# Patient Record
Sex: Male | Born: 1985 | Race: White | Hispanic: No | Marital: Married | Smoking: Never smoker
Health system: Southern US, Community
[De-identification: ages and names within clinical notes are randomized; demographics above are authoritative.]

## PROBLEM LIST (undated history)

## (undated) DIAGNOSIS — J45909 Unspecified asthma, uncomplicated: Secondary | ICD-10-CM

## (undated) DIAGNOSIS — I8390 Asymptomatic varicose veins of unspecified lower extremity: Secondary | ICD-10-CM

## (undated) DIAGNOSIS — M5030 Other cervical disc degeneration, unspecified cervical region: Secondary | ICD-10-CM

## (undated) DIAGNOSIS — F419 Anxiety disorder, unspecified: Secondary | ICD-10-CM

## (undated) DIAGNOSIS — M1990 Unspecified osteoarthritis, unspecified site: Secondary | ICD-10-CM

## (undated) DIAGNOSIS — K625 Hemorrhage of anus and rectum: Secondary | ICD-10-CM

## (undated) DIAGNOSIS — M549 Dorsalgia, unspecified: Secondary | ICD-10-CM

## (undated) DIAGNOSIS — F909 Attention-deficit hyperactivity disorder, unspecified type: Secondary | ICD-10-CM

## (undated) HISTORY — PX: LEG SURGERY: SHX1003

## (undated) HISTORY — PX: OTHER SURGICAL HISTORY: SHX169

## (undated) HISTORY — DX: Unspecified asthma, uncomplicated: J45.909

## (undated) HISTORY — DX: Attention-deficit hyperactivity disorder, unspecified type: F90.9

## (undated) HISTORY — PX: TONSILECTOMY, ADENOIDECTOMY, BILATERAL MYRINGOTOMY AND TUBES: SHX2538

## (undated) HISTORY — DX: Hemorrhage of anus and rectum: K62.5

## (undated) HISTORY — DX: Anxiety disorder, unspecified: F41.9

## (undated) HISTORY — DX: Dorsalgia, unspecified: M54.9

## (undated) HISTORY — DX: Asymptomatic varicose veins of unspecified lower extremity: I83.90

## (undated) HISTORY — DX: Other cervical disc degeneration, unspecified cervical region: M50.30

## (undated) HISTORY — PX: SHOULDER SURGERY: SHX246

## (undated) HISTORY — DX: Unspecified osteoarthritis, unspecified site: M19.90

---

## 2008-08-08 DIAGNOSIS — E669 Obesity, unspecified: Secondary | ICD-10-CM | POA: Insufficient documentation

## 2009-03-28 ENCOUNTER — Ambulatory Visit: Payer: Self-pay | Admitting: Family Medicine

## 2009-05-10 DIAGNOSIS — G43009 Migraine without aura, not intractable, without status migrainosus: Secondary | ICD-10-CM | POA: Insufficient documentation

## 2009-05-10 DIAGNOSIS — H3400 Transient retinal artery occlusion, unspecified eye: Secondary | ICD-10-CM | POA: Insufficient documentation

## 2015-04-03 ENCOUNTER — Telehealth: Payer: Self-pay | Admitting: Family Medicine

## 2015-04-03 NOTE — Telephone Encounter (Signed)
He should have one rx left to be filled on June 24th, 2016

## 2015-04-04 ENCOUNTER — Other Ambulatory Visit: Payer: Self-pay | Admitting: Family Medicine

## 2015-04-04 MED ORDER — AMPHETAMINE-DEXTROAMPHETAMINE 10 MG PO TABS: 10.0000 mg | ORAL_TABLET | Freq: Every day | ORAL | Status: DC

## 2015-04-04 NOTE — Telephone Encounter (Signed)
Done, at your desk. Please notify patient, thank you

## 2015-04-04 NOTE — Telephone Encounter (Signed)
Patient states he just needs a refill of the 10 mg of Adderall that he takes on his long days of work. He informed me work still has him doing 10-12 shifts right now and ran out of his prescription from February 21, 2015.

## 2015-05-17 ENCOUNTER — Ambulatory Visit: Payer: Self-pay | Admitting: Family Medicine

## 2015-05-19 ENCOUNTER — Telehealth: Payer: Self-pay | Admitting: Family Medicine

## 2015-05-19 NOTE — Telephone Encounter (Signed)
Requesting refill on Adderall 10mg . Patient is completely out

## 2015-05-22 ENCOUNTER — Other Ambulatory Visit: Payer: Self-pay

## 2015-05-24 NOTE — Telephone Encounter (Signed)
Left vm, pt is to schedule an appt and be seen before Dr. Ancil Boozer will refill

## 2015-05-30 ENCOUNTER — Encounter: Payer: Self-pay | Admitting: Family Medicine

## 2015-05-30 DIAGNOSIS — G8929 Other chronic pain: Secondary | ICD-10-CM | POA: Insufficient documentation

## 2015-05-30 DIAGNOSIS — M549 Dorsalgia, unspecified: Secondary | ICD-10-CM

## 2015-05-30 DIAGNOSIS — I8390 Asymptomatic varicose veins of unspecified lower extremity: Secondary | ICD-10-CM | POA: Insufficient documentation

## 2015-05-30 DIAGNOSIS — I872 Venous insufficiency (chronic) (peripheral): Secondary | ICD-10-CM | POA: Insufficient documentation

## 2015-05-30 DIAGNOSIS — R760 Raised antibody titer: Secondary | ICD-10-CM | POA: Insufficient documentation

## 2015-05-30 DIAGNOSIS — F988 Other specified behavioral and emotional disorders with onset usually occurring in childhood and adolescence: Secondary | ICD-10-CM | POA: Insufficient documentation

## 2015-05-30 DIAGNOSIS — I83892 Varicose veins of left lower extremities with other complications: Secondary | ICD-10-CM | POA: Insufficient documentation

## 2015-05-30 HISTORY — DX: Asymptomatic varicose veins of unspecified lower extremity: I83.90

## 2015-06-01 ENCOUNTER — Ambulatory Visit: Payer: Self-pay | Admitting: Family Medicine

## 2015-06-02 ENCOUNTER — Ambulatory Visit (INDEPENDENT_AMBULATORY_CARE_PROVIDER_SITE_OTHER): Payer: BLUE CROSS/BLUE SHIELD | Admitting: Family Medicine

## 2015-06-02 ENCOUNTER — Encounter: Payer: Self-pay | Admitting: Family Medicine

## 2015-06-02 VITALS — BP 124/84 | HR 82 | Temp 98.5°F | Resp 16 | Ht 74.0 in | Wt 265.5 lb

## 2015-06-02 DIAGNOSIS — G8929 Other chronic pain: Secondary | ICD-10-CM | POA: Diagnosis not present

## 2015-06-02 DIAGNOSIS — F410 Panic disorder [episodic paroxysmal anxiety] without agoraphobia: Secondary | ICD-10-CM | POA: Diagnosis not present

## 2015-06-02 DIAGNOSIS — F988 Other specified behavioral and emotional disorders with onset usually occurring in childhood and adolescence: Secondary | ICD-10-CM

## 2015-06-02 DIAGNOSIS — M549 Dorsalgia, unspecified: Secondary | ICD-10-CM | POA: Diagnosis not present

## 2015-06-02 DIAGNOSIS — F909 Attention-deficit hyperactivity disorder, unspecified type: Secondary | ICD-10-CM

## 2015-06-02 DIAGNOSIS — F430 Acute stress reaction: Secondary | ICD-10-CM

## 2015-06-02 MED ORDER — AMPHETAMINE-DEXTROAMPHET ER 25 MG PO CP24: 25.0000 mg | ORAL_CAPSULE | ORAL | Status: DC

## 2015-06-02 MED ORDER — CYCLOBENZAPRINE HCL 10 MG PO TABS: 10.0000 mg | ORAL_TABLET | ORAL | Status: DC | PRN

## 2015-06-02 MED ORDER — ALPRAZOLAM 0.5 MG PO TABS: 0.5000 mg | ORAL_TABLET | Freq: Every evening | ORAL | Status: DC | PRN

## 2015-06-02 MED ORDER — AMPHETAMINE-DEXTROAMPHETAMINE 10 MG PO TABS: 10.0000 mg | ORAL_TABLET | Freq: Every day | ORAL | Status: DC

## 2015-06-02 NOTE — Progress Notes (Signed)
Name: Christian Hamilton   MRN: [id number]    DOB: 1986/10/09   Date:06/02/2015       Progress Note  Subjective  Chief Complaint  Chief Complaint  Patient presents with  . ADHD  . Anxiety  . Medication Refill    HPI    Back pain: doing better, he states he got orthotic shoes and also lifting better at work, taking Flexeril prn only, occasionally takes NSAID's, denies radiculitis.   ADD: he is taking Adderall XR in am's and Adderall 10 mg at lunch, doing well with focus at work and at home, denies side effects, no tics, no significant weight loss.  Panic attacks: seldom now, taking alprazolam about three times weekly for anxiety , denies side effects  Patient Active Problem List   Diagnosis Date Noted  . Panic attack as reaction to stress 06/02/2015  . ADD (attention deficit disorder) 05/30/2015  . Back pain, chronic 05/30/2015  . Chronic venous insufficiency 05/30/2015  . Abnormal antinuclear antibody titer 05/30/2015  . Leg varices 05/30/2015  . Migraine without aura and responsive to treatment 05/10/2009  . 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 Obesity (BMI 30-39.9) 08/08/2008    Past Surgical History  Procedure Laterality Date  . Shoulder surgery    . Leg surgery    . Tonsilectomy, adenoidectomy, bilateral myringotomy and tubes    . Tubes in ears      Family History  Problem Relation Age of Onset  . Kidney disease Father   . ADD / ADHD Brother     History   Social History  . Marital Status: Married    Spouse Name: N/A  . Number of Children: N/A  . Years of Education: N/A   Occupational History  . Not on file.   Social History Main Topics  . Smoking status: Never Smoker   . Smokeless tobacco: Never Used  . Alcohol Use: 0.0 oz/week    0 Standard drinks or equivalent per week  . Drug Use: No  . Sexual Activity: Yes   Other Topics Concern  . Not on file   Social History Narrative  . No narrative on file     Current outpatient  prescriptions:  .  cyclobenzaprine (FLEXERIL) 10 MG tablet, Take 1 tablet (10 mg total) by mouth as needed., Disp: 30 tablet, Rfl: 2 .  ALPRAZolam (XANAX) 0.5 MG tablet, , Disp: , Rfl: 0 .  amphetamine-dextroamphetamine (ADDERALL XR) 25 MG 24 hr capsule, Take 1 capsule by mouth every morning., Disp: 30 capsule, Rfl: 0 .  amphetamine-dextroamphetamine (ADDERALL XR) 25 MG 24 hr capsule, Take 1 capsule by mouth every morning., Disp: 30 capsule, Rfl: 0 .  amphetamine-dextroamphetamine (ADDERALL XR) 25 MG 24 hr capsule, Take 1 capsule by mouth every morning., Disp: 30 capsule, Rfl: 0 .  amphetamine-dextroamphetamine (ADDERALL) 10 MG tablet, Take 1 tablet (10 mg total) by mouth daily with lunch., Disp: 30 tablet, Rfl: 0 .  amphetamine-dextroamphetamine (ADDERALL) 10 MG tablet, Take 1 tablet (10 mg total) by mouth daily with lunch., Disp: 30 tablet, Rfl: 0 .  amphetamine-dextroamphetamine (ADDERALL) 10 MG tablet, Take 1 tablet (10 mg total) by mouth daily with lunch., Disp: 30 tablet, Rfl: 0  No Known Allergies   ROS  Constitutional: Negative for fever or significant weight change.  Respiratory: Negative for cough and shortness of breath.   Cardiovascular: Negative for chest pain or palpitations.  Gastrointestinal: Negative for abdominal pain, no bowel changes.  Musculoskeletal: Negative for gait problem or  joint swelling.  Skin: Negative for rash.  Neurological: Negative for dizziness or headache.  No other specific complaints in a complete review of systems (except as listed in HPI above).  Objective  Filed Vitals:   06/02/15 0826  BP: 124/84  Pulse: 82  Temp: 98.5 F (36.9 C)  TempSrc: Oral  Resp: 16  Height: 6\' 2"  (1.88 m)  Weight: 265 lb 8 oz (120.43 kg)  SpO2: 97%    Body mass index is 34.07 kg/(m^2).  Physical Exam Constitutional: Patient appears well-developed and well-nourished. Obese No distress.  Eyes:  No scleral icterus. PERL Neck: Normal range of motion. Neck  supple. Cardiovascular: Normal rate, regular rhythm and normal heart sounds.  No murmur heard. No BLE edema. Varicose veins on left leg, mild on the right side Pulmonary/Chest: Effort normal and breath sounds normal. No respiratory distress. Abdominal: Soft.  There is no tenderness. Psychiatric: Patient has a normal mood and affect. behavior is normal. Judgment and thought content normal. Muscular Skeletal: normal ROM and no tenderness during palpation of back today   PHQ2/9: Depression screen PHQ 2/9 06/02/2015  Decreased Interest 0  Down, Depressed, Hopeless 0  PHQ - 2 Score 0    Fall Risk: Fall Risk  06/02/2015  Falls in the past year? No    Assessment & Plan  1. ADD (attention deficit disorder) Doing well  - amphetamine-dextroamphetamine (ADDERALL) 10 MG tablet; Take 1 tablet (10 mg total) by mouth daily with lunch.  Dispense: 30 tablet; Refill: 0 - amphetamine-dextroamphetamine (ADDERALL) 10 MG tablet; Take 1 tablet (10 mg total) by mouth daily with lunch.  Dispense: 30 tablet; Refill: 0 - amphetamine-dextroamphetamine (ADDERALL) 10 MG tablet; Take 1 tablet (10 mg total) by mouth daily with lunch.  Dispense: 30 tablet; Refill: 0 - amphetamine-dextroamphetamine (ADDERALL XR) 25 MG 24 hr capsule; Take 1 capsule by mouth every morning.  Dispense: 30 capsule; Refill: 0 - amphetamine-dextroamphetamine (ADDERALL XR) 25 MG 24 hr capsule; Take 1 capsule by mouth every morning.  Dispense: 30 capsule; Refill: 0 - amphetamine-dextroamphetamine (ADDERALL XR) 25 MG 24 hr capsule; Take 1 capsule by mouth every morning.  Dispense: 30 capsule; Refill: 0  2. Back pain, chronic  - cyclobenzaprine (FLEXERIL) 10 MG tablet; Take 1 tablet (10 mg total) by mouth as needed.  Dispense: 30 tablet; Refill: 2  3. Panic attack as reaction to stress Continue prn medication

## 2015-07-04 ENCOUNTER — Telehealth: Payer: Self-pay | Admitting: Family Medicine

## 2015-07-04 NOTE — Telephone Encounter (Signed)
Patient states that Adderrall is needing authorization

## 2015-07-06 NOTE — Telephone Encounter (Signed)
PA is being processed by insurance, once we receive a determination, I will notify the patient. It its pending at this time.

## 2015-07-16 ENCOUNTER — Other Ambulatory Visit: Payer: Self-pay | Admitting: Family Medicine

## 2015-07-26 ENCOUNTER — Ambulatory Visit (INDEPENDENT_AMBULATORY_CARE_PROVIDER_SITE_OTHER): Payer: BLUE CROSS/BLUE SHIELD | Admitting: Family Medicine

## 2015-07-26 ENCOUNTER — Encounter: Payer: Self-pay | Admitting: Family Medicine

## 2015-07-26 VITALS — BP 132/86 | HR 96 | Temp 99.0°F | Resp 14 | Ht 74.0 in | Wt 266.1 lb

## 2015-07-26 DIAGNOSIS — Z0000 Encounter for general adult medical examination without abnormal findings: Secondary | ICD-10-CM

## 2015-07-26 DIAGNOSIS — E669 Obesity, unspecified: Secondary | ICD-10-CM

## 2015-07-26 DIAGNOSIS — Z23 Encounter for immunization: Secondary | ICD-10-CM

## 2015-07-26 DIAGNOSIS — Z79899 Other long term (current) drug therapy: Secondary | ICD-10-CM | POA: Diagnosis not present

## 2015-07-26 DIAGNOSIS — Z13220 Encounter for screening for lipoid disorders: Secondary | ICD-10-CM

## 2015-07-26 NOTE — Progress Notes (Signed)
Name: Christian Hamilton   MRN: [id number]    DOB: [birth year]   Date:07/26/2015       Progress Note  Subjective  Chief Complaint  Chief Complaint  Patient presents with  . Annual Exam    HPI  Annual Exam: feeling well, no chest pain, no SOB, no ED, no urinary frequency.   Patient Active Problem List   Diagnosis Date Noted  . Panic attack as reaction to stress 06/02/2015  . ADD (attention deficit disorder) 05/30/2015  . Back pain, chronic 05/30/2015  . Chronic venous insufficiency 05/30/2015  . Abnormal antinuclear antibody titer 05/30/2015  . Leg varices 05/30/2015  . Migraine without aura and responsive to treatment 05/10/2009  . 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 Obesity (BMI 30-39.9) 08/08/2008    Past Surgical History  Procedure Laterality Date  . Shoulder surgery    . Leg surgery    . Tonsilectomy, adenoidectomy, bilateral myringotomy and tubes    . Tubes in ears      Family History  Problem Relation Age of Onset  . Kidney disease Father   . ADD / ADHD Brother     Social History   Social History  . Marital Status: Married    Spouse Name: N/A  . Number of Children: N/A  . Years of Education: N/A   Occupational History  . Not on file.   Social History Main Topics  . Smoking status: Never Smoker   . Smokeless tobacco: Never Used  . Alcohol Use: 10.8 oz/week    0 Standard drinks or equivalent, 18 Cans of beer per week  . Drug Use: No  . Sexual Activity: Yes   Other Topics Concern  . Not on file   Social History Narrative     Current outpatient prescriptions:  .  ALPRAZolam (XANAX) 0.5 MG tablet, TAKE 1 TABLET BY MOUTH AT BEDTIME AS NEEDED FOR ANXIETY, Disp: 30 tablet, Rfl: 0 .  amphetamine-dextroamphetamine (ADDERALL XR) 25 MG 24 hr capsule, Take 1 capsule by mouth every morning., Disp: 30 capsule, Rfl: 0 .  amphetamine-dextroamphetamine (ADDERALL XR) 25 MG 24 hr capsule, Take 1 capsule by mouth every morning., Disp: 30 capsule, Rfl:  0 .  amphetamine-dextroamphetamine (ADDERALL XR) 25 MG 24 hr capsule, Take 1 capsule by mouth every morning., Disp: 30 capsule, Rfl: 0 .  amphetamine-dextroamphetamine (ADDERALL) 10 MG tablet, Take 1 tablet (10 mg total) by mouth daily with lunch., Disp: 30 tablet, Rfl: 0 .  amphetamine-dextroamphetamine (ADDERALL) 10 MG tablet, Take 1 tablet (10 mg total) by mouth daily with lunch., Disp: 30 tablet, Rfl: 0 .  amphetamine-dextroamphetamine (ADDERALL) 10 MG tablet, Take 1 tablet (10 mg total) by mouth daily with lunch., Disp: 30 tablet, Rfl: 0 .  cyclobenzaprine (FLEXERIL) 10 MG tablet, Take 1 tablet (10 mg total) by mouth as needed., Disp: 30 tablet, Rfl: 2  No Known Allergies   ROS  Constitutional: Negative for fever or weight change.  Respiratory: Negative for cough and shortness of breath.   Cardiovascular: Negative for chest pain or palpitations.  Gastrointestinal: Negative for abdominal pain, no bowel changes.  Musculoskeletal: Negative for gait problem or joint swelling.  Skin: Negative for rash.  Neurological: Negative for dizziness or headache.  No other specific complaints in a complete review of systems (except as listed in HPI above).  Objective  Filed Vitals:   07/26/15 1154 07/26/15 1226  BP: 132/86   Pulse: 127 96  Temp: 99 F (37.2 C)   TempSrc: Oral  Resp: 14   Height: 6\' 2"  (1.88 m)   Weight: 266 lb 1.6 oz (120.702 kg)   SpO2: 97%     Body mass index is 34.15 kg/(m^2).  Physical Exam  Constitutional: Patient appears well-developed and obese No distress.  HENT: Head: Normocephalic and atraumatic. Ears: B TMs ok, no erythema or effusion; Nose: Nose normal. Mouth/Throat: Oropharynx is clear and moist. No oropharyngeal exudate.  Eyes: Conjunctivae and EOM are normal. Pupils are equal, round, and reactive to light. No scleral icterus.  Neck: Normal range of motion. Neck supple. No JVD present. No thyromegaly present.  Cardiovascular: Normal rate, regular  rhythm and normal heart sounds.  No murmur heard. No BLE edema. Pulmonary/Chest: Effort normal and breath sounds normal. No respiratory distress. Abdominal: Soft. Bowel sounds are normal, no distension. There is no tenderness. no masses MALE GENITALIA: Normal descended testes bilaterally, no masses palpated, no hernias, no lesions, no discharge RECTAL: not done Musculoskeletal: Normal range of motion, no joint effusions. No gross deformities Neurological: he is alert and oriented to person, place, and time. No cranial nerve deficit. Coordination, balance, strength, speech and gait are normal.  Skin: Skin is warm and dry. No rash noted. 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 behavior is normal. Judgment and thought content normal.   PHQ2/9: Depression screen PHQ 2/9 06/02/2015  Decreased Interest 0  Down, Depressed, Hopeless 0  PHQ - 2 Score 0     Fall Risk: Fall Risk  06/02/2015  Falls in the past year? No     Assessment & Plan  1. Encounter for routine history and physical exam for male Discussed importance of 150 minutes of physical activity weekly, eat two servings of fish weekly, eat one serving of tree nuts ( cashews, pistachios, pecans, almonds.Marland Kitchen) every other day, eat 6 servings of fruit/vegetables daily and drink plenty of water and avoid sweet beverages.   2. Needs flu shot  - Flu Vaccine QUAD 36+ mos PF IM (Fluarix & Fluzone Quad PF) - he wants to come back for it  3. Obesity (BMI 30-39.9) Discussed with the patient the risk posed by an increased BMI. Discussed importance of portion control, calorie counting and at least 150 minutes of physical activity weekly. Avoid sweet beverages and drink more water. Eat at least 6 servings of fruit and vegetables daily

## 2015-08-29 ENCOUNTER — Ambulatory Visit: Payer: BLUE CROSS/BLUE SHIELD | Admitting: Family Medicine

## 2015-10-12 ENCOUNTER — Encounter: Payer: Self-pay | Admitting: Family Medicine

## 2015-10-12 ENCOUNTER — Ambulatory Visit (INDEPENDENT_AMBULATORY_CARE_PROVIDER_SITE_OTHER): Payer: BLUE CROSS/BLUE SHIELD | Admitting: Family Medicine

## 2015-10-12 VITALS — BP 130/86 | HR 99 | Temp 98.4°F | Resp 18 | Ht 74.0 in | Wt 278.1 lb

## 2015-10-12 DIAGNOSIS — M542 Cervicalgia: Secondary | ICD-10-CM | POA: Insufficient documentation

## 2015-10-12 DIAGNOSIS — F430 Acute stress reaction: Secondary | ICD-10-CM

## 2015-10-12 DIAGNOSIS — G8929 Other chronic pain: Secondary | ICD-10-CM | POA: Diagnosis not present

## 2015-10-12 DIAGNOSIS — F909 Attention-deficit hyperactivity disorder, unspecified type: Secondary | ICD-10-CM | POA: Diagnosis not present

## 2015-10-12 DIAGNOSIS — E669 Obesity, unspecified: Secondary | ICD-10-CM

## 2015-10-12 DIAGNOSIS — M549 Dorsalgia, unspecified: Secondary | ICD-10-CM

## 2015-10-12 DIAGNOSIS — F410 Panic disorder [episodic paroxysmal anxiety] without agoraphobia: Secondary | ICD-10-CM | POA: Diagnosis not present

## 2015-10-12 DIAGNOSIS — F988 Other specified behavioral and emotional disorders with onset usually occurring in childhood and adolescence: Secondary | ICD-10-CM

## 2015-10-12 MED ORDER — CYCLOBENZAPRINE HCL 10 MG PO TABS: 10.0000 mg | ORAL_TABLET | ORAL | Status: DC | PRN

## 2015-10-12 MED ORDER — LISDEXAMFETAMINE DIMESYLATE 40 MG PO CAPS: 40.0000 mg | ORAL_CAPSULE | ORAL | Status: DC

## 2015-10-12 MED ORDER — ALPRAZOLAM 0.5 MG PO TABS: 0.5000 mg | ORAL_TABLET | Freq: Two times a day (BID) | ORAL | Status: DC | PRN

## 2015-10-12 NOTE — Progress Notes (Signed)
Name: Christian Hamilton   MRN: [id number]    DOB: 12-08-1985   Date:10/12/2015       Progress Note  Subjective  Chief Complaint  Chief Complaint  Patient presents with  . Medication Refill    follow-up  . ADHD  . Anxiety    HPI  Back pain: doing better, he states he got orthotic shoes and also lifting better at work, taking Flexeril prn only, occasionally takes NSAID's, denies radiculitis at this time.    Neck pain : he has noticed a sharp or dull aching sensation on C7 area, no tingling or numbness or numbness, denies weakness on his arms. He takes Flexeril and ibuprofen  prn and seems to improve symptoms. He is thinking about chiropractor manipulation.   ADD: he is taking Adderall XR in am's and Adderall 10 mg at lunch, doing well with focus at work and at home, however the generic form causes headache, that lasted for hours. He noticed that once he skipped the medication the headache was not present. He would like to try a different medication at this time.   Panic attacks: seldom now, taking alprazolam about three times weekly for anxiety , denies side effects. He moved to a less stressful position, working only 40 hours weekly.   Obesity: he has gained some weight since last visit. He has not been compliant with his diet lately because of the holidays, he has also been more sedentary with time off work and working less hours.    Patient Active Problem List   Diagnosis Date Noted  . Neck pain 10/12/2015  . Panic attack as reaction to stress 06/02/2015  . ADD (attention deficit disorder) 05/30/2015  . Back pain, chronic 05/30/2015  . Chronic venous insufficiency 05/30/2015  . Abnormal antinuclear antibody titer 05/30/2015  . Leg varices 05/30/2015  . Migraine without aura and responsive to treatment 05/10/2009  . 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 Obesity (BMI 30-39.9) 08/08/2008    Past Surgical History  Procedure Laterality Date  . Shoulder surgery    .  Leg surgery    . Tonsilectomy, adenoidectomy, bilateral myringotomy and tubes    . Tubes in ears      Family History  Problem Relation Age of Onset  . Kidney disease Father   . ADD / ADHD Brother     Social History   Social History  . Marital Status: Married    Spouse Name: N/A  . Number of Children: N/A  . Years of Education: N/A   Occupational History  . Not on file.   Social History Main Topics  . Smoking status: Never Smoker   . Smokeless tobacco: Never Used  . Alcohol Use: 10.8 oz/week    0 Standard drinks or equivalent, 18 Cans of beer per week  . Drug Use: No  . Sexual Activity: Yes   Other Topics Concern  . Not on file   Social History Narrative     Current outpatient prescriptions:  .  ALPRAZolam (XANAX) 0.5 MG tablet, Take 1 tablet (0.5 mg total) by mouth 2 (two) times daily as needed for anxiety., Disp: 30 tablet, Rfl: 0 .  cyclobenzaprine (FLEXERIL) 10 MG tablet, Take 1 tablet (10 mg total) by mouth as needed., Disp: 30 tablet, Rfl: 2 .  lisdexamfetamine (VYVANSE) 40 MG capsule, Take 1 capsule (40 mg total) by mouth every morning., Disp: 30 capsule, Rfl: 0  No Known Allergies   ROS  Constitutional: Negative for fever ,  positive for weight change.  Respiratory: Negative for cough and shortness of breath.   Cardiovascular: Negative for chest pain or palpitations.  Gastrointestinal: Negative for abdominal pain, no bowel changes.  Musculoskeletal: Negative for gait problem or joint swelling.  Skin: Negative for rash.  Neurological: Negative for dizziness, intermittent headache.  No other specific complaints in a complete review of systems (except as listed in HPI above).  Objective  Filed Vitals:   10/12/15 1558  BP: 130/86  Pulse: 99  Temp: 98.4 F (36.9 C)  TempSrc: Oral  Resp: 18  Height: 6\' 2"  (1.88 m)  Weight: 278 lb 1.6 oz (126.145 kg)  SpO2: 95%    Body mass index is 35.69 kg/(m^2).  Physical Exam  Constitutional: Patient  appears well-developed and well-nourished. Obese No distress.  HEENT: head atraumatic, normocephalic, pupils equal and reactive to light, neck supple, throat within normal limits Cardiovascular: Normal rate, regular rhythm and normal heart sounds.  No murmur heard. No BLE edema. Pulmonary/Chest: Effort normal and breath sounds normal. No respiratory distress. Abdominal: Soft.  There is no tenderness. Psychiatric: Patient has a normal mood and affect. behavior is normal. Judgment and thought content normal. Muscular Skeletal: normal low back pain, pain during palpation of cervical spine, but normal rom.   PHQ2/9: Depression screen [facility] 2/9 10/12/2015 06/02/2015  Decreased Interest 0 0  Down, Depressed, Hopeless 0 0  PHQ - 2 Score 0 0    Fall Risk: Fall Risk  10/12/2015 06/02/2015  Falls in the past year? No No     Functional Status Survey: Is the patient deaf or have difficulty hearing?: No Does the patient have difficulty seeing, even when wearing glasses/contacts?: No Does the patient have difficulty concentrating, remembering, or making decisions?: No Does the patient have difficulty walking or climbing stairs?: No Does the patient have difficulty dressing or bathing?: No Does the patient have difficulty doing errands alone such as visiting a doctor's office or shopping?: No    Assessment & Plan  1. ADD (attention deficit disorder)  We will try vyvanse - lisdexamfetamine (VYVANSE) 40 MG capsule; Take 1 capsule (40 mg total) by mouth every morning.  Dispense: 30 capsule; Refill: 0  2. Back pain, chronic  Continue prn medication, doing well at this time - cyclobenzaprine (FLEXERIL) 10 MG tablet; Take 1 tablet (10 mg total) by mouth as needed.  Dispense: 30 tablet; Refill: 2  3. Obesity (BMI 30-39.9)  Discussed importance of 150 minutes of physical activity weekly, eat two servings of fish weekly, eat one serving of tree nuts ( cashews, pistachios, pecans, almonds.Marland Kitchen) every  other day, eat 6 servings of fruit/vegetables daily and drink plenty of water and avoid sweet beverages.   4. Panic attack as reaction to stress  - ALPRAZolam (XANAX) 0.5 MG tablet; Take 1 tablet (0.5 mg total) by mouth 2 (two) times daily as needed for anxiety.  Dispense: 30 tablet; Refill: 0  5. Neck pain  Continue prn medication

## 2015-10-25 ENCOUNTER — Telehealth: Payer: Self-pay

## 2015-10-25 NOTE — Telephone Encounter (Signed)
Patient was taking the extended release with the instant release Adderall and due to Insurance they move the brand name up in tier and the generic would give him headaches. So Dr. Ancil Boozer wanted patient to try Vyvanse to see if that would help and patient states he can't tell a difference taking it, also is headache with it.  Patient is requesting to see if you could switch him back to the name brand Adderall since it worked the best for him.

## 2015-10-25 NOTE — Telephone Encounter (Signed)
Yes. Please verify the dose and strength of the brand Adderal before I send refil

## 2015-10-26 MED ORDER — AMPHETAMINE-DEXTROAMPHETAMINE 10 MG PO TABS: 10.0000 mg | ORAL_TABLET | Freq: Two times a day (BID) | ORAL | Status: DC

## 2015-10-26 MED ORDER — AMPHETAMINE-DEXTROAMPHET ER 25 MG PO CP24: 25.0000 mg | ORAL_CAPSULE | ORAL | Status: DC

## 2015-10-26 NOTE — Telephone Encounter (Signed)
Patient states he was on Brand Adderall XR 25 mg 1 tablet and Adderall 10 mg instant release 1 tablet daily.

## 2015-10-26 NOTE — Telephone Encounter (Signed)
Done He needs to drop off the Vyvanse pills he has at home before he can get new prescriptions. Thank you

## 2015-10-26 NOTE — Telephone Encounter (Signed)
Patient notified

## 2015-10-26 NOTE — Addendum Note (Signed)
Addended by: Steele Sizer F on: 10/26/2015 01:29 PM   Modules accepted: Orders, Medications

## 2016-01-30 ENCOUNTER — Encounter: Payer: Self-pay | Admitting: Family Medicine

## 2016-01-30 ENCOUNTER — Ambulatory Visit (INDEPENDENT_AMBULATORY_CARE_PROVIDER_SITE_OTHER): Payer: BLUE CROSS/BLUE SHIELD | Admitting: Family Medicine

## 2016-01-30 VITALS — BP 136/72 | HR 102 | Temp 98.2°F | Resp 16 | Ht 74.0 in | Wt 288.9 lb

## 2016-01-30 DIAGNOSIS — F909 Attention-deficit hyperactivity disorder, unspecified type: Secondary | ICD-10-CM | POA: Diagnosis not present

## 2016-01-30 DIAGNOSIS — F988 Other specified behavioral and emotional disorders with onset usually occurring in childhood and adolescence: Secondary | ICD-10-CM

## 2016-01-30 DIAGNOSIS — L989 Disorder of the skin and subcutaneous tissue, unspecified: Secondary | ICD-10-CM

## 2016-01-30 MED ORDER — AMPHETAMINE-DEXTROAMPHETAMINE 10 MG PO TABS: 10.0000 mg | ORAL_TABLET | Freq: Two times a day (BID) | ORAL | Status: DC

## 2016-01-30 MED ORDER — AMPHETAMINE-DEXTROAMPHET ER 25 MG PO CP24: 25.0000 mg | ORAL_CAPSULE | ORAL | Status: DC

## 2016-01-30 MED ORDER — AMPHETAMINE-DEXTROAMPHETAMINE 10 MG PO TABS
10.0000 mg | ORAL_TABLET | Freq: Two times a day (BID) | ORAL | Status: DC
Start: 2016-01-30 — End: 2016-07-25

## 2016-01-30 NOTE — Progress Notes (Signed)
Name: Christian Hamilton   MRN: [id number]    DOB: [birth year]   Date:01/30/2016       Progress Note  Subjective  Chief Complaint  Chief Complaint  Patient presents with  . Hand Pain    right index finger has a place on it to look at  . Medication Refill    HPI  Neck pain : he has noticed a sharp or dull aching sensation on C7 area, no tingling or numbness or numbness, denies weakness on his arms. He takes Flexeril and ibuprofen prn and seems to improve symptoms. He stopped looking down on cell phone and symptoms improved  ADD: he is taking Adderall XR in am's and Adderall 10 mg at lunch, doing well with focus at work and at home. He states the headaches stopped  Panic attacks: seldom now, taking alprazolam about three times weekly for anxiety , denies side effects. He states that he is working less hours, but more stressful because he is working more in the [employer] and is gaining weight because he is job has been more sedentary.   Lesion right index finger: he states he is not sure what happened, but about one month ago he noticed a growth on the left index finger. It is very friable and bleeds when he bumps into it. Very seldom has pain. He used wart removal at home and it went down but has grown in size again.   Patient Active Problem List   Diagnosis Date Noted  . Neck pain 10/12/2015  . Panic attack as reaction to stress 06/02/2015  . ADD (attention deficit disorder) 05/30/2015  . Back pain, chronic 05/30/2015  . Chronic venous insufficiency 05/30/2015  . Abnormal antinuclear antibody titer 05/30/2015  . Leg varices 05/30/2015  . Migraine without aura and responsive to treatment 05/10/2009  . 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 Obesity (BMI 30-39.9) 08/08/2008    Past Surgical History  Procedure Laterality Date  . Shoulder surgery    . Leg surgery    . Tonsilectomy, adenoidectomy, bilateral myringotomy and tubes    . Tubes in ears       Family History  Problem Relation Age of Onset  . Kidney disease Father   . ADD / ADHD Brother     Social History   Social History  . Marital Status: Married    Spouse Name: N/A  . Number of Children: N/A  . Years of Education: N/A   Occupational History  . Not on file.   Social History Main Topics  . Smoking status: Never Smoker   . Smokeless tobacco: Never Used  . Alcohol Use: 10.8 oz/week    0 Standard drinks or equivalent, 18 Cans of beer per week  . Drug Use: No  . Sexual Activity: Yes   Other Topics Concern  . Not on file   Social History Narrative     Current outpatient prescriptions:  .  ALPRAZolam (XANAX) 0.5 MG tablet, Take 1 tablet (0.5 mg total) by mouth 2 (two) times daily as needed for anxiety., Disp: 30 tablet, Rfl: 0 .  amphetamine-dextroamphetamine (ADDERALL XR) 25 MG 24 hr capsule, Take 1 capsule by mouth every morning., Disp: 30 capsule, Rfl: 0 .  amphetamine-dextroamphetamine (ADDERALL XR) 25 MG 24 hr capsule, Take 1 capsule by mouth every morning. Fill January 27th, 2017, Disp: 30 capsule, Rfl: 0 .  amphetamine-dextroamphetamine (ADDERALL XR) 25 MG 24 hr capsule, Take 1 capsule by mouth every morning., Disp:  30 capsule, Rfl: 0 .  amphetamine-dextroamphetamine (ADDERALL) 10 MG tablet, Take 1 tablet (10 mg total) by mouth 2 (two) times daily., Disp: 60 tablet, Rfl: 0 .  amphetamine-dextroamphetamine (ADDERALL) 10 MG tablet, Take 1 tablet (10 mg total) by mouth 2 (two) times daily., Disp: 60 tablet, Rfl: 0 .  amphetamine-dextroamphetamine (ADDERALL) 10 MG tablet, Take 1 tablet (10 mg total) by mouth 2 (two) times daily. Fill March 26 th, 2017, Disp: 60 tablet, Rfl: 0 .  cyclobenzaprine (FLEXERIL) 10 MG tablet, Take 1 tablet (10 mg total) by mouth as needed., Disp: 30 tablet, Rfl: 2  No Known Allergies   ROS  Ten systems reviewed and is negative except as mentioned in HPI   Objective  Filed Vitals:   01/30/16 1437  BP: 136/72  Pulse: 102   Temp: 98.2 F (36.8 C)  TempSrc: Oral  Resp: 16  Height: 6\' 2"  (1.88 m)  Weight: 288 lb 14.4 oz (131.044 kg)  SpO2: 97%    Body mass index is 37.08 kg/(m^2).  Physical Exam  Constitutional: Patient appears well-developed and well-nourished. Obese  No distress.  HEENT: head atraumatic, normocephalic, pupils equal and reactive to light,  neck supple, throat within normal limits Cardiovascular: Normal rate, regular rhythm and normal heart sounds.  No murmur heard. No BLE edema. Pulmonary/Chest: Effort normal and breath sounds normal. No respiratory distress. Abdominal: Soft.  There is no tenderness. Psychiatric: Patient has a normal mood and affect. behavior is normal. Judgment and thought content normal. Skin: erythematous a friable growth on the right ventral aspect of index finger.   PHQ2/9: Depression screen [facility] 2/9 01/30/2016 10/12/2015 06/02/2015  Decreased Interest 0 0 0  Down, Depressed, Hopeless 0 0 0  PHQ - 2 Score 0 0 0     Fall Risk: Fall Risk  01/30/2016 10/12/2015 06/02/2015  Falls in the past year? No No No      Functional Status Survey: Is the patient deaf or have difficulty hearing?: No Does the patient have difficulty seeing, even when wearing glasses/contacts?: No Does the patient have difficulty concentrating, remembering, or making decisions?: No Does the patient have difficulty walking or climbing stairs?: No Does the patient have difficulty dressing or bathing?: No Does the patient have difficulty doing errands alone such as visiting a doctor's office or shopping?: No    Assessment & Plan  1. Finger lesion  Referral surgeon -[facility] - appointment scheduled for next week and patient was notified  2. ADD (attention deficit disorder)  - amphetamine-dextroamphetamine (ADDERALL) 10 MG tablet; Take 1 tablet (10 mg total) by mouth 2 (two) times daily. Fill  May 24th,  2017  Dispense: 60 tablet; Refill: 0 - amphetamine-dextroamphetamine (ADDERALL XR)  25 MG 24 hr capsule; Take 1 capsule by mouth every morning.  Dispense: 30 capsule; Refill: 0 - amphetamine-dextroamphetamine (ADDERALL XR) 25 MG 24 hr capsule; Take 1 capsule by mouth every morning. Fill January 27th, 2017  Dispense: 30 capsule; Refill: 0 - amphetamine-dextroamphetamine (ADDERALL XR) 25 MG 24 hr capsule; Take 1 capsule by mouth every morning.  Dispense: 30 capsule; Refill: 0 - amphetamine-dextroamphetamine (ADDERALL) 10 MG tablet; Take 1 tablet (10 mg total) by mouth 2 (two) times daily.  Dispense: 60 tablet; Refill: 0 - amphetamine-dextroamphetamine (ADDERALL) 10 MG tablet; Take 1 tablet (10 mg total) by mouth 2 (two) times daily.  Dispense: 60 tablet; Refill: 0

## 2016-07-25 ENCOUNTER — Ambulatory Visit (INDEPENDENT_AMBULATORY_CARE_PROVIDER_SITE_OTHER): Payer: BLUE CROSS/BLUE SHIELD | Admitting: Family Medicine

## 2016-07-25 ENCOUNTER — Encounter: Payer: Self-pay | Admitting: Family Medicine

## 2016-07-25 VITALS — BP 122/78 | HR 108 | Temp 98.6°F | Resp 16 | Ht 74.0 in | Wt 287.0 lb

## 2016-07-25 DIAGNOSIS — I861 Scrotal varices: Secondary | ICD-10-CM | POA: Insufficient documentation

## 2016-07-25 DIAGNOSIS — Z0000 Encounter for general adult medical examination without abnormal findings: Secondary | ICD-10-CM

## 2016-07-25 DIAGNOSIS — Z13220 Encounter for screening for lipoid disorders: Secondary | ICD-10-CM

## 2016-07-25 DIAGNOSIS — F410 Panic disorder [episodic paroxysmal anxiety] without agoraphobia: Secondary | ICD-10-CM | POA: Diagnosis not present

## 2016-07-25 DIAGNOSIS — Z131 Encounter for screening for diabetes mellitus: Secondary | ICD-10-CM

## 2016-07-25 DIAGNOSIS — Z79899 Other long term (current) drug therapy: Secondary | ICD-10-CM

## 2016-07-25 DIAGNOSIS — F430 Acute stress reaction: Secondary | ICD-10-CM

## 2016-07-25 DIAGNOSIS — F909 Attention-deficit hyperactivity disorder, unspecified type: Secondary | ICD-10-CM

## 2016-07-25 DIAGNOSIS — F988 Other specified behavioral and emotional disorders with onset usually occurring in childhood and adolescence: Secondary | ICD-10-CM

## 2016-07-25 LAB — COMPLETE METABOLIC PANEL WITH GFR
ALBUMIN: 4.4 g/dL (ref 3.6–5.1)
ALK PHOS: 47 U/L (ref 40–115)
ALT: 26 U/L (ref 9–46)
AST: 24 U/L (ref 10–40)
BUN: 10 mg/dL (ref 7–25)
CALCIUM: 9.3 mg/dL (ref 8.6–10.3)
CHLORIDE: 103 mmol/L (ref 98–110)
CO2: 27 mmol/L (ref 20–31)
CREATININE: 0.81 mg/dL (ref 0.60–1.35)
GFR, Est Non African American: >89 mL/min (ref >=60)
Glucose, Bld: 91 mg/dL (ref 65–99)
POTASSIUM: 4.1 mmol/L (ref 3.5–5.3)
Sodium: 140 mmol/L (ref 135–146)
Total Bilirubin: 0.8 mg/dL (ref 0.2–1.2)
Total Protein: 6.5 g/dL (ref 6.1–8.1)

## 2016-07-25 LAB — LIPID PANEL
CHOLESTEROL: 166 mg/dL (ref 125–200)
HDL: 53 mg/dL (ref >=40)
LDL CALC: 103 mg/dL (ref <130)
TRIGLYCERIDES: 52 mg/dL (ref <150)
Total CHOL/HDL Ratio: 3.1 Ratio (ref <=5.0)
VLDL: 10 mg/dL (ref <30)

## 2016-07-25 MED ORDER — AMPHETAMINE-DEXTROAMPHET ER 25 MG PO CP24: 25.0000 mg | ORAL_CAPSULE | ORAL | 0 refills | Status: DC

## 2016-07-25 MED ORDER — AMPHETAMINE-DEXTROAMPHETAMINE 10 MG PO TABS: 10.0000 mg | ORAL_TABLET | Freq: Two times a day (BID) | ORAL | 0 refills | Status: DC

## 2016-07-25 MED ORDER — ALPRAZOLAM 0.5 MG PO TABS
0.5000 mg | ORAL_TABLET | Freq: Every day | ORAL | 0 refills | Status: DC | PRN
Start: 2016-07-25 — End: 2016-11-01

## 2016-07-25 MED ORDER — AMPHETAMINE-DEXTROAMPHETAMINE 10 MG PO TABS: 10.0000 mg | ORAL_TABLET | Freq: Every day | ORAL | 0 refills | Status: DC

## 2016-07-25 NOTE — Progress Notes (Signed)
Name: Christian Hamilton   MRN: [id number]    DOB: 06-11-1986   Date:07/25/2016       Progress Note  Subjective  Chief Complaint  Chief Complaint  Patient presents with  . Annual Exam  . Medication Refill    HPI  Male exam: he is feeling well, weight has been stable, stress is under control. Denies urinary symptoms, no ED.   ADD: he is taking Adderall XR in am's and Adderall 10 mg at lunch, doing well with focus at work and at home. Medication does not affect his appetite or ability to sleep at night. No tics  Panic attacks: seldom now, taking alprazolam a couple times a week, denies side effects. Work has been more stable, currently with the same company but relocated to [facility].    Patient Active Problem List   Diagnosis Date Noted  . Neck pain 10/12/2015  . Panic attack as reaction to stress 06/02/2015  . ADD (attention deficit disorder) 05/30/2015  . Back pain, chronic 05/30/2015  . Chronic venous insufficiency 05/30/2015  . Abnormal antinuclear antibody titer 05/30/2015  . Leg varices 05/30/2015  . Migraine without aura and responsive to treatment 05/10/2009  . 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 Obesity (BMI 30-39.9) 08/08/2008    Past Surgical History:  Procedure Laterality Date  . LEG SURGERY    . SHOULDER SURGERY    . TONSILECTOMY, ADENOIDECTOMY, BILATERAL MYRINGOTOMY AND TUBES    . tubes in ears      Family History  Problem Relation Age of Onset  . Kidney disease Father   . ADD / ADHD Brother     Social History   Social History  . Marital status: Married    Spouse name: N/A  . Number of children: N/A  . Years of education: N/A   Occupational History  . Not on file.   Social History Main Topics  . Smoking status: Never Smoker  . Smokeless tobacco: Never Used  . Alcohol use 10.8 oz/week    18 Cans of beer per week  . Drug use: No  . Sexual activity: Yes   Other Topics Concern  . Not on file   Social History Narrative  . No  narrative on file     Current Outpatient Prescriptions:  .  ALPRAZolam (XANAX) 0.5 MG tablet, Take 1 tablet (0.5 mg total) by mouth daily as needed for anxiety., Disp: 30 tablet, Rfl: 0 .  amphetamine-dextroamphetamine (ADDERALL XR) 25 MG 24 hr capsule, Take 1 capsule by mouth every morning., Disp: 30 capsule, Rfl: 0 .  amphetamine-dextroamphetamine (ADDERALL XR) 25 MG 24 hr capsule, Take 1 capsule by mouth every morning. Fill January 27th, 2017, Disp: 30 capsule, Rfl: 0 .  amphetamine-dextroamphetamine (ADDERALL XR) 25 MG 24 hr capsule, Take 1 capsule by mouth every morning., Disp: 30 capsule, Rfl: 0 .  amphetamine-dextroamphetamine (ADDERALL) 10 MG tablet, Take 1 tablet (10 mg total) by mouth daily with lunch. Fill  08/23/2016, Disp: 30 tablet, Rfl: 0 .  amphetamine-dextroamphetamine (ADDERALL) 10 MG tablet, Take 1 tablet (10 mg total) by mouth daily with lunch., Disp: 30 tablet, Rfl: 0 .  amphetamine-dextroamphetamine (ADDERALL) 10 MG tablet, Take 1 tablet (10 mg total) by mouth 2 (two) times daily., Disp: 30 tablet, Rfl: 0 .  cyclobenzaprine (FLEXERIL) 10 MG tablet, Take 1 tablet (10 mg total) by mouth as needed., Disp: 30 tablet, Rfl: 2  No Known Allergies   ROS  Constitutional: Negative for fever or weight change.  Respiratory: Negative for cough and shortness of breath.   Cardiovascular: Negative for chest pain or palpitations.  Gastrointestinal: Negative for abdominal pain, no bowel changes.  Musculoskeletal: Negative for gait problem or joint swelling.  Skin: Negative for rash.  Neurological: Negative for dizziness or headache.  No other specific complaints in a complete review of systems (except as listed in HPI above).  Objective  Vitals:   07/25/16 1114  BP: 122/78  Pulse: (!) 108  Resp: 16  Temp: 98.6 F (37 C)  SpO2: 95%  Weight: 287 lb (130.2 kg)  Height: 6\' 2"  (1.88 m)    Body mass index is 36.85 kg/m.  Physical Exam   Constitutional: Patient appears  well-developed and well-nourished. No distress.  HENT: Head: Normocephalic and atraumatic. Ears: B TMs ok, no erythema or effusion; Nose: Nose normal. Mouth/Throat: Oropharynx is clear and moist. No oropharyngeal exudate.  Eyes: Conjunctivae and EOM are normal. Pupils are equal, round, and reactive to light. No scleral icterus.  Neck: Normal range of motion. Neck supple. No JVD present. No thyromegaly present.  Cardiovascular: Normal rate, regular rhythm and normal heart sounds.  No murmur heard. No BLE edema. Varicose vein left lower leg Pulmonary/Chest: Effort normal and breath sounds normal. No respiratory distress. Abdominal: Soft. Bowel sounds are normal, no distension. There is no tenderness. no masses MALE GENITALIA: Normal descended testes bilaterally,varcicocele bilaterally,  no hernias, no lesions, no discharge RECTAL: not done Musculoskeletal: Normal range of motion, no joint effusions. No gross deformities Neurological: he is alert and oriented to person, place, and time. No cranial nerve deficit. Coordination, balance, strength, speech and gait are normal.  Skin: Skin is warm and dry. No rash noted. No erythema.  Psychiatric: Patient has a normal mood and affect. behavior is normal. Judgment and thought content normal.  PHQ2/9: Depression screen [facility] 2/9 07/25/2016 01/30/2016 10/12/2015 06/02/2015  Decreased Interest 0 0 0 0  Down, Depressed, Hopeless 0 0 0 0  PHQ - 2 Score 0 0 0 0     Fall Risk: Fall Risk  07/25/2016 01/30/2016 10/12/2015 06/02/2015  Falls in the past year? No No No No      Functional Status Survey: Is the patient deaf or have difficulty hearing?: No Does the patient have difficulty seeing, even when wearing glasses/contacts?: No Does the patient have difficulty concentrating, remembering, or making decisions?: No Does the patient have difficulty walking or climbing stairs?: No Does the patient have difficulty dressing or bathing?: No Does the patient have  difficulty doing errands alone such as visiting a doctor's office or shopping?: No    Assessment & Plan  1. Encounter for routine history and physical exam for male  Discussed importance of 150 minutes of physical activity weekly, eat two servings of fish weekly, eat one serving of tree nuts ( cashews, pistachios, pecans, almonds.Marland Kitchen) every other day, eat 6 servings of fruit/vegetables daily and drink plenty of water and avoid sweet beverages.   2. ADD (attention deficit disorder)  - amphetamine-dextroamphetamine (ADDERALL XR) 25 MG 24 hr capsule; Take 1 capsule by mouth every morning.  Dispense: 30 capsule; Refill: 0 - amphetamine-dextroamphetamine (ADDERALL XR) 25 MG 24 hr capsule; Take 1 capsule by mouth every morning. Fill January 27th, 2017  Dispense: 30 capsule; Refill: 0 - amphetamine-dextroamphetamine (ADDERALL XR) 25 MG 24 hr capsule; Take 1 capsule by mouth every morning.  Dispense: 30 capsule; Refill: 0 - amphetamine-dextroamphetamine (ADDERALL) 10 MG tablet; Take 1 tablet (10 mg total) by mouth daily with lunch.  Fill  08/23/2016  Dispense: 30 tablet; Refill: 0 - amphetamine-dextroamphetamine (ADDERALL) 10 MG tablet; Take 1 tablet (10 mg total) by mouth daily with lunch.  Dispense: 30 tablet; Refill: 0 - amphetamine-dextroamphetamine (ADDERALL) 10 MG tablet; Take 1 tablet (10 mg total) by mouth 2 (two) times daily.  Dispense: 30 tablet; Refill: 0  3. Panic attack as reaction to stress  - ALPRAZolam (XANAX) 0.5 MG tablet; Take 1 tablet (0.5 mg total) by mouth daily as needed for anxiety.  Dispense: 30 tablet; Refill: 0

## 2016-07-26 LAB — HEMOGLOBIN A1C
HEMOGLOBIN A1C: 5.3 % (ref <5.7)
MEAN PLASMA GLUCOSE: 105 mg/dL

## 2016-11-01 ENCOUNTER — Ambulatory Visit (INDEPENDENT_AMBULATORY_CARE_PROVIDER_SITE_OTHER): Payer: BLUE CROSS/BLUE SHIELD | Admitting: Family Medicine

## 2016-11-01 ENCOUNTER — Encounter: Payer: Self-pay | Admitting: Family Medicine

## 2016-11-01 VITALS — BP 134/86 | HR 94 | Temp 98.0°F | Resp 16 | Ht 74.0 in | Wt 283.5 lb

## 2016-11-01 DIAGNOSIS — M545 Low back pain: Secondary | ICD-10-CM | POA: Diagnosis not present

## 2016-11-01 DIAGNOSIS — F900 Attention-deficit hyperactivity disorder, predominantly inattentive type: Secondary | ICD-10-CM | POA: Diagnosis not present

## 2016-11-01 DIAGNOSIS — I8390 Asymptomatic varicose veins of unspecified lower extremity: Secondary | ICD-10-CM | POA: Diagnosis not present

## 2016-11-01 DIAGNOSIS — F410 Panic disorder [episodic paroxysmal anxiety] without agoraphobia: Secondary | ICD-10-CM | POA: Diagnosis not present

## 2016-11-01 DIAGNOSIS — F430 Acute stress reaction: Secondary | ICD-10-CM | POA: Diagnosis not present

## 2016-11-01 DIAGNOSIS — G8929 Other chronic pain: Secondary | ICD-10-CM | POA: Diagnosis not present

## 2016-11-01 MED ORDER — AMPHETAMINE-DEXTROAMPHETAMINE 10 MG PO TABS: 10.0000 mg | ORAL_TABLET | Freq: Every day | ORAL | 0 refills | Status: DC

## 2016-11-01 MED ORDER — CYCLOBENZAPRINE HCL 10 MG PO TABS: 10.0000 mg | ORAL_TABLET | ORAL | 2 refills | Status: DC | PRN

## 2016-11-01 MED ORDER — AMPHETAMINE-DEXTROAMPHET ER 30 MG PO CP24: 30.0000 mg | ORAL_CAPSULE | ORAL | 0 refills | Status: DC

## 2016-11-01 MED ORDER — ALPRAZOLAM 0.5 MG PO TABS: 0.5000 mg | ORAL_TABLET | Freq: Every day | ORAL | 0 refills | Status: DC | PRN

## 2016-11-01 MED ORDER — AMPHETAMINE-DEXTROAMPHETAMINE 10 MG PO TABS: 10.0000 mg | ORAL_TABLET | Freq: Two times a day (BID) | ORAL | 0 refills | Status: DC

## 2016-11-01 NOTE — Progress Notes (Signed)
Name: Christian Hamilton   MRN: [id number]    DOB: Mar 19, 1986   Date:11/01/2016       Progress Note  Subjective  Chief Complaint  Chief Complaint  Patient presents with  . ADD    3 month follow up  . Medication Refill    HPI  ADD: he is taking Adderall XR in am's and Adderall 10 mg at lunch, no longer able to focus well by 11 am, and feeling more tired, taking caffeine to stay awake. He has not been sleeping more than 6 hours per night, and advised to go to bed earlier.  . No tics  Panic attacks: seldom now, taking alprazolam a couple times a week, denies side effects. He is due for refills of medication. Symptoms described as feeling anxious, chest tight and mild SOB, resolves with medication  Varicose veins: no recent problems, no pain, occasionally swelling of left leg when he stands for over 12 hours  Chronic intermittent back pain: usually dull ache in the lower middle back, no radiculitis, usually after lifting heavy objects at work, taking flexeril prn with good results.    Patient Active Problem List   Diagnosis Date Noted  . Varicocele 07/25/2016  . Neck pain 10/12/2015  . Panic attack as reaction to stress 06/02/2015  . ADD (attention deficit disorder) 05/30/2015  . Back pain, chronic 05/30/2015  . Chronic venous insufficiency 05/30/2015  . Abnormal antinuclear antibody titer 05/30/2015  . Leg varices 05/30/2015  . Migraine without aura and responsive to treatment 05/10/2009  . 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 Obesity (BMI 30-39.9) 08/08/2008    Past Surgical History:  Procedure Laterality Date  . LEG SURGERY    . SHOULDER SURGERY    . TONSILECTOMY, ADENOIDECTOMY, BILATERAL MYRINGOTOMY AND TUBES    . tubes in ears      Family History  Problem Relation Age of Onset  . Kidney disease Father   . ADD / ADHD Brother     Social History   Social History  . Marital status: Married    Spouse name: N/A  . Number of children: N/A  . Years of  education: N/A   Occupational History  . Not on file.   Social History Main Topics  . Smoking status: Never Smoker  . Smokeless tobacco: Never Used  . Alcohol use 3.6 oz/week    6 Cans of beer per week  . Drug use: No  . Sexual activity: Yes   Other Topics Concern  . Not on file   Social History Narrative  . No narrative on file     Current Outpatient Prescriptions:  .  ALPRAZolam (XANAX) 0.5 MG tablet, Take 1 tablet (0.5 mg total) by mouth daily as needed for anxiety., Disp: 30 tablet, Rfl: 0 .  amphetamine-dextroamphetamine (ADDERALL XR) 30 MG 24 hr capsule, Take 1 capsule (30 mg total) by mouth every morning., Disp: 30 capsule, Rfl: 0 .  amphetamine-dextroamphetamine (ADDERALL XR) 30 MG 24 hr capsule, Take 1 capsule (30 mg total) by mouth every morning. Fill January 27th, 2017, Disp: 30 capsule, Rfl: 0 .  amphetamine-dextroamphetamine (ADDERALL XR) 30 MG 24 hr capsule, Take 1 capsule (30 mg total) by mouth every morning., Disp: 30 capsule, Rfl: 0 .  amphetamine-dextroamphetamine (ADDERALL) 10 MG tablet, Take 1 tablet (10 mg total) by mouth daily with lunch., Disp: 30 tablet, Rfl: 0 .  amphetamine-dextroamphetamine (ADDERALL) 10 MG tablet, Take 1 tablet (10 mg total) by mouth daily with lunch., Disp:  30 tablet, Rfl: 0 .  amphetamine-dextroamphetamine (ADDERALL) 10 MG tablet, Take 1 tablet (10 mg total) by mouth 2 (two) times daily., Disp: 30 tablet, Rfl: 0 .  cyclobenzaprine (FLEXERIL) 10 MG tablet, Take 1 tablet (10 mg total) by mouth as needed., Disp: 30 tablet, Rfl: 2  No Known Allergies   ROS  Constitutional: Negative for fever or weight change.  Respiratory: Negative for cough and shortness of breath.   Cardiovascular: Negative for chest pain or palpitations.  Gastrointestinal: Negative for abdominal pain, no bowel changes.  Musculoskeletal: Negative for gait problem or joint swelling.  Skin: Negative for rash.  Neurological: Negative for dizziness or headache.  No  other specific complaints in a complete review of systems (except as listed in HPI above).  Objective  Vitals:   11/01/16 0931  BP: 134/86  Pulse: 94  Resp: 16  Temp: 98 F (36.7 C)  SpO2: 98%  Weight: 283 lb 8 oz (128.6 kg)  Height: 6\' 2"  (1.88 m)    Body mass index is 36.4 kg/m.  Physical Exam  Constitutional: Patient appears well-developed and well-nourished. Obese  No distress.  HEENT: head atraumatic, normocephalic, pupils equal and reactive to light,  neck supple, throat within normal limits Cardiovascular: Normal rate, regular rhythm and normal heart sounds.  No murmur heard. No BLE edema. Varicose veins on left leg Pulmonary/Chest: Effort normal and breath sounds normal. No respiratory distress. Abdominal: Soft.  There is no tenderness. Psychiatric: Patient has a normal mood and affect. behavior is normal. Judgment and thought content normal.  PHQ2/9: Depression screen [facility] 2/9 11/01/2016 07/25/2016 01/30/2016 10/12/2015 06/02/2015  Decreased Interest 0 0 0 0 0  Down, Depressed, Hopeless 0 0 0 0 0  PHQ - 2 Score 0 0 0 0 0     Fall Risk: Fall Risk  11/01/2016 07/25/2016 01/30/2016 10/12/2015 06/02/2015  Falls in the past year? No No No No No     Functional Status Survey: Is the patient deaf or have difficulty hearing?: No Does the patient have difficulty seeing, even when wearing glasses/contacts?: No Does the patient have difficulty concentrating, remembering, or making decisions?: No Does the patient have difficulty walking or climbing stairs?: No Does the patient have difficulty dressing or bathing?: No Does the patient have difficulty doing errands alone such as visiting a doctor's office or shopping?: No    Assessment & Plan  1. Attention deficit hyperactivity disorder (ADHD), predominantly inattentive type  He feels like medication only lasts until 11 am and even with second dose does not feel like is keeping him as focused so he has been drinking more coffee  -  amphetamine-dextroamphetamine (ADDERALL XR) 30 MG 24 hr capsule; Take 1 capsule (30 mg total) by mouth every morning.  Dispense: 30 capsule; Refill: 0 - amphetamine-dextroamphetamine (ADDERALL XR) 30 MG 24 hr capsule; Take 1 capsule (30 mg total) by mouth every morning. Fill January 27th, 2017  Dispense: 30 capsule; Refill: 0 - amphetamine-dextroamphetamine (ADDERALL XR) 30 MG 24 hr capsule; Take 1 capsule (30 mg total) by mouth every morning.  Dispense: 30 capsule; Refill: 0 - amphetamine-dextroamphetamine (ADDERALL) 10 MG tablet; Take 1 tablet (10 mg total) by mouth daily with lunch.  Dispense: 30 tablet; Refill: 0 - amphetamine-dextroamphetamine (ADDERALL) 10 MG tablet; Take 1 tablet (10 mg total) by mouth daily with lunch.  Dispense: 30 tablet; Refill: 0 - amphetamine-dextroamphetamine (ADDERALL) 10 MG tablet; Take 1 tablet (10 mg total) by mouth 2 (two) times daily.  Dispense: 30 tablet; Refill:  0  2. Chronic bilateral low back pain without sciatica  Intermittent  - cyclobenzaprine (FLEXERIL) 10 MG tablet; Take 1 tablet (10 mg total) by mouth as needed.  Dispense: 30 tablet; Refill: 2  3. Leg varices  Stable, no pain at this time  4. Panic attack as reaction to stress  - ALPRAZolam (XANAX) 0.5 MG tablet; Take 1 tablet (0.5 mg total) by mouth daily as needed for anxiety.  Dispense: 30 tablet; Refill: 0

## 2016-11-01 NOTE — Addendum Note (Signed)
Addended by: Lolita Rieger D on: 11/01/2016 10:17 AM   Modules accepted: Orders

## 2017-01-23 ENCOUNTER — Encounter: Payer: Self-pay | Admitting: Family Medicine

## 2017-01-23 ENCOUNTER — Ambulatory Visit (INDEPENDENT_AMBULATORY_CARE_PROVIDER_SITE_OTHER): Payer: BLUE CROSS/BLUE SHIELD | Admitting: Family Medicine

## 2017-01-23 VITALS — BP 128/86 | HR 83 | Temp 97.7°F | Resp 16 | Ht 74.0 in | Wt 292.7 lb

## 2017-01-23 DIAGNOSIS — I872 Venous insufficiency (chronic) (peripheral): Secondary | ICD-10-CM

## 2017-01-23 DIAGNOSIS — G8929 Other chronic pain: Secondary | ICD-10-CM

## 2017-01-23 DIAGNOSIS — M545 Low back pain: Secondary | ICD-10-CM

## 2017-01-23 DIAGNOSIS — F900 Attention-deficit hyperactivity disorder, predominantly inattentive type: Secondary | ICD-10-CM | POA: Diagnosis not present

## 2017-01-23 DIAGNOSIS — E669 Obesity, unspecified: Secondary | ICD-10-CM

## 2017-01-23 MED ORDER — AMPHETAMINE-DEXTROAMPHET ER 30 MG PO CP24: 30.0000 mg | ORAL_CAPSULE | ORAL | 0 refills | Status: DC

## 2017-01-23 MED ORDER — AMPHETAMINE-DEXTROAMPHETAMINE 10 MG PO TABS: 10.0000 mg | ORAL_TABLET | Freq: Two times a day (BID) | ORAL | 0 refills | Status: DC

## 2017-01-23 MED ORDER — AMPHETAMINE-DEXTROAMPHETAMINE 10 MG PO TABS: 10.0000 mg | ORAL_TABLET | Freq: Every day | ORAL | 0 refills | Status: DC

## 2017-01-23 NOTE — Progress Notes (Signed)
Name: Christian Hamilton   MRN: [id number]    DOB: [birth year]   Date:01/23/2017       Progress Note  Subjective  Chief Complaint  Chief Complaint  Patient presents with  . Medication Refill    3 month F/U  . ADHD    Medication works well, no problems  . Spasms    Takes medication as needed and helps symptoms    HPI  ADD: he is taking Adderall XR in am's and Adderall 10 mg at lunch, he states that medication is lasting until noon to one pm with recurrent dose. No tics, bp is at goal. Appetite is not suppressed.  Panic attacks: seldom now, taking alprazolam a couple times a week, denies side effects. He is due for refills of medication. Symptoms described as feeling anxious, chest tight and mild SOB, resolves with medication. He has noticed some flushing sensation on his face, intermittently, not taking any supplementation at this time. Not associated with nausea or chest pain.   Varicose veins: no recent problems, no pain, occasionally swelling of left leg when he stands for over 12 hours  Chronic intermittent back pain: usually dull ache in the lower middle back, no radiculitis, usually after lifting heavy objects at work, taking flexeril prn with good results. Not currently having pain  Obesity: he states he is trying to lose again, he states he was drinking 4 beers every night since last visit, but stopped completely one week ago. He has been sleeping better at night and thinks weight gain was secondary to added calories.    Patient Active Problem List   Diagnosis Date Noted  . Varicocele 07/25/2016  . Neck pain 10/12/2015  . Panic attack as reaction to stress 06/02/2015  . ADD (attention deficit disorder) 05/30/2015  . Back pain, chronic 05/30/2015  . Chronic venous insufficiency 05/30/2015  . Abnormal antinuclear antibody titer 05/30/2015  . Leg varices 05/30/2015  . Migraine without aura and responsive to treatment 05/10/2009  . Transient arterial occlusion of retina  05/10/2009  . Obesity (BMI 30-39.9) 08/08/2008    Past Surgical History:  Procedure Laterality Date  . LEG SURGERY    . SHOULDER SURGERY    . TONSILECTOMY, ADENOIDECTOMY, BILATERAL MYRINGOTOMY AND TUBES    . tubes in ears      Family History  Problem Relation Age of Onset  . Kidney disease Father   . ADD / ADHD Brother     Social History   Social History  . Marital status: Married    Spouse name: [name]  . Number of children: 3  . Years of education: N/A   Occupational History  . Manager     [employer]   Social History Main Topics  . Smoking status: Never Smoker  . Smokeless tobacco: Never Used  . Alcohol use 3.6 oz/week    6 Cans of beer per week  . Drug use: No  . Sexual activity: Yes   Other Topics Concern  . Not on file   Social History Narrative   He is married and has 3 children     Current Outpatient Prescriptions:  .  ALPRAZolam (XANAX) 0.5 MG tablet, Take 1 tablet (0.5 mg total) by mouth daily as needed for anxiety., Disp: 30 tablet, Rfl: 0 .  amphetamine-dextroamphetamine (ADDERALL XR) 30 MG 24 hr capsule, Take 1 capsule (30 mg total) by mouth every morning., Disp: 30 capsule, Rfl: 0 .  amphetamine-dextroamphetamine (ADDERALL XR) 30 MG 24 hr capsule, Take  1 capsule (30 mg total) by mouth every morning. Fill January 27th, 2017, Disp: 30 capsule, Rfl: 0 .  amphetamine-dextroamphetamine (ADDERALL XR) 30 MG 24 hr capsule, Take 1 capsule (30 mg total) by mouth every morning., Disp: 30 capsule, Rfl: 0 .  amphetamine-dextroamphetamine (ADDERALL) 10 MG tablet, Take 1 tablet (10 mg total) by mouth daily with lunch. Fill April 1st, 2018, Disp: 30 tablet, Rfl: 0 .  amphetamine-dextroamphetamine (ADDERALL) 10 MG tablet, Take 1 tablet (10 mg total) by mouth daily with lunch., Disp: 30 tablet, Rfl: 0 .  amphetamine-dextroamphetamine (ADDERALL) 10 MG tablet, Take 1 tablet (10 mg total) by mouth 2 (two) times daily., Disp: 30 tablet, Rfl: 0 .  cyclobenzaprine  (FLEXERIL) 10 MG tablet, Take 1 tablet (10 mg total) by mouth as needed., Disp: 30 tablet, Rfl: 2  No Known Allergies   ROS  Constitutional: Negative for fever, positive for  weight change.  Respiratory: Negative for cough and shortness of breath.   Cardiovascular: Negative for chest pain or palpitations.  Gastrointestinal: Negative for abdominal pain, no bowel changes.  Musculoskeletal: Negative for gait problem or joint swelling.  Skin: Negative for rash.  Neurological: Negative for dizziness or headache.  No other specific complaints in a complete review of systems (except as listed in HPI above).  Objective  Vitals:   01/23/17 0949  BP: 128/86  Pulse: 83  Resp: 16  Temp: 97.7 F (36.5 C)  TempSrc: Oral  SpO2: 96%  Weight: 292 lb 11.2 oz (132.8 kg)  Height: 6\' 2"  (1.88 m)    Body mass index is 37.58 kg/m.  Physical Exam  Constitutional: Patient appears well-developed and well-nourished. Obese  No distress.  HEENT: head atraumatic, normocephalic, pupils equal and reactive to light,  neck supple, throat within normal limits Cardiovascular: Normal rate, regular rhythm and normal heart sounds.  No murmur heard. No BLE edema. Pulmonary/Chest: Effort normal and breath sounds normal. No respiratory distress. Abdominal: Soft.  There is no tenderness. Psychiatric: Patient has a normal mood and affect. behavior is normal. Judgment and thought content normal.  PHQ2/9: Depression screen [name] 2/9 01/23/2017 11/01/2016 07/25/2016 01/30/2016 10/12/2015  Decreased Interest 0 0 0 0 0  Down, Depressed, Hopeless 0 0 0 0 0  PHQ - 2 Score 0 0 0 0 0     Fall Risk: Fall Risk  01/23/2017 11/01/2016 07/25/2016 01/30/2016 10/12/2015  Falls in the past year? No No No No No     Functional Status Survey: Is the patient deaf or have difficulty hearing?: No Does the patient have difficulty seeing, even when wearing glasses/contacts?: No Does the patient have difficulty concentrating, remembering,  or making decisions?: No Does the patient have difficulty walking or climbing stairs?: No Does the patient have difficulty dressing or bathing?: No Does the patient have difficulty doing errands alone such as visiting a doctor's office or shopping?: No   Assessment & Plan  1. Attention deficit hyperactivity disorder (ADHD), predominantly inattentive type  - amphetamine-dextroamphetamine (ADDERALL XR) 30 MG 24 hr capsule; Take 1 capsule (30 mg total) by mouth every morning.  Dispense: 30 capsule; Refill: 0 - amphetamine-dextroamphetamine (ADDERALL XR) 30 MG 24 hr capsule; Take 1 capsule (30 mg total) by mouth every morning. Fill January 27th, 2017  Dispense: 30 capsule; Refill: 0 - amphetamine-dextroamphetamine (ADDERALL XR) 30 MG 24 hr capsule; Take 1 capsule (30 mg total) by mouth every morning.  Dispense: 30 capsule; Refill: 0 - amphetamine-dextroamphetamine (ADDERALL) 10 MG tablet; Take 1 tablet (10 mg total)  by mouth daily with lunch. Fill April 1st, 2018  Dispense: 30 tablet; Refill: 0 - amphetamine-dextroamphetamine (ADDERALL) 10 MG tablet; Take 1 tablet (10 mg total) by mouth daily with lunch.  Dispense: 30 tablet; Refill: 0 - amphetamine-dextroamphetamine (ADDERALL) 10 MG tablet; Take 1 tablet (10 mg total) by mouth 2 (two) times daily.  Dispense: 30 tablet; Refill: 0  2. Chronic bilateral low back pain without sciatica  Intermittent symptoms   3. Obesity (BMI 30-39.9)  Discussed with the patient the risk posed by an increased BMI. Discussed importance of portion control, calorie counting and at least 150 minutes of physical activity weekly. Avoid sweet beverages and drink more water. Eat at least 6 servings of fruit and vegetables daily   4. Chronic venous insufficiency  Stable, seen by Vascular surgeon

## 2017-03-20 ENCOUNTER — Encounter: Payer: Self-pay | Admitting: Family Medicine

## 2017-03-20 ENCOUNTER — Emergency Department: Payer: BLUE CROSS/BLUE SHIELD

## 2017-03-20 ENCOUNTER — Encounter: Payer: Self-pay | Admitting: Emergency Medicine

## 2017-03-20 ENCOUNTER — Ambulatory Visit (INDEPENDENT_AMBULATORY_CARE_PROVIDER_SITE_OTHER): Payer: BLUE CROSS/BLUE SHIELD | Admitting: Family Medicine

## 2017-03-20 ENCOUNTER — Emergency Department
Admission: EM | Admit: 2017-03-20 | Discharge: 2017-03-20 | Disposition: A | Discharge status: Home / Self Care | Payer: BLUE CROSS/BLUE SHIELD | Attending: Emergency Medicine | Admitting: Emergency Medicine

## 2017-03-20 VITALS — BP 130/86 | HR 98 | Temp 98.1°F | Resp 18 | Ht 74.0 in | Wt 278.0 lb

## 2017-03-20 DIAGNOSIS — R0602 Shortness of breath: Secondary | ICD-10-CM | POA: Diagnosis not present

## 2017-03-20 DIAGNOSIS — F430 Acute stress reaction: Secondary | ICD-10-CM

## 2017-03-20 DIAGNOSIS — R079 Chest pain, unspecified: Secondary | ICD-10-CM | POA: Diagnosis not present

## 2017-03-20 DIAGNOSIS — F410 Panic disorder [episodic paroxysmal anxiety] without agoraphobia: Secondary | ICD-10-CM | POA: Diagnosis not present

## 2017-03-20 DIAGNOSIS — F909 Attention-deficit hyperactivity disorder, unspecified type: Secondary | ICD-10-CM | POA: Diagnosis not present

## 2017-03-20 DIAGNOSIS — I8390 Asymptomatic varicose veins of unspecified lower extremity: Secondary | ICD-10-CM

## 2017-03-20 DIAGNOSIS — J45909 Unspecified asthma, uncomplicated: Secondary | ICD-10-CM | POA: Insufficient documentation

## 2017-03-20 DIAGNOSIS — M79662 Pain in left lower leg: Secondary | ICD-10-CM

## 2017-03-20 DIAGNOSIS — I872 Venous insufficiency (chronic) (peripheral): Secondary | ICD-10-CM | POA: Diagnosis not present

## 2017-03-20 DIAGNOSIS — M79605 Pain in left leg: Secondary | ICD-10-CM | POA: Insufficient documentation

## 2017-03-20 DIAGNOSIS — R252 Cramp and spasm: Secondary | ICD-10-CM

## 2017-03-20 DIAGNOSIS — M7989 Other specified soft tissue disorders: Secondary | ICD-10-CM | POA: Diagnosis not present

## 2017-03-20 DIAGNOSIS — Z79899 Other long term (current) drug therapy: Secondary | ICD-10-CM | POA: Insufficient documentation

## 2017-03-20 LAB — CBC
HCT: 43.7 % (ref 40.0–52.0)
Hemoglobin: 15.1 g/dL (ref 13.0–18.0)
MCH: 32.2 pg (ref 26.0–34.0)
MCHC: 34.5 g/dL (ref 32.0–36.0)
MCV: 93.1 fL (ref 80.0–100.0)
Platelets: 167 10*3/uL (ref 150–440)
RBC: 4.69 MIL/uL (ref 4.40–5.90)
RDW: 12.7 % (ref 11.5–14.5)
WBC: 10 10*3/uL (ref 3.8–10.6)

## 2017-03-20 LAB — BASIC METABOLIC PANEL
Anion gap: 7 (ref 5–15)
BUN: 11 mg/dL (ref 6–20)
CHLORIDE: 101 mmol/L (ref 101–111)
CO2: 29 mmol/L (ref 22–32)
Calcium: 9.4 mg/dL (ref 8.9–10.3)
Creatinine, Ser: 0.58 mg/dL — ABNORMAL LOW (ref 0.61–1.24)
GFR calc Af Amer: >60 mL/min (ref >60)
GFR calc non Af Amer: >60 mL/min (ref >60)
GLUCOSE: 99 mg/dL (ref 65–99)
Potassium: 3.9 mmol/L (ref 3.5–5.1)
Sodium: 137 mmol/L (ref 135–145)

## 2017-03-20 LAB — TROPONIN I: Troponin I: <0.03 ng/mL (ref <0.03)

## 2017-03-20 LAB — FIBRIN DERIVATIVES D-DIMER (ARMC ONLY): Fibrin derivatives D-dimer (ARMC): 212.83 (ref 0.00–499.00)

## 2017-03-20 MED ORDER — ALPRAZOLAM 0.5 MG PO TABS: 0.5000 mg | ORAL_TABLET | Freq: Every day | ORAL | 0 refills | Status: DC | PRN

## 2017-03-20 NOTE — ED Triage Notes (Signed)
First nurse note-pt sent from PCP.  Provider called. Sending pt for r/o DVT/PE.  Pt has had left calf pain per her report. Had some CP/SHOB yesterday but denies any today.  Ambulatory to check in desk without difficulty. respirations unlabored.

## 2017-03-20 NOTE — ED Provider Notes (Signed)
[facility] Emergency Department Provider Note   ____________________________________________   I have reviewed the triage vital signs and the nursing notes.   HISTORY  Chief Complaint Other (calf pain)   History limited by: Not Limited   HPI Christian Hamilton is a 31 y.o. male who presents to the emergency department today from primary care doctor's office because of concerns for potential left lower leg DVT and PE. The patient states that he went to his doctor because of some left leg cramping and chest pain. Left leg cramping is been going on for 3 days. It is been somewhat intermittent. It was most severe yesterday although has gotten better today. His located around his left calf. It does not recall any particular trauma or injury to his leg. In addition patient did have some epigastric and lower chest pain yesterday. Describes it as sharp. It was accompanied by some cough and shortness of breath. This has resolved.   Past Medical History:  Diagnosis Date  . ADHD (attention deficit hyperactivity disorder)   . Anxiety   . Asthma   . Back pain     Patient Active Problem List   Diagnosis Date Noted  . Varicocele 07/25/2016  . Neck pain 10/12/2015  . Panic attack as reaction to stress 06/02/2015  . ADD (attention deficit disorder) 05/30/2015  . Back pain, chronic 05/30/2015  . Chronic venous insufficiency 05/30/2015  . Abnormal antinuclear antibody titer 05/30/2015  . Leg varices 05/30/2015  . Migraine without aura and responsive to treatment 05/10/2009  . 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 Obesity (BMI 30-39.9) 08/08/2008    Past Surgical History:  Procedure Laterality Date  . LEG SURGERY    . SHOULDER SURGERY    . TONSILECTOMY, ADENOIDECTOMY, BILATERAL MYRINGOTOMY AND TUBES    . tubes in ears      Prior to Admission medications   Medication Sig Start Date End Date Taking? Authorizing Provider  ALPRAZolam Duanne Moron) 0.5 MG  tablet Take 1 tablet (0.5 mg total) by mouth daily as needed for anxiety. 03/20/17   Hubbard Hartshorn, FNP  amphetamine-dextroamphetamine (ADDERALL XR) 30 MG 24 hr capsule Take 1 capsule (30 mg total) by mouth every morning. 01/23/17   Steele Sizer, MD  amphetamine-dextroamphetamine (ADDERALL XR) 30 MG 24 hr capsule Take 1 capsule (30 mg total) by mouth every morning. Fill January 27th, 2017 01/23/17   Steele Sizer, MD  amphetamine-dextroamphetamine (ADDERALL XR) 30 MG 24 hr capsule Take 1 capsule (30 mg total) by mouth every morning. 01/23/17   Steele Sizer, MD  amphetamine-dextroamphetamine (ADDERALL) 10 MG tablet Take 1 tablet (10 mg total) by mouth daily with lunch. Fill April 1st, 2018 01/23/17   Steele Sizer, MD  amphetamine-dextroamphetamine (ADDERALL) 10 MG tablet Take 1 tablet (10 mg total) by mouth daily with lunch. 01/23/17   Steele Sizer, MD  amphetamine-dextroamphetamine (ADDERALL) 10 MG tablet Take 1 tablet (10 mg total) by mouth 2 (two) times daily. 01/23/17   Steele Sizer, MD  cyclobenzaprine (FLEXERIL) 10 MG tablet Take 1 tablet (10 mg total) by mouth as needed. 11/01/16   Steele Sizer, MD    Allergies Patient has no known allergies.  Family History  Problem Relation Age of Onset  . Kidney disease Father   . ADD / ADHD Brother     Social History Social History  Substance Use Topics  . Smoking status: Never Smoker  . Smokeless tobacco: Never Used  . Alcohol use 3.6 oz/week  6 Cans of beer per week    Review of Systems Constitutional: No fever/chills Eyes: No visual changes. ENT: No sore throat. Cardiovascular: Positive chest pain. Respiratory: Positive shortness of breath. Gastrointestinal: No abdominal pain.  No nausea, no vomiting.  No diarrhea.   Genitourinary: Negative for dysuria. Musculoskeletal: Positive for left calf pain. Skin: Positive for some bruising over left calf Neurological: Negative for headaches, focal weakness or  numbness.  ____________________________________________   PHYSICAL EXAM:  VITAL SIGNS: ED Triage Vitals  Enc Vitals Group     BP 03/20/17 1515 136/89     Pulse Rate 03/20/17 1515 80     Resp 03/20/17 1515 18     Temp 03/20/17 1515 98.5 F (36.9 C)     Temp Source 03/20/17 1515 Oral     SpO2 03/20/17 1515 98 %     Weight 03/20/17 1515 278 lb (126.1 kg)     Height 03/20/17 1515 6\' 2"  (1.88 m)     Head Circumference --      Peak Flow --      Pain Score 03/20/17 1520 0    Constitutional: Alert and oriented. Well appearing and in no distress. Eyes: Conjunctivae are normal.  ENT   Head: Normocephalic and atraumatic.   Nose: No congestion/rhinnorhea.   Mouth/Throat: Mucous membranes are moist.   Neck: No stridor. Hematological/Lymphatic/Immunilogical: No cervical lymphadenopathy. Cardiovascular: Normal rate, regular rhythm.  No murmurs, rubs, or gallops. * Respiratory: Normal respiratory effort without tachypnea nor retractions. Breath sounds are clear and equal bilaterally. No wheezes/rales/rhonchi. Gastrointestinal: Soft and non tender. No rebound. No guarding.  Genitourinary: Deferred Musculoskeletal: Normal range of motion in all extremities. No lower extremity edema. Neurologic:  Normal speech and language. No gross focal neurologic deficits are appreciated.  Skin:  Two small areas of ecchymosis over the left calf Psychiatric: Mood and affect are normal. Speech and behavior are normal. Patient exhibits appropriate insight and judgment.  ____________________________________________    LABS (pertinent positives/negatives)  Labs Reviewed  BASIC METABOLIC PANEL - Abnormal; Notable for the following:       Result Value   Creatinine, Ser 0.58 (*)    All other components within normal limits  CBC  TROPONIN I  FIBRIN DERIVATIVES D-DIMER ([facility] ONLY)     ____________________________________________   EKG  I, Nance Pear, attending physician,  personally viewed and interpreted this EKG  EKG Time: 1526 Rate: 81 Rhythm: normal sinus rhythm Axis: normal Intervals: qtc 436 QRS: narrow ST changes: no st elevation Impression: normal ekg   ____________________________________________    RADIOLOGY  [place] left lower ext IMPRESSION: No evidence of DVT within the LEFT lower extremity.  Venous varicosities identified at LEFT calf.  CXR IMPRESSION: No active cardiopulmonary disease.  ____________________________________________   PROCEDURES  Procedures  ____________________________________________   INITIAL IMPRESSION / ASSESSMENT AND PLAN / ED COURSE  Pertinent labs & imaging results that were available during my care of the patient were reviewed by me and considered in my medical decision making (see chart for details).  Patient sent from primary care doctor's office because of concerns for blood clots. D-dimer is negative. Left lower extremity ultrasound negative. At this point given the patient's prescription and the settings with the chest pain and wondered if it was more gastritis or acid reflux. EKG without any concerning findings. The patient will be discharged follow-up with primary care.  ____________________________________________   FINAL CLINICAL IMPRESSION(S) / ED DIAGNOSES  Final diagnoses:  Leg cramp  Nonspecific chest pain  Note: This dictation was prepared with Dragon dictation. Any transcriptional errors that result from this process are unintentional     Nance Pear, MD 03/20/17 1650

## 2017-03-20 NOTE — Discharge Instructions (Signed)
Please seek medical attention for any high fevers, chest pain, shortness of breath, change in behavior, persistent vomiting, bloody stool or any other new or concerning symptoms.  

## 2017-03-20 NOTE — Patient Instructions (Signed)
Please go directly to [facility] ER for care of your chest pain, shortness of breath, and left calf pain. Schedule a follow-up in accordance with ED discharge instructions.

## 2017-03-20 NOTE — ED Notes (Signed)
Pt states he had a bad cramp in left leg and became short of breath yesterday and his PCP sent him to the er for eval of DVT or PE today - pt states he feels better today

## 2017-03-20 NOTE — Progress Notes (Addendum)
Name: Christian Hamilton   MRN: [id number]    DOB: April 04, 1986   Date:03/20/2017       Progress Note  Subjective  Chief Complaint  Chief Complaint  Patient presents with  . Follow-up    left leg pain, cramping    HPI  Pt presents with 3 day history of left calf pain that is intermittent and feels like cramping.  Has history of varicose vein removal in the Left calf and has ongoing swelling intermittently. He does not that he noticed some heat to the left posterior calf intermittent x1 day.  Has not traveled or been sitting for a long period of time recently, no ASA therapy, no anticoagulation therapy.  He also notes shortness of breath that lasted 12 hours yesterday. It did eventually go away and he has felt okay today. Also had one episode of sharp substernal chest pain that lasted 2 hours yesterday and then went away.  He states the shortness of breath and chest pain felt very different from panic attacks and anxiety in which he has a history of.   He also notes that his varicose veins had been slowly worsening in the left calf, he requests referral back to vein and vascular for re-evaluation.   Anxiety and Panic Attacks: Takes Xanax .5mg  0-3 times a week depending on stress levels. He requests a refill today as he has been out for about 2 months and has been doing okay, but would like to have it on hand when he does need it. He is agreeable to make 30 pills last 3-5 months, and will schedule follow up with PCP accordingly. [facility] reporting system checked and is consistent with patient's chart and report.  Patient Active Problem List   Diagnosis Date Noted  . Varicocele 07/25/2016  . Neck pain 10/12/2015  . Panic attack as reaction to stress 06/02/2015  . ADD (attention deficit disorder) 05/30/2015  . Back pain, chronic 05/30/2015  . Chronic venous insufficiency 05/30/2015  . Abnormal antinuclear antibody titer 05/30/2015  . Leg varices 05/30/2015  . Migraine without aura and responsive to  treatment 05/10/2009  . 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 Obesity (BMI 30-39.9) 08/08/2008    Social History  Substance Use Topics  . Smoking status: Never Smoker  . Smokeless tobacco: Never Used  . Alcohol use 3.6 oz/week    6 Cans of beer per week    Current Outpatient Prescriptions:  .  amphetamine-dextroamphetamine (ADDERALL XR) 30 MG 24 hr capsule, Take 1 capsule (30 mg total) by mouth every morning., Disp: 30 capsule, Rfl: 0 .  amphetamine-dextroamphetamine (ADDERALL XR) 30 MG 24 hr capsule, Take 1 capsule (30 mg total) by mouth every morning. Fill January 27th, 2017, Disp: 30 capsule, Rfl: 0 .  amphetamine-dextroamphetamine (ADDERALL XR) 30 MG 24 hr capsule, Take 1 capsule (30 mg total) by mouth every morning., Disp: 30 capsule, Rfl: 0 .  amphetamine-dextroamphetamine (ADDERALL) 10 MG tablet, Take 1 tablet (10 mg total) by mouth daily with lunch. Fill April 1st, 2018, Disp: 30 tablet, Rfl: 0 .  amphetamine-dextroamphetamine (ADDERALL) 10 MG tablet, Take 1 tablet (10 mg total) by mouth daily with lunch., Disp: 30 tablet, Rfl: 0 .  amphetamine-dextroamphetamine (ADDERALL) 10 MG tablet, Take 1 tablet (10 mg total) by mouth 2 (two) times daily., Disp: 30 tablet, Rfl: 0 .  cyclobenzaprine (FLEXERIL) 10 MG tablet, Take 1 tablet (10 mg total) by mouth as needed., Disp: 30 tablet, Rfl: 2 .  ALPRAZolam (XANAX) 0.5 MG  tablet, Take 1 tablet (0.5 mg total) by mouth daily as needed for anxiety. (Patient not taking: Reported on 03/20/2017), Disp: 30 tablet, Rfl: 0  No Known Allergies  ROS  Constitutional: Negative for fever or weight change.  Respiratory: Negative for cough and positive for shortness of breath.   Cardiovascular: Positive for chest pain and negative palpitations.  Gastrointestinal: Negative for abdominal pain, no bowel changes.  Musculoskeletal: Negative for gait problem or joint swelling.  Skin: Negative for rash.  Neurological: Negative for dizziness or  headache.  No other specific complaints in a complete review of systems (except as listed in HPI above).  Objective  Vitals:   03/20/17 1405  BP: 130/86  Pulse: 98  Resp: 18  Temp: 98.1 F (36.7 C)  TempSrc: Oral  SpO2: 99%  Weight: 278 lb (126.1 kg)  Height: 6\' 2"  (1.88 m)    Body mass index is 35.69 kg/m.  Nursing Note and Vital Signs reviewed.  Physical Exam  Constitutional: Patient appears well-developed and well-nourished. Obese No distress.  HEENT: head atraumatic, normocephalic Cardiovascular: Normal rate, regular rhythm, S1/S2 present.  No murmur or rub heard. No BLE edema. Pulmonary/Chest: Effort normal and breath sounds clear. No respiratory distress or retractions. Abdominal: Soft and non-tender, bowel sounds present x4 quadrants. MSK: Tenderness of left calf on palpation, varicose veins present. Bilateral calves measure 48cm in circumference. Skin: No erythema or rash. Psychiatric: Patient has a normal mood and affect. behavior is normal. Judgment and thought content normal.  No results found for this or any previous visit (from the past 2160 hour(s)).   Assessment & Plan  1. Pain of left calf --Explained unable to rule out PE or DVT in office today.  Advised that due to shortness of breath combined with calf pain, patient should go to the ER for further evaluation. He is agreeable and declines EMS transport. Vitals stable. Report called to ER Nurse [place] [facility].  2. Leg varices - Ambulatory referral to Vascular Surgery  3. Chronic venous insufficiency  - Ambulatory referral to Vascular Surgery  4. Shortness of breath -Advised that due to shortness of breath combined with calf pain, patient should go to the ER for further evaluation. He is agreeable and declines EMS transport. Vitals stable. Report called to ER Nurse [place] [facility].  5. Panic attack as reaction to stress  - ALPRAZolam (XANAX) 0.5 MG tablet; Take 1 tablet (0.5 mg total) by mouth daily  as needed for anxiety.  Dispense: 30 tablet; Refill: 0  -Red flags and when to present for emergency care or RTC including fever >101.84F, chest pain, shortness of breath, new/worsening/un-resolving symptoms, reviewed with patient at time of visit. Follow up and care instructions discussed and provided in AVS.  I have reviewed this encounter including the documentation in this note and/or discussed this patient with the Johney Maine, FNP, NP-C. I am certifying that I agree with the content of this note as supervising physician.  Steele Sizer, MD [facility] 04/06/2017, 8:25 PM

## 2017-03-20 NOTE — ED Triage Notes (Signed)
Pt sent from PCP to r/o DVT/PE.  Pt c/o left calf pain. Had swelling yesterday, none today.  Did c/o SHOB/CP for half day yesterday but denies any today.  NAD. VSS. Respirations unlabored. ambulatory to triage.

## 2017-03-25 ENCOUNTER — Telehealth: Payer: Self-pay | Admitting: Family Medicine

## 2017-03-25 NOTE — Telephone Encounter (Signed)
Could you please give the patient a call to see how he is doing? I sent him to the ER last week for calf pain and shortness of breath/chest pain, and his work-up was negative.  I had also referred him to vein and vascular - please make sure this appointment is set up.  If he needs to see [place] before that appointment please schedule him. Thank you!

## 2017-03-26 NOTE — Telephone Encounter (Signed)
Call left message for patient to call

## 2017-04-25 ENCOUNTER — Ambulatory Visit: Payer: BLUE CROSS/BLUE SHIELD | Admitting: Family Medicine

## 2017-05-09 ENCOUNTER — Ambulatory Visit (INDEPENDENT_AMBULATORY_CARE_PROVIDER_SITE_OTHER): Payer: BLUE CROSS/BLUE SHIELD | Admitting: Family Medicine

## 2017-05-09 ENCOUNTER — Encounter: Payer: Self-pay | Admitting: Family Medicine

## 2017-05-09 VITALS — BP 132/84 | HR 93 | Temp 97.9°F | Resp 18 | Ht 74.0 in | Wt 284.4 lb

## 2017-05-09 DIAGNOSIS — M25562 Pain in left knee: Secondary | ICD-10-CM | POA: Diagnosis not present

## 2017-05-09 MED ORDER — DICLOFENAC SODIUM 75 MG PO TBEC: 75.0000 mg | DELAYED_RELEASE_TABLET | Freq: Two times a day (BID) | ORAL | 0 refills | Status: DC

## 2017-05-09 NOTE — Progress Notes (Signed)
Name: Christian Hamilton   MRN: [id number]    DOB: Jan 07, 1986   Date:05/09/2017       Progress Note  Subjective  Chief Complaint  Chief Complaint  Patient presents with  . Knee Pain    injury 2 days ago and has gotten worse some swelling    HPI  Left acute knee pain: he was at the beach and thinks he injured his left knee while playing in the water 2 days ago, he got out of the water and left knee felt unstable and painful, went to bed and the following day he had difficulty bearing weight on left leg. He has noticed that left left pops and gives out and also has an effusion, no redness or increase in warmth. He is taking otc NSAID's. He went to work today, but could not stay secondary to pain and difficulty bearing weight, feeling worse. No previous injury or surgery on the left knee  Patient Active Problem List   Diagnosis Date Noted  . Varicocele 07/25/2016  . Neck pain 10/12/2015  . Panic attack as reaction to stress 06/02/2015  . ADD (attention deficit disorder) 05/30/2015  . Back pain, chronic 05/30/2015  . Chronic venous insufficiency 05/30/2015  . Abnormal antinuclear antibody titer 05/30/2015  . Leg varices 05/30/2015  . Migraine without aura and responsive to treatment 05/10/2009  . 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 Obesity (BMI 30-39.9) 08/08/2008    Past Surgical History:  Procedure Laterality Date  . LEG SURGERY    . SHOULDER SURGERY    . TONSILECTOMY, ADENOIDECTOMY, BILATERAL MYRINGOTOMY AND TUBES    . tubes in ears      Family History  Problem Relation Age of Onset  . Kidney disease Father   . ADD / ADHD Brother     Social History   Social History  . Marital status: Married    Spouse name: [name]  . Number of children: 3  . Years of education: N/A   Occupational History  . Manager     [employer]   Social History Main Topics  . Smoking status: Never Smoker  . Smokeless tobacco: Never Used  . Alcohol use 3.6 oz/week    6  Cans of beer per week  . Drug use: No  . Sexual activity: Yes   Other Topics Concern  . Not on file   Social History Narrative   He is married and has 3 children     Current Outpatient Prescriptions:  .  ALPRAZolam (XANAX) 0.5 MG tablet, Take 1 tablet (0.5 mg total) by mouth daily as needed for anxiety., Disp: 30 tablet, Rfl: 0 .  amphetamine-dextroamphetamine (ADDERALL XR) 30 MG 24 hr capsule, Take 1 capsule (30 mg total) by mouth every morning., Disp: 30 capsule, Rfl: 0 .  amphetamine-dextroamphetamine (ADDERALL XR) 30 MG 24 hr capsule, Take 1 capsule (30 mg total) by mouth every morning. Fill January 27th, 2017, Disp: 30 capsule, Rfl: 0 .  amphetamine-dextroamphetamine (ADDERALL XR) 30 MG 24 hr capsule, Take 1 capsule (30 mg total) by mouth every morning., Disp: 30 capsule, Rfl: 0 .  amphetamine-dextroamphetamine (ADDERALL) 10 MG tablet, Take 1 tablet (10 mg total) by mouth daily with lunch. Fill April 1st, 2018, Disp: 30 tablet, Rfl: 0 .  amphetamine-dextroamphetamine (ADDERALL) 10 MG tablet, Take 1 tablet (10 mg total) by mouth daily with lunch., Disp: 30 tablet, Rfl: 0 .  amphetamine-dextroamphetamine (ADDERALL) 10 MG tablet, Take 1 tablet (10 mg total) by mouth  2 (two) times daily., Disp: 30 tablet, Rfl: 0 .  cyclobenzaprine (FLEXERIL) 10 MG tablet, Take 1 tablet (10 mg total) by mouth as needed., Disp: 30 tablet, Rfl: 2 .  diclofenac (VOLTAREN) 75 MG EC tablet, Take 1 tablet (75 mg total) by mouth 2 (two) times daily., Disp: 30 tablet, Rfl: 0  No Known Allergies   ROS  Ten systems reviewed and is negative except as mentioned in HPI   Objective  Vitals:   05/09/17 1149  BP: 132/84  Pulse: 93  Resp: 18  Temp: 97.9 F (36.6 C)  SpO2: 98%  Weight: 284 lb 6 oz (129 kg)  Height: _0  (1.88 m)    Body mass index is 36.51 kg/m.  Physical Exam  Constitutional: Patient appears well-developed and well-nourished. Obese  No distress.  HEENT: head atraumatic,  normocephalic, pupils equal and reactive to light, neck supple, throat within normal limits Cardiovascular: Normal rate, regular rhythm and normal heart sounds.  No murmur heard. No BLE edema. Pulmonary/Chest: Effort normal and breath sounds normal. No respiratory distress. Abdominal: Soft.  There is no tenderness. Psychiatric: Patient has a normal mood and affect. behavior is normal. Judgment and thought content normal. Muscular Skeletal: left knee effusion, no redness or increase in warmth, decrease in rom/causes pain, [place] seems negative  Recent Results (from the past 2160 hour(s))  Basic metabolic panel     Status: Abnormal   Collection Time: 03/20/17  3:24 PM  Result Value Ref Range   Sodium 137 135 - 145 mmol/L   Potassium 3.9 3.5 - 5.1 mmol/L   Chloride 101 101 - 111 mmol/L   CO2 29 22 - 32 mmol/L   Glucose, Bld 99 65 - 99 mg/dL   BUN 11 6 - 20 mg/dL   Creatinine, Ser 0.58 (L) 0.61 - 1.24 mg/dL   Calcium 9.4 8.9 - 10.3 mg/dL   GFR calc non Af Amer >60 >60 mL/min   GFR calc Af Amer >60 >60 mL/min    Comment: (NOTE) The eGFR has been calculated using the CKD EPI equation. This calculation has not been validated in all clinical situations. eGFR's persistently <60 mL/min signify possible Chronic Kidney Disease.    Anion gap 7 5 - 15  CBC     Status: None   Collection Time: 03/20/17  3:24 PM  Result Value Ref Range   WBC 10.0 3.8 - 10.6 K/uL   RBC 4.69 4.40 - 5.90 MIL/uL   Hemoglobin 15.1 13.0 - 18.0 g/dL   HCT 43.7 40.0 - 52.0 %   MCV 93.1 80.0 - 100.0 fL   MCH 32.2 26.0 - 34.0 pg   MCHC 34.5 32.0 - 36.0 g/dL   RDW 12.7 11.5 - 14.5 %   Platelets 167 150 - 440 K/uL  Troponin I     Status: None   Collection Time: 03/20/17  3:24 PM  Result Value Ref Range   Troponin I <0.03 <0.03 ng/mL  Fibrin derivatives D-Dimer ([facility] only)     Status: None   Collection Time: 03/20/17  3:24 PM  Result Value Ref Range   Fibrin derivatives D-dimer ([facility]) 212.83 0.00 - 499.00     Comment: (NOTE) <> Exclusion of Venous Thromboembolism (VTE) - OUTPATIENT ONLY   (Emergency Department or [place])   0-499 ng/ml (FEU): With a low to intermediate pretest probability                      for VTE this test result  excludes the diagnosis                      of VTE.   >499 ng/ml (FEU) : VTE not excluded; additional work up for VTE is                      required. <> Testing on Inpatients and Evaluation of Disseminated Intravascular   Coagulation (DIC) Reference Range:   0-499 ng/ml (FEU)       PHQ2/9: Depression screen [facility] 2/9 01/23/2017 11/01/2016 07/25/2016 01/30/2016 10/12/2015  Decreased Interest 0 0 0 0 0  Down, Depressed, Hopeless 0 0 0 0 0  PHQ - 2 Score 0 0 0 0 0    Fall Risk: Fall Risk  01/23/2017 11/01/2016 07/25/2016 01/30/2016 10/12/2015  Falls in the past year? _0      Assessment & Plan  1. Acute pain of left knee  May need MRI - diclofenac (VOLTAREN) 75 MG EC tablet; Take 1 tablet (75 mg total) by mouth 2 (two) times daily.  Dispense: 30 tablet; Refill: 0 - Ambulatory referral to Orthopedic Surgery

## 2017-05-12 DIAGNOSIS — S86912A Strain of unspecified muscle(s) and tendon(s) at lower leg level, left leg, initial encounter: Secondary | ICD-10-CM | POA: Diagnosis not present

## 2017-05-20 DIAGNOSIS — S86912S Strain of unspecified muscle(s) and tendon(s) at lower leg level, left leg, sequela: Secondary | ICD-10-CM | POA: Diagnosis not present

## 2017-05-20 DIAGNOSIS — M25562 Pain in left knee: Secondary | ICD-10-CM | POA: Diagnosis not present

## 2017-05-23 DIAGNOSIS — S86912S Strain of unspecified muscle(s) and tendon(s) at lower leg level, left leg, sequela: Secondary | ICD-10-CM | POA: Diagnosis not present

## 2017-05-23 DIAGNOSIS — M25562 Pain in left knee: Secondary | ICD-10-CM | POA: Diagnosis not present

## 2017-05-27 DIAGNOSIS — M25562 Pain in left knee: Secondary | ICD-10-CM | POA: Diagnosis not present

## 2017-05-27 DIAGNOSIS — S86912S Strain of unspecified muscle(s) and tendon(s) at lower leg level, left leg, sequela: Secondary | ICD-10-CM | POA: Diagnosis not present

## 2017-05-29 DIAGNOSIS — S86912S Strain of unspecified muscle(s) and tendon(s) at lower leg level, left leg, sequela: Secondary | ICD-10-CM | POA: Diagnosis not present

## 2017-05-29 DIAGNOSIS — M25562 Pain in left knee: Secondary | ICD-10-CM | POA: Diagnosis not present

## 2017-06-02 DIAGNOSIS — S86912S Strain of unspecified muscle(s) and tendon(s) at lower leg level, left leg, sequela: Secondary | ICD-10-CM | POA: Diagnosis not present

## 2017-06-02 DIAGNOSIS — M25562 Pain in left knee: Secondary | ICD-10-CM | POA: Diagnosis not present

## 2017-06-03 ENCOUNTER — Ambulatory Visit: Payer: BLUE CROSS/BLUE SHIELD | Admitting: Family Medicine

## 2017-06-05 ENCOUNTER — Encounter: Payer: Self-pay | Admitting: Family Medicine

## 2017-06-05 ENCOUNTER — Ambulatory Visit (INDEPENDENT_AMBULATORY_CARE_PROVIDER_SITE_OTHER): Payer: BLUE CROSS/BLUE SHIELD | Admitting: Family Medicine

## 2017-06-05 VITALS — BP 132/86 | HR 101 | Temp 98.3°F | Resp 16 | Ht 74.0 in | Wt 282.0 lb

## 2017-06-05 DIAGNOSIS — E669 Obesity, unspecified: Secondary | ICD-10-CM | POA: Diagnosis not present

## 2017-06-05 DIAGNOSIS — F900 Attention-deficit hyperactivity disorder, predominantly inattentive type: Secondary | ICD-10-CM | POA: Diagnosis not present

## 2017-06-05 DIAGNOSIS — M25562 Pain in left knee: Secondary | ICD-10-CM | POA: Diagnosis not present

## 2017-06-05 DIAGNOSIS — G4726 Circadian rhythm sleep disorder, shift work type: Secondary | ICD-10-CM

## 2017-06-05 MED ORDER — ARMODAFINIL 150 MG PO TABS: 150.0000 mg | ORAL_TABLET | Freq: Every day | ORAL | 2 refills | Status: DC

## 2017-06-05 NOTE — Progress Notes (Addendum)
Name: Christian Hamilton   MRN: [id number]    DOB: 1986-08-12   Date:06/05/2017       Progress Note  Subjective  Chief Complaint  Chief Complaint  Patient presents with  . Medication Refill  . ADHD    Medication has been making him super aggregated with medication and wanted to discuss other options. Has been trouble focusing and remembering.  . Back Pain    Stable takes Flexeril as needed   . Panic Attacks    HPI  ADD: he was taking Adderall XR in am's and Adderall 10 mg at lunch, however noticed increase in irritability and bp elevation. He stopped medication about one month ago and states has been using a lot of caffeine to be able to focus and stay awake, but less irritable. He works weird shift, at times from 4am to [employer] and states one of his friends did well on [place] and he would like to try it.  Panic attacks: seldom now, taking alprazolam a couple times a week, denies side effects. He is due for refills of medication. Symptoms described as feeling anxious, chest tight and mild SOB, resolves with medication. He has noticed some flushing sensation on his face, intermittently, not taking any supplementation at this time. Not associated with nausea or chest pain. He still has medication at home, last dose a couple of weeks ago   Varicose veins: no recent problems, no pain, occasionally swelling of left leg when he stands for over 12 hours -  He usually works long shifts  Chronic intermittent back pain: usually dull ache in the lower middle back, no radiculitis, usually after lifting heavy objects at work, taking flexeril prn with good results. Not currently having pain  Obesity: his weight is stable, he is cutting down on drinking beer to once or twice a week and at most 2-3 beers. He has not been as active since knee injury about one month ago. He is skipping meals and eating a lot at night.   Acute left knee injury: while at the beach, seen at [facility] and is having PT, swelling is  down, and pain has also improved, taking diclofenac only prn now. Pain is worse at the end of work day, 5/10  Patient Active Problem List   Diagnosis Date Noted  . Varicocele 07/25/2016  . Neck pain 10/12/2015  . Panic attack as reaction to stress 06/02/2015  . ADD (attention deficit disorder) 05/30/2015  . Back pain, chronic 05/30/2015  . Chronic venous insufficiency 05/30/2015  . Abnormal antinuclear antibody titer 05/30/2015  . Leg varices 05/30/2015  . Migraine without aura and responsive to treatment 05/10/2009  . 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 Obesity (BMI 30-39.9) 08/08/2008    Past Surgical History:  Procedure Laterality Date  . LEG SURGERY    . SHOULDER SURGERY    . TONSILECTOMY, ADENOIDECTOMY, BILATERAL MYRINGOTOMY AND TUBES    . tubes in ears      Family History  Problem Relation Age of Onset  . Kidney disease Father   . ADD / ADHD Brother     Social History   Social History  . Marital status: Married    Spouse name: [name]  . Number of children: 3  . Years of education: N/A   Occupational History  . Manager     [employer]   Social History Main Topics  . Smoking status: Never Smoker  . Smokeless tobacco: Never Used  . Alcohol use  3.6 oz/week    6 Cans of beer per week  . Drug use: No  . Sexual activity: Yes   Other Topics Concern  . Not on file   Social History Narrative   He is married and has 3 children     Current Outpatient Prescriptions:  .  ALPRAZolam (XANAX) 0.5 MG tablet, Take 1 tablet (0.5 mg total) by mouth daily as needed for anxiety., Disp: 30 tablet, Rfl: 0 .  cyclobenzaprine (FLEXERIL) 10 MG tablet, Take 1 tablet (10 mg total) by mouth as needed., Disp: 30 tablet, Rfl: 2 .  diclofenac (VOLTAREN) 75 MG EC tablet, Take 1 tablet (75 mg total) by mouth 2 (two) times daily., Disp: 30 tablet, Rfl: 0 .  Armodafinil 150 MG tablet, Take 1 tablet (150 mg total) by mouth daily., Disp: 30 tablet, Rfl: 2  No  Known Allergies   ROS  Constitutional: Negative for fever or weight change.  Respiratory: Negative for cough and shortness of breath.   Cardiovascular: Negative for chest pain or palpitations.  Gastrointestinal: Negative for abdominal pain, no bowel changes.  Musculoskeletal: Negative for gait problem or joint swelling.  Skin: Negative for rash.  Neurological: Negative for dizziness or headache.  No other specific complaints in a complete review of systems (except as listed in HPI above).  Objective  Vitals:   06/05/17 1029  BP: 132/86  Pulse: (!) 101  Resp: 16  Temp: 98.3 F (36.8 C)  TempSrc: Oral  SpO2: 96%  Weight: 282 lb (127.9 kg)  Height: '6\' 2"'$  (1.88 m)    Body mass index is 36.21 kg/m.  Physical Exam  Constitutional: Patient appears well-developed and well-nourished. Obese  No distress.  HEENT: head atraumatic, normocephalic, pupils equal and reactive to light,  neck supple, throat within normal limits Cardiovascular: Normal rate, regular rhythm and normal heart sounds.  No murmur heard. No BLE edema. Pulmonary/Chest: Effort normal and breath sounds normal. No respiratory distress. Abdominal: Soft.  There is no tenderness. Psychiatric: Patient has a normal mood and affect. behavior is normal. Judgment and thought content normal. Muscular skeletal: no crepitus or effusion of left knee    Recent Results (from the past 2160 hour(s))  Basic metabolic panel     Status: Abnormal   Collection Time: 03/20/17  3:24 PM  Result Value Ref Range   Sodium 137 135 - 145 mmol/L   Potassium 3.9 3.5 - 5.1 mmol/L   Chloride 101 101 - 111 mmol/L   CO2 29 22 - 32 mmol/L   Glucose, Bld 99 65 - 99 mg/dL   BUN 11 6 - 20 mg/dL   Creatinine, Ser 0.58 (L) 0.61 - 1.24 mg/dL   Calcium 9.4 8.9 - 10.3 mg/dL   GFR calc non Af Amer >60 >60 mL/min   GFR calc Af Amer >60 >60 mL/min    Comment: (NOTE) The eGFR has been calculated using the CKD EPI equation. This calculation has not  been validated in all clinical situations. eGFR's persistently <60 mL/min signify possible Chronic Kidney Disease.    Anion gap 7 5 - 15  CBC     Status: None   Collection Time: 03/20/17  3:24 PM  Result Value Ref Range   WBC 10.0 3.8 - 10.6 K/uL   RBC 4.69 4.40 - 5.90 MIL/uL   Hemoglobin 15.1 13.0 - 18.0 g/dL   HCT 43.7 40.0 - 52.0 %   MCV 93.1 80.0 - 100.0 fL   MCH 32.2 26.0 - 34.0 pg  MCHC 34.5 32.0 - 36.0 g/dL   RDW 12.7 11.5 - 14.5 %   Platelets 167 150 - 440 K/uL  Troponin I     Status: None   Collection Time: 03/20/17  3:24 PM  Result Value Ref Range   Troponin I <0.03 <0.03 ng/mL  Fibrin derivatives D-Dimer ([facility] only)     Status: None   Collection Time: 03/20/17  3:24 PM  Result Value Ref Range   Fibrin derivatives D-dimer ([facility]) 212.83 0.00 - 499.00    Comment: (NOTE) <> Exclusion of Venous Thromboembolism (VTE) - OUTPATIENT ONLY   (Emergency Department or [place])   0-499 ng/ml (FEU): With a low to intermediate pretest probability                      for VTE this test result excludes the diagnosis                      of VTE.   >499 ng/ml (FEU) : VTE not excluded; additional work up for VTE is                      required. <> Testing on Inpatients and Evaluation of Disseminated Intravascular   Coagulation (DIC) Reference Range:   0-499 ng/ml (FEU)       PHQ2/9: Depression screen [facility] 2/9 06/05/2017 01/23/2017 11/01/2016 07/25/2016 01/30/2016  Decreased Interest 0 0 0 0 0  Down, Depressed, Hopeless 0 0 0 0 0  PHQ - 2 Score 0 0 0 0 0     Fall Risk: Fall Risk  06/05/2017 01/23/2017 11/01/2016 07/25/2016 01/30/2016  Falls in the past year? No No No No No     Functional Status Survey: Is the patient deaf or have difficulty hearing?: No Does the patient have difficulty seeing, even when wearing glasses/contacts?: No Does the patient have difficulty concentrating, remembering, or making decisions?: No Does the patient have difficulty walking or climbing stairs?:  No Does the patient have difficulty dressing or bathing?: No Does the patient have difficulty doing errands alone such as visiting a doctor's office or shopping?: No   Assessment & Plan  1. Acute pain of left knee  Continue follow up with PT and Ortho, doing better  2. Attention deficit hyperactivity disorder (ADHD), predominantly inattentive type  He wants to stay off stimulant for now, explained we can try Vyvanse if he would like  3. Obesity (BMI 30-39.9)  Discussed with the patient the risk posed by an increased BMI. Discussed importance of portion control, calorie counting and at least 150 minutes of physical activity weekly. Avoid sweet beverages and drink more water. Eat at least 6 servings of fruit and vegetables daily   4. Shift work sleep disorder  We will try Nuvigil, he is feeling sleepy while driving home from work , had side effects while taking Adderal  - Armodafinil 150 MG tablet; Take 1 tablet (150 mg total) by mouth daily.  Dispense: 30 tablet; Refill: 2

## 2017-06-06 DIAGNOSIS — S86912S Strain of unspecified muscle(s) and tendon(s) at lower leg level, left leg, sequela: Secondary | ICD-10-CM | POA: Diagnosis not present

## 2017-06-06 DIAGNOSIS — M25562 Pain in left knee: Secondary | ICD-10-CM | POA: Diagnosis not present

## 2017-06-09 DIAGNOSIS — M25562 Pain in left knee: Secondary | ICD-10-CM | POA: Diagnosis not present

## 2017-06-09 DIAGNOSIS — S86912S Strain of unspecified muscle(s) and tendon(s) at lower leg level, left leg, sequela: Secondary | ICD-10-CM | POA: Diagnosis not present

## 2017-06-13 DIAGNOSIS — S86912S Strain of unspecified muscle(s) and tendon(s) at lower leg level, left leg, sequela: Secondary | ICD-10-CM | POA: Diagnosis not present

## 2017-06-13 DIAGNOSIS — M25562 Pain in left knee: Secondary | ICD-10-CM | POA: Diagnosis not present

## 2017-06-16 DIAGNOSIS — S86912A Strain of unspecified muscle(s) and tendon(s) at lower leg level, left leg, initial encounter: Secondary | ICD-10-CM | POA: Diagnosis not present

## 2017-07-09 ENCOUNTER — Ambulatory Visit (INDEPENDENT_AMBULATORY_CARE_PROVIDER_SITE_OTHER): Payer: BLUE CROSS/BLUE SHIELD | Admitting: Family Medicine

## 2017-07-09 ENCOUNTER — Encounter: Payer: Self-pay | Admitting: Family Medicine

## 2017-07-09 VITALS — BP 118/84 | HR 80 | Temp 98.1°F | Resp 18 | Ht 74.0 in | Wt 282.7 lb

## 2017-07-09 DIAGNOSIS — Z0000 Encounter for general adult medical examination without abnormal findings: Secondary | ICD-10-CM

## 2017-07-09 DIAGNOSIS — E669 Obesity, unspecified: Secondary | ICD-10-CM | POA: Diagnosis not present

## 2017-07-09 NOTE — Progress Notes (Addendum)
Name: Christian Hamilton   MRN: [id number]    DOB: [birth year]   Date:07/09/2017       Progress Note  Subjective  Chief Complaint  Chief Complaint  Patient presents with  . Annual Exam    HPI  Patient presents for Annual CPE and has no other concerns today.  Married to wife Christian Hamilton) has 3 boys (1.33yo, 33yo, and 33yo).  Works as Scientist, research (medical) at [employer]  Diet: Rarely eats out, no fast food; bagel for breakfast, sometimes misses lunch; he and his wife cook at home most of the time; uses spices but not salt. Doesn't fry foods.  Exercise: Mostly runs around with his kids; no set regimen right now because he hurt his knee recently (saw ortho and was released).  IPSS Questionnaire (AUA-7): Over the past month.   1)  How often have you had a sensation of not emptying your bladder completely after you finish urinating?  0 - Not at all  2)  How often have you had to urinate again less than two hours after you finished urinating? 0 - Not at all  3)  How often have you found you stopped and started again several times when you urinated?  0 - Not at all  4) How difficult have you found it to postpone urination?  0 - Not at all  5) How often have you had a weak urinary stream?  0 - Not at all  6) How often have you had to push or strain to begin urination?  0 - Not at all  7) How many times did you most typically get up to urinate from the time you went to bed until the time you got up in the morning?  0 - None  Total score:  0-7 mildly symptomatic   8-19 moderately symptomatic   20-35 severely symptomatic   Depression:  Depression screen [facility] 2/9 07/09/2017 06/05/2017 01/23/2017 11/01/2016 07/25/2016  Decreased Interest 0 0 0 0 0  Down, Depressed, Hopeless 0 0 0 0 0  PHQ - 2 Score 0 0 0 0 0  Altered sleeping 0 - - - -  Tired, decreased energy 0 - - - -  Change in appetite 0 - - - -  Feeling bad or failure about yourself  0 - - - -  Trouble  concentrating 0 - - - -  Moving slowly or fidgety/restless 0 - - - -  Suicidal thoughts 0 - - - -  PHQ-9 Score 0 - - - -  Difficult doing work/chores Not difficult at all - - - -   Hypertension: BP Readings from Last 3 Encounters:  07/09/17 118/84  06/05/17 132/86  05/09/17 132/84   Obesity: Wt Readings from Last 3 Encounters:  07/09/17 282 lb 11.2 oz (128.2 kg)  06/05/17 282 lb (127.9 kg)  05/09/17 284 lb 6 oz (129 kg)   BMI Readings from Last 3 Encounters:  07/09/17 36.30 kg/m  06/05/17 36.21 kg/m  05/09/17 36.51 kg/m    Alcohol: Drinks about twice a week 2-3 drinks at a time Tobacco use: Never used HIV, hep B, hep C: Had HIV testing in the past and was negative.  Married STD testing and prevention (chl/gon/syphilis): Declines Lipids:  Lab Results  Component Value Date   CHOL 166 07/25/2016   Lab Results  Component Value Date   HDL 53 07/25/2016   Lab Results  Component Value Date   LDLCALC  103 07/25/2016   Lab Results  Component Value Date   TRIG 52 07/25/2016   Lab Results  Component Value Date   CHOLHDL 3.1 07/25/2016   No results found for: LDLDIRECT Glucose:  Glucose, Bld  Date Value Ref Range Status  03/20/2017 99 65 - 99 mg/dL Final  07/25/2016 91 65 - 99 mg/dL Final   Colorectal cancer: No family or personal history; no recent changes in stools or BM's - no blood in stool or dark and tarry stools, no abdominal pain. Prostate cancer: No family or personal history; AUA score is 0. No results found for: PSA Lung cancer: Never smoker Skin cancer: Has one mole on his back that PCP Dr. Ancil Boozer had asked him to have biopsied but he never did. ECG: Had performed 03/21/2017  Patient Active Problem List   Diagnosis Date Noted  . Varicocele 07/25/2016  . Neck pain 10/12/2015  . Panic attack as reaction to stress 06/02/2015  . ADD (attention deficit disorder) 05/30/2015  . Back pain, chronic 05/30/2015  . Chronic venous insufficiency 05/30/2015  .  Abnormal antinuclear antibody titer 05/30/2015  . Leg varices 05/30/2015  . Migraine without aura and responsive to treatment 05/10/2009  . 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 Obesity (BMI 30-39.9) 08/08/2008    Past Surgical History:  Procedure Laterality Date  . LEG SURGERY    . SHOULDER SURGERY    . TONSILECTOMY, ADENOIDECTOMY, BILATERAL MYRINGOTOMY AND TUBES    . tubes in ears      Family History  Problem Relation Age of Onset  . Kidney disease Father   . ADD / ADHD Brother     Social History   Social History  . Marital status: Married    Spouse name: Christian Hamilton  . Number of children: 3  . Years of education: N/A   Occupational History  . Manager     [employer]   Social History Main Topics  . Smoking status: Never Smoker  . Smokeless tobacco: Never Used  . Alcohol use 3.6 oz/week    6 Cans of beer per week  . Drug use: No  . Sexual activity: Yes   Other Topics Concern  . Not on file   Social History Narrative   He is married and has 3 children    Current Outpatient Prescriptions:  .  Armodafinil 150 MG tablet, Take 1 tablet (150 mg total) by mouth daily., Disp: 30 tablet, Rfl: 2 .  diclofenac (VOLTAREN) 75 MG EC tablet, Take 1 tablet (75 mg total) by mouth 2 (two) times daily., Disp: 30 tablet, Rfl: 0 .  ALPRAZolam (XANAX) 0.5 MG tablet, Take 1 tablet (0.5 mg total) by mouth daily as needed for anxiety. (Patient not taking: Reported on 07/09/2017), Disp: 30 tablet, Rfl: 0 .  cyclobenzaprine (FLEXERIL) 10 MG tablet, Take 1 tablet (10 mg total) by mouth as needed. (Patient not taking: Reported on 07/09/2017), Disp: 30 tablet, Rfl: 2  No Known Allergies   ROS Constitutional: Negative for fever or weight change.  Respiratory: Negative for cough and shortness of breath.   Cardiovascular: Negative for chest pain or palpitations. Has BLE varicose veins. Gastrointestinal: Negative for abdominal pain, no bowel changes.  Musculoskeletal:  Negative for gait problem; mild LEFT knee swelling secondary to recent knee injury Skin: Negative for rash.  Neurological: Negative for dizziness or headache.  No other specific complaints in a complete review of systems (except as listed in HPI above).  Objective  Vitals:  07/09/17 1007  BP: 118/84  Pulse: 80  Resp: 18  Temp: 98.1 F (36.7 C)  TempSrc: Oral  SpO2: 97%  Weight: 282 lb 11.2 oz (128.2 kg)  Height: 6\' 2"  (1.88 m)    Body mass index is 36.3 kg/m.  Physical Exam Constitutional: Patient appears well-developed and well-nourished. No distress.  HENT: Head: Normocephalic and atraumatic. Ears: B TMs ok, no erythema or effusion; Nose: Nose normal. Mouth/Throat: Oropharynx is clear and moist. No oropharyngeal exudate.  Eyes: Conjunctivae and EOM are normal. Pupils are equal, round, and reactive to light. No scleral icterus.  Neck: Normal range of motion. Neck supple. No JVD present. No thyromegaly present.  Cardiovascular: Normal rate, regular rhythm and normal heart sounds.  No murmur heard. No BLE edema. Pulmonary/Chest: Effort normal and breath sounds normal. No respiratory distress. Abdominal: Soft. Bowel sounds are normal, no distension. There is no tenderness. no masses Musculoskeletal: Normal range of motion, no joint effusions. No gross deformities Neurological: he is alert and oriented to person, place, and time. No cranial nerve deficit. Coordination, balance, strength, speech and gait are normal.  Skin: Skin is warm and dry. No rash noted. No erythema.  Psychiatric: Patient has a normal mood and affect. behavior is normal. Judgment and thought content normal.  No results found for this or any previous visit (from the past 2160 hour(s)).  PHQ2/9: Depression screen [facility] 2/9 07/09/2017 06/05/2017 01/23/2017 11/01/2016 07/25/2016  Decreased Interest 0 0 0 0 0  Down, Depressed, Hopeless 0 0 0 0 0  PHQ - 2 Score 0 0 0 0 0  Altered sleeping 0 - - - -  Tired, decreased  energy 0 - - - -  Change in appetite 0 - - - -  Feeling bad or failure about yourself  0 - - - -  Trouble concentrating 0 - - - -  Moving slowly or fidgety/restless 0 - - - -  Suicidal thoughts 0 - - - -  PHQ-9 Score 0 - - - -  Difficult doing work/chores Not difficult at all - - - -   Assessment & Plan  1. Encounter for annual physical exam -Prostate cancer screening and PSA options (with potential risks and benefits of testing vs not testing) were discussed along with recent recs/guidelines. -USPSTF grade A and B recommendations reviewed with patient; age-appropriate recommendations, preventive care, screening tests, etc discussed and encouraged; healthy living encouraged; see AVS for patient education given to patient -Discussed importance of 150 minutes of physical activity weekly, eat two servings of fish weekly, eat one serving of tree nuts ( cashews, pistachios, pecans, almonds.Marland Kitchen) every other day, eat 6 servings of fruit/vegetables daily and drink plenty of water and avoid sweet beverages.    2. Obesity (BMI 30-39.9) - Lipid panel - COMPLETE METABOLIC PANEL WITH GFR - Pt not fasting today, will return at his convenience when fasting to have labs drawn.  I have reviewed this encounter including the documentation in this note and/or discussed this patient with the Johney Maine, FNP, NP-C. I am certifying that I agree with the content of this note as supervising physician.  Steele Sizer, MD [facility] 07/12/2017, 8:22 PM

## 2017-07-09 NOTE — Patient Instructions (Addendum)
Preventive Care 18-39 Years, Male Preventive care refers to lifestyle choices and visits with your health care provider that can promote health and wellness. What does preventive care include?  A yearly physical exam. This is also called an annual well check.  Dental exams once or twice a year.  Routine eye exams. Ask your health care provider how often you should have your eyes checked.  Personal lifestyle choices, including: ? Daily care of your teeth and gums. ? Regular physical activity. ? Eating a healthy diet. ? Avoiding tobacco and drug use. ? Limiting alcohol use. ? Practicing safe sex. What happens during an annual well check? The services and screenings done by your health care provider during your annual well check will depend on your age, overall health, lifestyle risk factors, and family history of disease. Counseling Your health care provider may ask you questions about your:  Alcohol use.  Tobacco use.  Drug use.  Emotional well-being.  Home and relationship well-being.  Sexual activity.  Eating habits.  Work and work environment.  Screening You may have the following tests or measurements:  Height, weight, and BMI.  Blood pressure.  Lipid and cholesterol levels. These may be checked every 5 years starting at age 20.  Diabetes screening. This is done by checking your blood sugar (glucose) after you have not eaten for a while (fasting).  Skin check.  Hepatitis C blood test.  Hepatitis B blood test.  Sexually transmitted disease (STD) testing.  Discuss your test results, treatment options, and if necessary, the need for more tests with your health care provider. Vaccines Your health care provider may recommend certain vaccines, such as:  Influenza vaccine. This is recommended every year.  Tetanus, diphtheria, and acellular pertussis (Tdap, Td) vaccine. You may need a Td booster every 10 years.  Varicella vaccine. You may need this if you  have not been vaccinated.  HPV vaccine. If you are 26 or younger, you may need three doses over 6 months.  Measles, mumps, and rubella (MMR) vaccine. You may need at least one dose of MMR.You may also need a second dose.  Pneumococcal 13-valent conjugate (PCV13) vaccine. You may need this if you have certain conditions and have not been vaccinated.  Pneumococcal polysaccharide (PPSV23) vaccine. You may need one or two doses if you smoke cigarettes or if you have certain conditions.  Meningococcal vaccine. One dose is recommended if you are age 19-21 years and a first-year college student living in a residence hall, or if you have one of several medical conditions. You may also need additional booster doses.  Hepatitis A vaccine. You may need this if you have certain conditions or if you travel or work in places where you may be exposed to hepatitis A.  Hepatitis B vaccine. You may need this if you have certain conditions or if you travel or work in places where you may be exposed to hepatitis B.  Haemophilus influenzae type b (Hib) vaccine. You may need this if you have certain risk factors.  Talk to your health care provider about which screenings and vaccines you need and how often you need them. This information is not intended to replace advice given to you by your health care provider. Make sure you discuss any questions you have with your health care provider. Document Released: 12/10/2001 Document Revised: 07/03/2016 Document Reviewed: 08/15/2015 Elsevier Interactive Patient Education  2017 Elsevier Inc.  

## 2017-07-25 ENCOUNTER — Telehealth: Payer: Self-pay | Admitting: Family Medicine

## 2017-07-25 NOTE — Telephone Encounter (Signed)
Please call pt to remind him to come in for fasting labs some time next week. Thanks!

## 2017-07-30 NOTE — Telephone Encounter (Signed)
Left message for patient to call office.  

## 2017-10-10 ENCOUNTER — Ambulatory Visit: Payer: BLUE CROSS/BLUE SHIELD | Admitting: Family Medicine

## 2017-10-10 ENCOUNTER — Encounter: Payer: Self-pay | Admitting: Family Medicine

## 2017-10-10 VITALS — BP 114/58 | HR 77 | Ht 74.0 in | Wt 288.5 lb

## 2017-10-10 DIAGNOSIS — M5412 Radiculopathy, cervical region: Secondary | ICD-10-CM

## 2017-10-10 MED ORDER — PREDNISONE 10 MG (48) PO TBPK: ORAL_TABLET | ORAL | 0 refills | Status: DC

## 2017-10-10 NOTE — Patient Instructions (Signed)
Cervical Radiculopathy  Cervical radiculopathy means that a nerve in the neck is pinched or bruised. This can cause pain or loss of feeling (numbness) that runs from your neck to your arm and fingers.  Follow these instructions at home:  Managing pain  ? Take over-the-counter and prescription medicines only as told by your doctor.  ? If directed, put ice on the injured or painful area.  ? Put ice in a plastic bag.  ? Place a towel between your skin and the bag.  ? Leave the ice on for 20 minutes, 2?3 times per day.  ? If ice does not help, you can try using heat. Take a warm shower or warm bath, or use a heat pack as told by your doctor.  ? You may try a gentle neck and shoulder massage.  Activity  ? Rest as needed. Follow instructions from your doctor about any activities to avoid.  ? Do exercises as told by your doctor or physical therapist.  General instructions  ? If you were given a soft collar, wear it as told by your doctor.  ? Use a flat pillow when you sleep.  ? Keep all follow-up visits as told by your doctor. This is important.  Contact a doctor if:  ? Your condition does not improve with treatment.  Get help right away if:  ? Your pain gets worse and is not controlled with medicine.  ? You lose feeling or feel weak in your hand, arm, face, or leg.  ? You have a fever.  ? You have a stiff neck.  ? You cannot control when you poop or pee (have incontinence).  ? You have trouble with walking, balance, or talking.  This information is not intended to replace advice given to you by your health care provider. Make sure you discuss any questions you have with your health care provider.  Document Released: 10/03/2011 Document Revised: 03/21/2016 Document Reviewed: 12/08/2014  Elsevier Interactive Patient Education ? 2018 Elsevier Inc.

## 2017-10-10 NOTE — Progress Notes (Signed)
Name: Christian Hamilton   MRN: [id number]    DOB: Mar 08, 1986   Date:10/10/2017       Progress Note  Subjective  Chief Complaint  Chief Complaint  Patient presents with  . Neck Pain  . Medication Refill    HPI  Neck pain: he states that symptoms started one week ago. He was at work and a Barista was trying to help him even though he told him not to, and had to reached to stop a generator from reaching the customer's car. He states he developed acute onset of right side neck pain, that radiated down right arm and sometimes makes his hand numb, also noticing some weakness on right arm. No rashes, nor problems with rom of right shoulder, He has a history of left shoulder surgery, but no previous injuries on the right side.   Patient Active Problem List   Diagnosis Date Noted  . Varicocele 07/25/2016  . Neck pain 10/12/2015  . Panic attack as reaction to stress 06/02/2015  . ADD (attention deficit disorder) 05/30/2015  . Back pain, chronic 05/30/2015  . Chronic venous insufficiency 05/30/2015  . Abnormal antinuclear antibody titer 05/30/2015  . Leg varices 05/30/2015  . Migraine without aura and responsive to treatment 05/10/2009  . 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 Obesity (BMI 30-39.9) 08/08/2008    Social History   Tobacco Use  . Smoking status: Never Smoker  . Smokeless tobacco: Never Used  Substance Use Topics  . Alcohol use: Yes    Alcohol/week: 3.6 oz    Types: 6 Cans of beer per week     Current Outpatient Medications:  .  cyclobenzaprine (FLEXERIL) 10 MG tablet, Take 1 tablet (10 mg total) by mouth as needed. (Patient not taking: Reported on 10/10/2017), Disp: 30 tablet, Rfl: 2  No Known Allergies  ROS  Constitutional: Negative for fever or weight change.  Respiratory: Negative for cough and shortness of breath.   Cardiovascular: Negative for chest pain or palpitations.  Gastrointestinal: Negative for abdominal pain, no bowel changes.   Musculoskeletal: Negative for gait problem or joint swelling.  Skin: Negative for rash.  Neurological: Negative for dizziness or headache.  No other specific complaints in a complete review of systems (except as listed in HPI above).  Objective  Vitals:   10/10/17 1434  BP: (!) 114/58  Pulse: 77  SpO2: 98%  Weight: 288 lb 8 oz (130.9 kg)  Height: 6\' 2"  (1.88 m)    Body mass index is 37.04 kg/m.    Physical Exam  Constitutional: Patient appears well-developed and well-nourished. Obese  No distress.  HEENT: head atraumatic, normocephalic, pupils equal and reactive to light, decrease som of right neck, tense trapezium muscle, pain with left lateral bending and rotation of neck to the left with extension, shooting pain when pressure applied to right side of neck, normal grip, throat within normal limits Cardiovascular: Normal rate, regular rhythm and normal heart sounds.  No murmur heard. No BLE edema. Pulmonary/Chest: Effort normal and breath sounds normal. No respiratory distress. Abdominal: Soft.  There is no tenderness. Psychiatric: Patient has a normal mood and affect. behavior is normal. Judgment and thought content normal. Muscular Skeletal normal rom of right shoulder   Assessment & Plan  1. Cervical radiculitis  - predniSONE (STERAPRED UNI-PAK 48 TAB) 10 MG (48) TBPK tablet; Take as directed  Dispense: 48 tablet; Refill: 0  Avoid nsaid's, take medication with food, discussed signs and symptoms of gastritis and GI bleed and  need to seek further medical attention

## 2017-11-04 DIAGNOSIS — M5030 Other cervical disc degeneration, unspecified cervical region: Secondary | ICD-10-CM | POA: Insufficient documentation

## 2017-11-26 ENCOUNTER — Ambulatory Visit: Payer: BLUE CROSS/BLUE SHIELD | Admitting: Family Medicine

## 2017-11-26 ENCOUNTER — Encounter: Payer: Self-pay | Admitting: Family Medicine

## 2017-11-26 VITALS — BP 128/86 | HR 84 | Temp 97.9°F | Resp 16 | Ht 74.0 in | Wt 296.7 lb

## 2017-11-26 DIAGNOSIS — Z13220 Encounter for screening for lipoid disorders: Secondary | ICD-10-CM | POA: Diagnosis not present

## 2017-11-26 DIAGNOSIS — I872 Venous insufficiency (chronic) (peripheral): Secondary | ICD-10-CM | POA: Diagnosis not present

## 2017-11-26 DIAGNOSIS — M5412 Radiculopathy, cervical region: Secondary | ICD-10-CM | POA: Diagnosis not present

## 2017-11-26 DIAGNOSIS — F900 Attention-deficit hyperactivity disorder, predominantly inattentive type: Secondary | ICD-10-CM | POA: Diagnosis not present

## 2017-11-26 DIAGNOSIS — E669 Obesity, unspecified: Secondary | ICD-10-CM

## 2017-11-26 DIAGNOSIS — Z131 Encounter for screening for diabetes mellitus: Secondary | ICD-10-CM | POA: Diagnosis not present

## 2017-11-26 DIAGNOSIS — R5383 Other fatigue: Secondary | ICD-10-CM

## 2017-11-26 MED ORDER — LISDEXAMFETAMINE DIMESYLATE 40 MG PO CAPS: 40.0000 mg | ORAL_CAPSULE | ORAL | 0 refills | Status: DC

## 2017-11-26 MED ORDER — LISDEXAMFETAMINE DIMESYLATE 30 MG PO CAPS: 30.0000 mg | ORAL_CAPSULE | Freq: Every day | ORAL | 0 refills | Status: DC

## 2017-11-26 MED ORDER — LISDEXAMFETAMINE DIMESYLATE 20 MG PO CAPS: 20.0000 mg | ORAL_CAPSULE | Freq: Every day | ORAL | 0 refills | Status: DC

## 2017-11-26 NOTE — Progress Notes (Signed)
Name: Christian Hamilton   MRN: [id number]    DOB: 1986-09-02   Date:11/26/2017       Progress Note  Subjective  Chief Complaint  Chief Complaint  Patient presents with  . Medication Refill  . ADD    States he can tell a big difference being off of medication-he was trying to do a spreadsheet for work and took him a hour just to get focused. Has been procrasinating more being off of medication and would like to discuss going back on something for his ADD.  Marland Kitchen Obesity    Has gained 7 pounds since last visit.    HPI  Neck pain: he was seen Fall 2018, given prednisone, symptoms did not resolve, under workman's comp now, had MRI and showed herniated disc, getting PT - through [facility]  ADD: he was taking Adderall XR in am's and Adderall 10 mg at lunch, however noticed increase in irritability and bp elevation. He stopped medication  Summer 2018 and states has been using a lot of caffeine to be able to focus and stay awake, but less irritable. He states bored at work and has noticed worsening of focus, especially when he decreased caffeine intake, discussed options and we will try Vyvanse and see if he responds better, he will call with the dose that works for him and I will send 2 more prescriptions if needed.   Panic attacks: seldom now, taking alprazolam a couple times a week, denies side effects. He is due for refills of medication. Symptoms described as feeling anxious, chest tight and mild SOB, resolves with medication. He has noticed some flushing sensation on his face, intermittently, not taking any supplementation at this time. Not associated with nausea or chest pain. He still has medication at home, no recent episodes. Not happy at work, looking for another job but not depressed.   Varicose veins: no recent problems, no pain, occasionally swelling of left leg when he stands for over 12 hours -  He usually works long shifts  Chronic intermittent back pain: usually dull ache in the lower  middle back, no radiculitis, usually after lifting heavy objects at work, taking flexeril prn.  Obesity: he has gained weight since last visit, he is avoiding beer, not as active because of neck and knee pain, but wants to resume physical activity. He was eating more bread but is being more consciousness about his diet  Acute left knee [place]: [place] the [place], seen at [facility] and he had PT, swelling is down, and pain has also improved, but still has pain intermittently, job is physical not as bad now, but wants to lose weight to see if it will help with symptoms.     Patient Active Problem List   Diagnosis Date Noted  . DDD (degenerative disc disease), cervical 11/04/2017  . Varicocele 07/25/2016  . Neck pain 10/12/2015  . Panic attack as reaction to stress 06/02/2015  . ADD (attention deficit disorder) 05/30/2015  . Back pain, chronic 05/30/2015  . Chronic venous insufficiency 05/30/2015  . Abnormal antinuclear antibody titer 05/30/2015  . Leg varices 05/30/2015  . Migraine without aura and responsive to treatment 05/10/2009  . 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 Obesity (BMI 30-39.9) 08/08/2008    Social History   Tobacco Use  . Smoking status: Never Smoker  . Smokeless tobacco: Never Used  Substance Use Topics  . Alcohol use: Yes    Alcohol/week: 3.6 oz    Types: 6 Cans  of beer per week     Current Outpatient Medications:  .  cyclobenzaprine (FLEXERIL) 10 MG tablet, Take 1 tablet (10 mg total) by mouth as needed. (Patient not taking: Reported on 10/10/2017), Disp: 30 tablet, Rfl: 2 .  lisdexamfetamine (VYVANSE) 20 MG capsule, Take 1 capsule (20 mg total) by mouth daily., Disp: 10 capsule, Rfl: 0 .  lisdexamfetamine (VYVANSE) 30 MG capsule, Take 1 capsule (30 mg total) by mouth daily., Disp: 10 capsule, Rfl: 0 .  lisdexamfetamine (VYVANSE) 40 MG capsule, Take 1 capsule (40 mg total) by mouth every morning., Disp: 10 capsule, Rfl: 0  No Known  Allergies  ROS  Constitutional: Negative for fever, positive for  weight change.  Respiratory: Negative for cough and shortness of breath.   Cardiovascular: Negative for chest pain or palpitations.  Gastrointestinal: Negative for abdominal pain, no bowel changes.  Musculoskeletal: Negative for gait problem or joint swelling.  Skin: Negative for rash.  Neurological: Negative for dizziness or headache.  No other specific complaints in a complete review of systems (except as listed in HPI above).  Objective  Vitals:   11/26/17 1030  BP: 128/86  Pulse: 84  Resp: 16  Temp: 97.9 F (36.6 C)  TempSrc: Oral  SpO2: 98%  Weight: 296 lb 11.2 oz (134.6 kg)  Height: 6\' 2"  (1.88 m)    Body mass index is 38.09 kg/m.    Physical Exam  Constitutional: Patient appears well-developed and well-nourished. Obese  No distress.  HEENT: head atraumatic, normocephalic, pupils equal and reactive to light,neck supple, throat within normal limits Cardiovascular: Normal rate, regular rhythm and normal heart sounds.  No murmur heard. No BLE edema. Pulmonary/Chest: Effort normal and breath sounds normal. No respiratory distress. Abdominal: Soft.  There is no tenderness. Psychiatric: Patient has a normal mood and affect. behavior is normal. Judgment and thought content normal.  Assessment & Plan  1. Attention deficit hyperactivity disorder (ADHD), predominantly inattentive type  - lisdexamfetamine (VYVANSE) 20 MG capsule; Take 1 capsule (20 mg total) by mouth daily.  Dispense: 10 capsule; Refill: 0 - lisdexamfetamine (VYVANSE) 30 MG capsule; Take 1 capsule (30 mg total) by mouth daily.  Dispense: 10 capsule; Refill: 0 - lisdexamfetamine (VYVANSE) 40 MG capsule; Take 1 capsule (40 mg total) by mouth every morning.  Dispense: 10 capsule; Refill: 0  2. Cervical radiculitis  Still has some pain   3. Obesity (BMI 30-39.9)  Discussed with the patient the risk posed by an increased BMI. Discussed  importance of portion control, calorie counting and at least 150 minutes of physical activity weekly. Avoid sweet beverages and drink more water. Eat at least 6 servings of fruit and vegetables daily   4. Chronic venous insufficiency   5. Lipid screening  - Lipid panel  6. Diabetes mellitus screening  - Hemoglobin A1c  7. Other fatigue  - COMPLETE METABOLIC PANEL WITH GFR - CBC with Differential/Platelet - TSH - VITAMIN D 25 Hydroxy (Vit-D Deficiency, Fractures) - Vitamin B12

## 2017-12-26 ENCOUNTER — Ambulatory Visit: Payer: BLUE CROSS/BLUE SHIELD | Admitting: Family Medicine

## 2017-12-26 ENCOUNTER — Encounter: Payer: Self-pay | Admitting: Family Medicine

## 2017-12-26 VITALS — BP 110/80 | HR 90 | Temp 97.8°F | Resp 14 | Wt 285.0 lb

## 2017-12-26 DIAGNOSIS — J029 Acute pharyngitis, unspecified: Secondary | ICD-10-CM | POA: Diagnosis not present

## 2017-12-26 DIAGNOSIS — F900 Attention-deficit hyperactivity disorder, predominantly inattentive type: Secondary | ICD-10-CM

## 2017-12-26 DIAGNOSIS — J020 Streptococcal pharyngitis: Secondary | ICD-10-CM | POA: Diagnosis not present

## 2017-12-26 LAB — POCT RAPID STREP A (OFFICE): RAPID STREP A SCREEN: POSITIVE — AB

## 2017-12-26 MED ORDER — PENICILLIN V POTASSIUM 500 MG PO TABS: 500.0000 mg | ORAL_TABLET | Freq: Two times a day (BID) | ORAL | 0 refills | Status: DC

## 2017-12-26 MED ORDER — LISDEXAMFETAMINE DIMESYLATE 50 MG PO CAPS: 50.0000 mg | ORAL_CAPSULE | ORAL | 0 refills | Status: DC

## 2017-12-26 NOTE — Progress Notes (Signed)
Name: Christian Hamilton   MRN: [id number]    DOB: 1986-10-23   Date:12/26/2017       Progress Note  Subjective  Chief Complaint  Chief Complaint  Patient presents with  . Fever  . Sore Throat    HPI  ADD: hewastaking Adderall XR in am's and Adderall 10 mg at lunch,however noticed increase in irritability and bp elevation. He stopped medication  Summer 2018 and states has been using a lot of caffeine to be able to focus and stay awake, but less irritable. He is now on Vyvanse 40 mg, he states he prefers than Adderal, does not feel irritable with Vyvanse.   Sore throat: exposed to strep ( neighbors child), and developed sore throat, high fever past 2 days. Tylenol helps a little, he has dysphagia, but no rashes.   Patient Active Problem List   Diagnosis Date Noted  . DDD (degenerative disc disease), cervical 11/04/2017  . Varicocele 07/25/2016  . Neck pain 10/12/2015  . Panic attack as reaction to stress 06/02/2015  . ADD (attention deficit disorder) 05/30/2015  . Back pain, chronic 05/30/2015  . Chronic venous insufficiency 05/30/2015  . Abnormal antinuclear antibody titer 05/30/2015  . Leg varices 05/30/2015  . Migraine without aura and responsive to treatment 05/10/2009  . 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 Obesity (BMI 30-39.9) 08/08/2008    Social History   Tobacco Use  . Smoking status: Never Smoker  . Smokeless tobacco: Never Used  Substance Use Topics  . Alcohol use: Yes    Alcohol/week: 3.6 oz    Types: 6 Cans of beer per week     Current Outpatient Medications:  .  cyclobenzaprine (FLEXERIL) 10 MG tablet, Take 1 tablet (10 mg total) by mouth as needed., Disp: 30 tablet, Rfl: 2 .  lisdexamfetamine (VYVANSE) 20 MG capsule, Take 1 capsule (20 mg total) by mouth daily., Disp: 10 capsule, Rfl: 0 .  lisdexamfetamine (VYVANSE) 30 MG capsule, Take 1 capsule (30 mg total) by mouth daily., Disp: 10 capsule, Rfl: 0 .  lisdexamfetamine (VYVANSE) 40 MG  capsule, Take 1 capsule (40 mg total) by mouth every morning., Disp: 10 capsule, Rfl: 0 .  penicillin v potassium (VEETID) 500 MG tablet, Take 1 tablet (500 mg total) by mouth 2 (two) times daily., Disp: 20 tablet, Rfl: 0  No Known Allergies  ROS  Ten systems reviewed and is negative except as mentioned in HPI   Objective  Vitals:   12/26/17 0931  BP: 110/80  Pulse: 90  Resp: 14  Temp: 97.8 F (36.6 C)  TempSrc: Oral  SpO2: 99%  Weight: 285 lb (129.3 kg)    Body mass index is 36.59 kg/m.    Physical Exam  Constitutional: Patient appears well-developed and well-nourished. Obese  No distress.  HEENT: head atraumatic, normocephalic, pupils equal and reactive to light, ears : Scars, no fluids,  neck supple, mild tender anterior lymphadenopathy, throat red and some exudate on right side, no tonsils, just pharynx is very red  Cardiovascular: Normal rate, regular rhythm and normal heart sounds.  No murmur heard. No BLE edema. Pulmonary/Chest: Effort normal and breath sounds normal. No respiratory distress. Abdominal: Soft.  There is no tenderness. Psychiatric: Patient has a normal mood and affect. behavior is normal. Judgment and thought content normal.  Recent Results (from the past 2160 hour(s))  POCT rapid strep A     Status: Abnormal   Collection Time: 12/26/17  9:42 AM  Result Value Ref Range  Rapid Strep A Screen Positive (A) Negative     Assessment & Plan  1. Sore throat  - POCT rapid strep A - penicillin v potassium (VEETID) 500 MG tablet; Take 1 tablet (500 mg total) by mouth 2 (two) times daily.  Dispense: 20 tablet; Refill: 0  2. Strep pharyngitis  - POCT rapid strep A - penicillin v potassium (VEETID) 500 MG tablet; Take 1 tablet (500 mg total) by mouth 2 (two) times daily.  Dispense: 20 tablet; Refill: 0  3. Attention deficit hyperactivity disorder (ADHD), predominantly inattentive type  - lisdexamfetamine (VYVANSE) 50 MG capsule; Take 1 capsule (50  mg total) by mouth every morning.  Dispense: 30 capsule; Refill: 0

## 2017-12-29 ENCOUNTER — Ambulatory Visit: Payer: Self-pay | Admitting: *Deleted

## 2017-12-29 NOTE — Telephone Encounter (Signed)
Contacted pt regarding if he is still contagious from strep; pt given recommendations per protocol that adults can return to work or school after taking antibiotics for 24 hours and the fever is gone; the pt is concerned that his son tested positive for strep throat and staph, and he would like to make sure he is truly not contagious because he is going to visit his grandmother in the nursing home; will forward to [facility] for notification of this encounter; the pt can be contacted at [phone]; spoke with [place]  Reason for Disposition . [1] Reasonable improvement on antibiotics AND [2] no fever  Protocols used: STREP THROAT INFECTION ON ANTIBIOTIC FOLLOW-UP CALL-A-AH

## 2018-01-19 ENCOUNTER — Ambulatory Visit: Payer: BLUE CROSS/BLUE SHIELD | Admitting: Family Medicine

## 2018-01-19 ENCOUNTER — Encounter: Payer: Self-pay | Admitting: Emergency Medicine

## 2018-01-19 ENCOUNTER — Encounter: Payer: Self-pay | Admitting: Family Medicine

## 2018-01-19 VITALS — BP 120/70 | HR 95 | Temp 98.4°F | Resp 18 | Ht 74.0 in | Wt 284.8 lb

## 2018-01-19 DIAGNOSIS — K1379 Other lesions of oral mucosa: Secondary | ICD-10-CM

## 2018-01-19 DIAGNOSIS — J029 Acute pharyngitis, unspecified: Secondary | ICD-10-CM | POA: Diagnosis not present

## 2018-01-19 LAB — POCT RAPID STREP A (OFFICE): Rapid Strep A Screen: NEGATIVE

## 2018-01-19 MED ORDER — PREDNISONE 20 MG PO TABS: 20.0000 mg | ORAL_TABLET | Freq: Two times a day (BID) | ORAL | 0 refills | Status: AC

## 2018-01-19 MED ORDER — MAGIC MOUTHWASH W/LIDOCAINE: 5.0000 mL | Freq: Four times a day (QID) | ORAL | 0 refills | Status: DC | PRN

## 2018-01-19 NOTE — Progress Notes (Signed)
Name: ZOHAIB HEENEY   MRN: [id number]    DOB: Jul 20, 1986   Date:01/19/2018       Progress Note  Subjective  Chief Complaint  Chief Complaint  Patient presents with  . Sore Throat    had strep 3 weeks ago    HPI  Pt presents with concern for sore throat that began yesterday.  He was recently treated for strep throat on 12/26/2017 with Pen V K for 10 days - this significantly improved after abx completion. This episode pt notes his tonsils feel more swollen; also endorses subjective fevers/chills, body aches.  Denies cough, chest pain, shortness of breath, NVD, nasal congestion, ear pain/pressure. Recent Sick Contacts: Wife and 2 of his 3 children.  Patient Active Problem List   Diagnosis Date Noted  . DDD (degenerative disc disease), cervical 11/04/2017  . Varicocele 07/25/2016  . Neck pain 10/12/2015  . Panic attack as reaction to stress 06/02/2015  . ADD (attention deficit disorder) 05/30/2015  . Back pain, chronic 05/30/2015  . Chronic venous insufficiency 05/30/2015  . Abnormal antinuclear antibody titer 05/30/2015  . Leg varices 05/30/2015  . Migraine without aura and responsive to treatment 05/10/2009  . 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 Obesity (BMI 30-39.9) 08/08/2008    Social History   Tobacco Use  . Smoking status: Never Smoker  . Smokeless tobacco: Never Used  Substance Use Topics  . Alcohol use: Yes    Alcohol/week: 3.6 oz    Types: 6 Cans of beer per week     Current Outpatient Medications:  .  lisdexamfetamine (VYVANSE) 50 MG capsule, Take 1 capsule (50 mg total) by mouth every morning., Disp: 30 capsule, Rfl: 0 .  cyclobenzaprine (FLEXERIL) 10 MG tablet, Take 1 tablet (10 mg total) by mouth as needed. (Patient not taking: Reported on 01/19/2018), Disp: 30 tablet, Rfl: 2 .  magic mouthwash w/lidocaine SOLN, Take 5 mLs by mouth 4 (four) times daily as needed for mouth pain., Disp: 100 mL, Rfl: 0 .  penicillin v potassium (VEETID) 500 MG  tablet, Take 1 tablet (500 mg total) by mouth 2 (two) times daily. (Patient not taking: Reported on 01/19/2018), Disp: 20 tablet, Rfl: 0 .  predniSONE (DELTASONE) 20 MG tablet, Take 1 tablet (20 mg total) by mouth 2 (two) times daily with a meal for 3 days., Disp: 6 tablet, Rfl: 0  No Known Allergies  ROS  Ten systems reviewed and is negative except as mentioned in HPI  Objective  Vitals:   01/19/18 0929  BP: 120/70  Pulse: 95  Resp: 18  Temp: 98.4 F (36.9 C)  TempSrc: Oral  SpO2: 97%  Weight: 284 lb 12.8 oz (129.2 kg)  Height: 6\' 2"  (1.88 m)   Body mass index is 36.57 kg/m.  Nursing Note and Vital Signs reviewed.  Physical Exam  Constitutional: Patient appears well-developed and well-nourished. Obese. No distress.  HEENT: head atraumatic, normocephalic, pupils equal and reactive to light, EOM's intact, TM's without erythema or bulging, no maxillary or frontal sinus tenderness , neck supple without lymphadenopathy, oropharynx exhibits notably swollen uvula that is glossy and erythematous without exudate.  Cardiovascular: Normal rate, regular rhythm, S1/S2 present.  No murmur or rub heard. No BLE edema. Pulmonary/Chest: Effort normal and breath sounds clear. No respiratory distress or retractions. Psychiatric: Patient has a normal mood and affect. behavior is normal. Judgment and thought content normal.  Results for orders placed or performed in visit on 01/19/18 (from the past 72 hour(s))  POCT rapid strep A     Status: Normal   Collection Time: 01/19/18  9:51 AM  Result Value Ref Range   Rapid Strep A Screen Negative Negative    Assessment & Plan  1. Uvular swelling - predniSONE (DELTASONE) 20 MG tablet; Take 1 tablet (20 mg total) by mouth 2 (two) times daily with a meal for 3 days.  Dispense: 6 tablet; Refill: 0 - magic mouthwash w/lidocaine SOLN; Take 5 mLs by mouth 4 (four) times daily as needed for mouth pain.  Dispense: 100 mL; Refill: 0 - Alternate Ibuprofen  and Tylenol every 4 hours. - RTC in 1-2 days if not improving.  2. Sore throat - POCT rapid strep A - Negative  - Work note for today is provided. -Red flags and when to present for emergency care or RTC including fever >101.50F, chest pain, shortness of breath, choking, new/worsening/un-resolving symptoms, reviewed with patient at time of visit. Follow up and care instructions discussed and provided in AVS.

## 2018-01-19 NOTE — Patient Instructions (Addendum)
Advil 600mg  every 8 hours for swelling/pain.  May take Tylenol 650-1000mg  every 8 hours as needed for pain.  Make sure your last dose of prednisone is before 2-3pm.  If you are better tomorrow, you do not need to take the 3rd day.  If you worsen, please call our office immediately. If you are choking, or shortness of breath, you need to the ER.

## 2018-01-26 ENCOUNTER — Encounter: Payer: Self-pay | Admitting: Family Medicine

## 2018-01-26 ENCOUNTER — Ambulatory Visit: Payer: BLUE CROSS/BLUE SHIELD | Admitting: Family Medicine

## 2018-01-26 DIAGNOSIS — Z131 Encounter for screening for diabetes mellitus: Secondary | ICD-10-CM | POA: Diagnosis not present

## 2018-01-26 DIAGNOSIS — K1379 Other lesions of oral mucosa: Secondary | ICD-10-CM

## 2018-01-26 DIAGNOSIS — R5383 Other fatigue: Secondary | ICD-10-CM | POA: Diagnosis not present

## 2018-01-26 DIAGNOSIS — Z13220 Encounter for screening for lipoid disorders: Secondary | ICD-10-CM | POA: Diagnosis not present

## 2018-01-26 MED ORDER — MAGIC MOUTHWASH W/LIDOCAINE: 15.0000 mL | Freq: Four times a day (QID) | ORAL | 0 refills | Status: DC | PRN

## 2018-01-26 NOTE — Progress Notes (Signed)
Name: Christian Hamilton   MRN: [id number]    DOB: [birth year]   Date:01/26/2018       Progress Note  Subjective  Chief Complaint  Chief Complaint  Patient presents with  . Sore Throat    He continues to be bothered with his throat. He has tried penicillin and it resolved it for a couple of days and then it came back. He did not get the magic mouthwash because Kristopher Oppenheim did not know how to mix it. His son had been sick with ear infection.    HPI  Sore throat: seen by Raelyn Ensign, NP on 12/26/2017 for sore throat and diagnosed with strep per rapid test. He took 10 days of Pen.V-K, symptoms improved, however he returned on 01/19/2018 with fever, joint pain and sore throat again. He was seen and uvula was extremely swollen. He was given prednisone for 3 days and it improved symptoms, however as soon as he ran out of medication symptoms returned. No longer has a fever, but still has a sore throat and feels tired, missed two days of work last week. He did not change diet.  Denies SOB or wheezing. His appetite is good, but he does not want to eat because of pain. He never got mouthwash filled, but states gargling with warm salted water seems to help.    Patient Active Problem List   Diagnosis Date Noted  . DDD (degenerative disc disease), cervical 11/04/2017  . Varicocele 07/25/2016  . Neck pain 10/12/2015  . Panic attack as reaction to stress 06/02/2015  . ADD (attention deficit disorder) 05/30/2015  . Back pain, chronic 05/30/2015  . Chronic venous insufficiency 05/30/2015  . Abnormal antinuclear antibody titer 05/30/2015  . Leg varices 05/30/2015  . Migraine without aura and responsive to treatment 05/10/2009  . 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 Obesity (BMI 30-39.9) 08/08/2008    Past Surgical History:  Procedure Laterality Date  . LEG SURGERY    . SHOULDER SURGERY    . TONSILECTOMY, ADENOIDECTOMY, BILATERAL MYRINGOTOMY AND TUBES    . tubes in ears      Family  History  Problem Relation Age of Onset  . Kidney disease Father   . ADD / ADHD Brother     Social History   Socioeconomic History  . Marital status: Married    Spouse name: [name]  . Number of children: 3  . Years of education: Not on file  . Highest education level: Not on file  Occupational History  . Occupation: Freight forwarder    Comment: [employer]  Social Needs  . Financial resource strain: Not on file  . Food insecurity:    Worry: Not on file    Inability: Not on file  . Transportation needs:    Medical: Not on file    Non-medical: Not on file  Tobacco Use  . Smoking status: Never Smoker  . Smokeless tobacco: Never Used  Substance and Sexual Activity  . Alcohol use: Yes    Alcohol/week: 3.6 oz    Types: 6 Cans of beer per week  . Drug use: No  . Sexual activity: Yes  Lifestyle  . Physical activity:    Days per week: Not on file    Minutes per session: Not on file  . Stress: Not on file  Relationships  . Social connections:    Talks on phone: Not on file    Gets together: Not on file    Attends religious service: Not  on file    Active member of club or organization: Not on file    Attends meetings of clubs or organizations: Not on file    Relationship status: Not on file  . Intimate partner violence:    Fear of current or ex partner: Not on file    Emotionally abused: Not on file    Physically abused: Not on file    Forced sexual activity: Not on file  Other Topics Concern  . Not on file  Social History Narrative   He is married and has 3 children     Current Outpatient Medications:  .  lisdexamfetamine (VYVANSE) 50 MG capsule, Take 1 capsule (50 mg total) by mouth every morning., Disp: 30 capsule, Rfl: 0 .  cyclobenzaprine (FLEXERIL) 10 MG tablet, Take 1 tablet (10 mg total) by mouth as needed. (Patient not taking: Reported on 01/19/2018), Disp: 30 tablet, Rfl: 2 .  magic mouthwash w/lidocaine SOLN, Take 15 mLs by mouth 4 (four) times daily as  needed for mouth pain., Disp: 100 mL, Rfl: 0  No Known Allergies   ROS  Constitutional: Negative for fever or weight change.  Respiratory: Negative for cough and shortness of breath.   Cardiovascular: Negative for chest pain or palpitations.  Gastrointestinal: Negative for abdominal pain, no bowel changes.  Musculoskeletal: Negative for gait problem or joint swelling.  Skin: Negative for rash.  Neurological: Negative for dizziness or headache.  No other specific complaints in a complete review of systems (except as listed in HPI above).  Objective  Vitals:   01/26/18 0918  BP: 112/80  Pulse: 74  Resp: 14  Temp: 97.8 F (36.6 C)  TempSrc: Oral  SpO2: 98%  Weight: 281 lb 9.6 oz (127.7 kg)  Height: 6\' 2"  (1.88 m)    Body mass index is 36.16 kg/m.  Physical Exam  Constitutional: Patient appears well-developed and well-nourished. Obese  No distress.  HEENT: head atraumatic, normocephalic, pupils equal and reactive to light, ears normal TM, uvula very swollen, red and also soft palate also red and swollen, neck supple, throat within normal limits, no lymphadenopathy  Cardiovascular: Normal rate, regular rhythm and normal heart sounds.  No murmur heard. No BLE edema. Pulmonary/Chest: Effort normal and breath sounds normal. No respiratory distress. Abdominal: Soft.  There is no tenderness. Psychiatric: Patient has a normal mood and affect. behavior is normal. Judgment and thought content normal.  Recent Results (from the past 2160 hour(s))  POCT rapid strep A     Status: Abnormal   Collection Time: 12/26/17  9:42 AM  Result Value Ref Range   Rapid Strep A Screen Positive (A) Negative  POCT rapid strep A     Status: Normal   Collection Time: 01/19/18  9:51 AM  Result Value Ref Range   Rapid Strep A Screen Negative Negative     PHQ2/9: Depression screen [facility] 2/9 11/26/2017 07/09/2017 06/05/2017 01/23/2017 11/01/2016  Decreased Interest 0 0 0 0 0  Down, Depressed, Hopeless 0 0  0 0 0  PHQ - 2 Score 0 0 0 0 0  Altered sleeping - 0 - - -  Tired, decreased energy - 0 - - -  Change in appetite - 0 - - -  Feeling bad or failure about yourself  - 0 - - -  Trouble concentrating - 0 - - -  Moving slowly or fidgety/restless - 0 - - -  Suicidal thoughts - 0 - - -  PHQ-9 Score - 0 - - -  Difficult doing work/chores - Not difficult at all - - -     Fall Risk: Fall Risk  01/26/2018 12/26/2017 11/26/2017 10/10/2017 06/05/2017  Falls in the past year? No No No No No    Functional Status Survey: Is the patient deaf or have difficulty hearing?: No Does the patient have difficulty seeing, even when wearing glasses/contacts?: No Does the patient have difficulty concentrating, remembering, or making decisions?: No Does the patient have difficulty walking or climbing stairs?: No Does the patient have difficulty dressing or bathing?: No Does the patient have difficulty doing errands alone such as visiting a doctor's office or shopping?: No    Assessment & Plan  1. Uvular swelling  - magic mouthwash w/lidocaine SOLN; Take 15 mLs by mouth 4 (four) times daily as needed for mouth pain.  Dispense: 100 mL; Refill: 0 - Ambulatory referral to ENT  Recurrent. Needs further evaluation, discussed importance of calling 911 if swelling causes difficulty breathing.

## 2018-01-29 ENCOUNTER — Other Ambulatory Visit: Payer: Self-pay | Admitting: Family Medicine

## 2018-01-29 DIAGNOSIS — R070 Pain in throat: Secondary | ICD-10-CM | POA: Diagnosis not present

## 2018-01-29 DIAGNOSIS — F900 Attention-deficit hyperactivity disorder, predominantly inattentive type: Secondary | ICD-10-CM

## 2018-01-29 DIAGNOSIS — J028 Acute pharyngitis due to other specified organisms: Secondary | ICD-10-CM | POA: Diagnosis not present

## 2018-01-29 LAB — CBC WITH DIFFERENTIAL/PLATELET
BASOS ABS: 53 {cells}/uL (ref 0–200)
Basophils Relative: 0.6 %
EOS ABS: 187 {cells}/uL (ref 15–500)
Eosinophils Relative: 2.1 %
HCT: 46.7 % (ref 38.5–50.0)
Hemoglobin: 16.1 g/dL (ref 13.2–17.1)
Lymphs Abs: 2563 cells/uL (ref 850–3900)
MCH: 31.2 pg (ref 27.0–33.0)
MCHC: 34.5 g/dL (ref 32.0–36.0)
MCV: 90.5 fL (ref 80.0–100.0)
MPV: 11.2 fL (ref 7.5–12.5)
Monocytes Relative: 9 %
Neutro Abs: 5296 cells/uL (ref 1500–7800)
Neutrophils Relative %: 59.5 %
PLATELETS: 227 10*3/uL (ref 140–400)
RBC: 5.16 10*6/uL (ref 4.20–5.80)
RDW: 12 % (ref 11.0–15.0)
TOTAL LYMPHOCYTE: 28.8 %
WBC mixed population: 801 cells/uL (ref 200–950)
WBC: 8.9 10*3/uL (ref 3.8–10.8)

## 2018-01-29 LAB — COMPLETE METABOLIC PANEL WITH GFR
AG Ratio: 1.8 (calc) (ref 1.0–2.5)
ALBUMIN MSPROF: 4.3 g/dL (ref 3.6–5.1)
ALKALINE PHOSPHATASE (APISO): 54 U/L (ref 40–115)
ALT: 19 U/L (ref 9–46)
AST: 16 U/L (ref 10–40)
BUN: 10 mg/dL (ref 7–25)
CALCIUM: 9.1 mg/dL (ref 8.6–10.3)
CO2: 32 mmol/L (ref 20–32)
CREATININE: 0.81 mg/dL (ref 0.60–1.35)
Chloride: 101 mmol/L (ref 98–110)
GFR, EST NON AFRICAN AMERICAN: 117 mL/min/{1.73_m2} (ref >=60)
GFR, Est African American: 135 mL/min/{1.73_m2} (ref >=60)
GLUCOSE: 92 mg/dL (ref 65–99)
Globulin: 2.4 g/dL (calc) (ref 1.9–3.7)
Potassium: 4.4 mmol/L (ref 3.5–5.3)
Sodium: 140 mmol/L (ref 135–146)
Total Bilirubin: 0.5 mg/dL (ref 0.2–1.2)
Total Protein: 6.7 g/dL (ref 6.1–8.1)

## 2018-01-29 LAB — LIPID PANEL
CHOLESTEROL: 161 mg/dL (ref <200)
HDL: 50 mg/dL (ref >40)
LDL CHOLESTEROL (CALC): 97 mg/dL
Non-HDL Cholesterol (Calc): 111 mg/dL (calc) (ref <130)
Total CHOL/HDL Ratio: 3.2 (calc) (ref <5.0)
Triglycerides: 59 mg/dL (ref <150)

## 2018-01-29 LAB — HEMOGLOBIN A1C
Hgb A1c MFr Bld: 5.4 % of total Hgb (ref <5.7)
Mean Plasma Glucose: 108 (calc)
eAG (mmol/L): 6 (calc)

## 2018-01-29 LAB — VITAMIN D 25 HYDROXY (VIT D DEFICIENCY, FRACTURES): Vit D, 25-Hydroxy: 17 ng/mL — ABNORMAL LOW (ref 30–100)

## 2018-01-29 LAB — TSH: TSH: 0.78 m[IU]/L (ref 0.40–4.50)

## 2018-01-29 LAB — MONONUCLEOSIS SCREEN: HETEROPHILE, MONO SCREEN: NEGATIVE

## 2018-01-29 LAB — VITAMIN B12: Vitamin B-12: 569 pg/mL (ref 200–1100)

## 2018-01-29 MED ORDER — LISDEXAMFETAMINE DIMESYLATE 50 MG PO CAPS: 50.0000 mg | ORAL_CAPSULE | ORAL | 0 refills | Status: DC

## 2018-01-29 NOTE — Telephone Encounter (Signed)
Refill request for general medication. Vyvanse to [facility].   Last office visit: 01/26/2018   Follow up on 02/27/2018

## 2018-02-27 ENCOUNTER — Encounter: Payer: Self-pay | Admitting: Family Medicine

## 2018-02-27 ENCOUNTER — Ambulatory Visit: Payer: BLUE CROSS/BLUE SHIELD | Admitting: Family Medicine

## 2018-02-27 VITALS — BP 136/82 | HR 66 | Temp 97.8°F | Resp 16 | Ht 74.0 in | Wt 277.5 lb

## 2018-02-27 DIAGNOSIS — F900 Attention-deficit hyperactivity disorder, predominantly inattentive type: Secondary | ICD-10-CM

## 2018-02-27 DIAGNOSIS — M5412 Radiculopathy, cervical region: Secondary | ICD-10-CM | POA: Diagnosis not present

## 2018-02-27 DIAGNOSIS — E669 Obesity, unspecified: Secondary | ICD-10-CM | POA: Diagnosis not present

## 2018-02-27 DIAGNOSIS — I872 Venous insufficiency (chronic) (peripheral): Secondary | ICD-10-CM

## 2018-02-27 MED ORDER — LISDEXAMFETAMINE DIMESYLATE 50 MG PO CAPS: 50.0000 mg | ORAL_CAPSULE | Freq: Every day | ORAL | 0 refills | Status: DC

## 2018-02-27 MED ORDER — LISDEXAMFETAMINE DIMESYLATE 50 MG PO CAPS: 50.0000 mg | ORAL_CAPSULE | ORAL | 0 refills | Status: DC

## 2018-02-27 NOTE — Progress Notes (Signed)
Name: Christian Hamilton   MRN: [id number]    DOB: 1986-03-11   Date:02/27/2018       Progress Note  Subjective  Chief Complaint  Chief Complaint  Patient presents with  . ADHD    Doing well with medication only side effect is dry mouth-has losted 5 pounds  . Follow-up    HPI   Neck pain: he was seen Fall 2018, given prednisone, symptoms did not resolve, under workman's comp now, had MRI and showed herniated disc, he finished PT without much improvement, he still has paresthesia on both arms, seen by Dr. Sharyne Richters and is going to have steroid injections to see if improve symptoms  ADD: hewastaking Adderall XR in am's and Adderall 10 mg at lunch,however noticed increase in irritability and bp elevation. He stopped medication  Summer 2018 and states he was drinking  caffeine to be able to focus and stay awake, but less irritable. He is now on Vyvanse and is doing well, states no side effects, able to focus, bp is better controlled, moving now and will look for another job in the near future.   Varicose veins: no recent problems, no pain, occasionally swelling of left leg when he stands for over 12 hours- He usually works long shifts, discussed compression sleeve or hoses again  Obesity:he has gradually been losing weight, he was 296 lbs January 2019 and is down 19 lbs. Avoiding proceed sugars, chips and beer. He is feeling well at this time.    Patient Active Problem List   Diagnosis Date Noted  . DDD (degenerative disc disease), cervical 11/04/2017  . Varicocele 07/25/2016  . Neck pain 10/12/2015  . Panic attack as reaction to stress 06/02/2015  . ADD (attention deficit disorder) 05/30/2015  . Back pain, chronic 05/30/2015  . Chronic venous insufficiency 05/30/2015  . Abnormal antinuclear antibody titer 05/30/2015  . Leg varices 05/30/2015  . Migraine without aura and responsive to treatment 05/10/2009  . 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 Obesity (BMI 30-39.9)  08/08/2008    Past Surgical History:  Procedure Laterality Date  . LEG SURGERY    . SHOULDER SURGERY    . TONSILECTOMY, ADENOIDECTOMY, BILATERAL MYRINGOTOMY AND TUBES    . tubes in ears      Family History  Problem Relation Age of Onset  . Kidney disease Father   . ADD / ADHD Brother     Social History   Socioeconomic History  . Marital status: Married    Spouse name: [name]  . Number of children: 3  . Years of education: Not on file  . Highest education level: Not on file  Occupational History  . Occupation: Freight forwarder    Comment: [employer]  Social Needs  . Financial resource strain: Not on file  . Food insecurity:    Worry: Not on file    Inability: Not on file  . Transportation needs:    Medical: Not on file    Non-medical: Not on file  Tobacco Use  . Smoking status: Never Smoker  . Smokeless tobacco: Never Used  Substance and Sexual Activity  . Alcohol use: Yes    Alcohol/week: 3.6 oz    Types: 6 Cans of beer per week  . Drug use: No  . Sexual activity: Yes  Lifestyle  . Physical activity:    Days per week: Not on file    Minutes per session: Not on file  . Stress: Not on file  Relationships  .  Social connections:    Talks on phone: Not on file    Gets together: Not on file    Attends religious service: Not on file    Active member of club or organization: Not on file    Attends meetings of clubs or organizations: Not on file    Relationship status: Not on file  . Intimate partner violence:    Fear of current or ex partner: Not on file    Emotionally abused: Not on file    Physically abused: Not on file    Forced sexual activity: Not on file  Other Topics Concern  . Not on file  Social History Narrative   He is married and has 3 children     Current Outpatient Medications:  .  lisdexamfetamine (VYVANSE) 50 MG capsule, Take 1 capsule (50 mg total) by mouth every morning., Disp: 30 capsule, Rfl: 0 .  lisdexamfetamine (VYVANSE) 50 MG  capsule, Take 1 capsule (50 mg total) by mouth daily. Fill 03/29/2018, Disp: 30 capsule, Rfl: 0 .  lisdexamfetamine (VYVANSE) 50 MG capsule, Take 1 capsule (50 mg total) by mouth daily. Fill 04/27/2018, Disp: 30 capsule, Rfl: 0  No Known Allergies   ROS  Ten systems reviewed and is negative except as mentioned in HPI   Objective  Vitals:   02/27/18 1018  BP: 136/82  Pulse: 66  Resp: 16  Temp: 97.8 F (36.6 C)  TempSrc: Oral  SpO2: 96%  Weight: 277 lb 8 oz (125.9 kg)  Height: 6\' 2"  (1.88 m)    Body mass index is 35.63 kg/m.  Physical Exam  Constitutional: Patient appears well-developed and well-nourished. Obese  No distress.  HEENT: head atraumatic, normocephalic, pupils equal and reactive to light,  neck supple, throat within normal limits Cardiovascular: Normal rate, regular rhythm and normal heart sounds.  No murmur heard. No BLE edema. Pulmonary/Chest: Effort normal and breath sounds normal. No respiratory distress. Muscular Skeletal: normal rom of knees, normal grip, no weakness Abdominal: Soft.  There is no tenderness. Psychiatric: Patient has a normal mood and affect. behavior is normal. Judgment and thought content normal.  Recent Results (from the past 2160 hour(s))  POCT rapid strep A     Status: Abnormal   Collection Time: 12/26/17  9:42 AM  Result Value Ref Range   Rapid Strep A Screen Positive (A) Negative  POCT rapid strep A     Status: Normal   Collection Time: 01/19/18  9:51 AM  Result Value Ref Range   Rapid Strep A Screen Negative Negative  COMPLETE METABOLIC PANEL WITH GFR     Status: None   Collection Time: 01/26/18  9:49 AM  Result Value Ref Range   Glucose, Bld 92 65 - 99 mg/dL    Comment: .            Fasting reference interval .    BUN 10 7 - 25 mg/dL   Creat 0.81 0.60 - 1.35 mg/dL   GFR, Est Non African American 117 > OR = 60 mL/min/1.68m2   GFR, Est African American 135 > OR = 60 mL/min/1.35m2   BUN/Creatinine Ratio NOT APPLICABLE 6  - 22 (calc)   Sodium 140 135 - 146 mmol/L   Potassium 4.4 3.5 - 5.3 mmol/L   Chloride 101 98 - 110 mmol/L   CO2 32 20 - 32 mmol/L   Calcium 9.1 8.6 - 10.3 mg/dL   Total Protein 6.7 6.1 - 8.1 g/dL   Albumin 4.3 3.6 - 5.1  g/dL   Globulin 2.4 1.9 - 3.7 g/dL (calc)   AG Ratio 1.8 1.0 - 2.5 (calc)   Total Bilirubin 0.5 0.2 - 1.2 mg/dL   Alkaline phosphatase (APISO) 54 40 - 115 U/L   AST 16 10 - 40 U/L   ALT 19 9 - 46 U/L  Hemoglobin A1c     Status: None   Collection Time: 01/26/18  9:49 AM  Result Value Ref Range   Hgb A1c MFr Bld 5.4 <5.7 % of total Hgb    Comment: For the purpose of screening for the presence of diabetes: . <5.7%       Consistent with the absence of diabetes 5.7-6.4%    Consistent with increased risk for diabetes             (prediabetes) > or =6.5%  Consistent with diabetes . This assay result is consistent with a decreased risk of diabetes. . Currently, no consensus exists regarding use of hemoglobin A1c for diagnosis of diabetes in children. . According to American Diabetes Association (ADA) guidelines, hemoglobin A1c <7.0% represents optimal control in non-pregnant diabetic patients. Different metrics may apply to specific patient populations.  Standards of Medical Care in Diabetes(ADA). .    Mean Plasma Glucose 108 (calc)   eAG (mmol/L) 6.0 (calc)  Lipid panel     Status: None   Collection Time: 01/26/18  9:49 AM  Result Value Ref Range   Cholesterol 161 <200 mg/dL   HDL 50 >40 mg/dL   Triglycerides 59 <150 mg/dL   LDL Cholesterol (Calc) 97 mg/dL (calc)    Comment: Reference range: <100 . Desirable range <100 mg/dL for primary prevention;   <70 mg/dL for patients with CHD or diabetic patients  with > or = 2 CHD risk factors. Marland Kitchen LDL-C is now calculated using the Martin-Hopkins  calculation, which is a validated novel method providing  better accuracy than the Friedewald equation in the  estimation of LDL-C.  Cresenciano Genre et al. Annamaria Helling.  2423;536(14): 2061-2068  (http://education.[facility].com/faq/FAQ164)    Total CHOL/HDL Ratio 3.2 <5.0 (calc)   Non-HDL Cholesterol (Calc) 111 <130 mg/dL (calc)    Comment: For patients with diabetes plus 1 major ASCVD risk  factor, treating to a non-HDL-C goal of <100 mg/dL  (LDL-C of <70 mg/dL) is considered a therapeutic  option.   CBC with Differential/Platelet     Status: None   Collection Time: 01/26/18  9:49 AM  Result Value Ref Range   WBC 8.9 3.8 - 10.8 Thousand/uL   RBC 5.16 4.20 - 5.80 Million/uL   Hemoglobin 16.1 13.2 - 17.1 g/dL   HCT 46.7 38.5 - 50.0 %   MCV 90.5 80.0 - 100.0 fL   MCH 31.2 27.0 - 33.0 pg   MCHC 34.5 32.0 - 36.0 g/dL   RDW 12.0 11.0 - 15.0 %   Platelets 227 140 - 400 Thousand/uL   MPV 11.2 7.5 - 12.5 fL   Neutro Abs 5,296 1,500 - 7,800 cells/uL   Lymphs Abs 2,563 850 - 3,900 cells/uL   WBC mixed population 801 200 - 950 cells/uL   Eosinophils Absolute 187 15 - 500 cells/uL   Basophils Absolute 53 0 - 200 cells/uL   Neutrophils Relative % 59.5 %   Total Lymphocyte 28.8 %   Monocytes Relative 9.0 %   Eosinophils Relative 2.1 %   Basophils Relative 0.6 %  TSH     Status: None   Collection Time: 01/26/18  9:49 AM  Result Value Ref Range  TSH 0.78 0.40 - 4.50 mIU/L  VITAMIN D 25 Hydroxy (Vit-D Deficiency, Fractures)     Status: Abnormal   Collection Time: 01/26/18  9:49 AM  Result Value Ref Range   Vit D, 25-Hydroxy 17 (L) 30 - 100 ng/mL    Comment: Vitamin D Status         25-OH Vitamin D: . Deficiency:                    <20 ng/mL Insufficiency:             20 - 29 ng/mL Optimal:                 > or = 30 ng/mL . For 25-OH Vitamin D testing on patients on  D2-supplementation and patients for whom quantitation  of D2 and D3 fractions is required, the [facility](TM) 25-OH VIT D, (D2,D3), LC/MS/MS is recommended: order  code [id number] (patients >95yrs). . For more information on this test, go  to: http://education.[facility].com/faq/FAQ163 (This link is being provided for  informational/educational purposes only.)   Vitamin B12     Status: None   Collection Time: 01/26/18  9:49 AM  Result Value Ref Range   Vitamin B-12 569 200 - 1,100 pg/mL  Mononucleosis screen     Status: None   Collection Time: 01/26/18  9:49 AM  Result Value Ref Range   Heterophile, Mono Screen NEGATIVE NEGATIVE     PHQ2/9: Depression screen [facility] 2/9 02/27/2018 11/26/2017 07/09/2017 06/05/2017 01/23/2017  Decreased Interest 0 0 0 0 0  Down, Depressed, Hopeless 0 0 0 0 0  PHQ - 2 Score 0 0 0 0 0  Altered sleeping - - 0 - -  Tired, decreased energy - - 0 - -  Change in appetite - - 0 - -  Feeling bad or failure about yourself  - - 0 - -  Trouble concentrating - - 0 - -  Moving slowly or fidgety/restless - - 0 - -  Suicidal thoughts - - 0 - -  PHQ-9 Score - - 0 - -  Difficult doing work/chores - - Not difficult at all - -     Fall Risk: Fall Risk  02/27/2018 01/26/2018 12/26/2017 11/26/2017 10/10/2017  Falls in the past year? No No No No No     Functional Status Survey: Is the patient deaf or have difficulty hearing?: No Does the patient have difficulty seeing, even when wearing glasses/contacts?: No Does the patient have difficulty concentrating, remembering, or making decisions?: No Does the patient have difficulty walking or climbing stairs?: No Does the patient have difficulty dressing or bathing?: No Does the patient have difficulty doing errands alone such as visiting a doctor's office or shopping?: No    Assessment & Plan  1. Attention deficit hyperactivity disorder (ADHD), predominantly inattentive type  - lisdexamfetamine (VYVANSE) 50 MG capsule; Take 1 capsule (50 mg total) by mouth every morning.  Dispense: 30 capsule; Refill: 0  2. Obesity (BMI 30-39.9)  Discussed with the patient the risk posed by an increased BMI. Discussed importance of portion control, calorie counting and  at least 150 minutes of physical activity weekly. Avoid sweet beverages and drink more water. Eat at least 6 servings of fruit and vegetables daily   3. Chronic venous insufficiency   4. Cervical radiculitis  Seeing Dr. Ma Rings

## 2018-06-02 ENCOUNTER — Ambulatory Visit: Payer: BLUE CROSS/BLUE SHIELD | Admitting: Family Medicine

## 2018-06-02 ENCOUNTER — Encounter: Payer: Self-pay | Admitting: Family Medicine

## 2018-06-02 VITALS — BP 128/76 | HR 107 | Temp 98.7°F | Resp 16 | Ht 74.0 in | Wt 270.4 lb

## 2018-06-02 DIAGNOSIS — F900 Attention-deficit hyperactivity disorder, predominantly inattentive type: Secondary | ICD-10-CM

## 2018-06-02 DIAGNOSIS — R1032 Left lower quadrant pain: Secondary | ICD-10-CM | POA: Diagnosis not present

## 2018-06-02 DIAGNOSIS — K625 Hemorrhage of anus and rectum: Secondary | ICD-10-CM

## 2018-06-02 DIAGNOSIS — R1031 Right lower quadrant pain: Secondary | ICD-10-CM

## 2018-06-02 MED ORDER — HYDROCORTISONE ACETATE 25 MG RE SUPP: 25.0000 mg | Freq: Two times a day (BID) | RECTAL | 0 refills | Status: DC

## 2018-06-02 MED ORDER — AMPHETAMINE-DEXTROAMPHET ER 25 MG PO CP24: 25.0000 mg | ORAL_CAPSULE | ORAL | 0 refills | Status: DC

## 2018-06-02 NOTE — Progress Notes (Signed)
Name: Christian Hamilton   MRN: [id number]    DOB: [birth year]   Date:06/02/2018       Progress Note  Subjective  Chief Complaint  Chief Complaint  Patient presents with  . Medication Refill    Recently changed his diet and start having bowel issues.l  . ADHD    States the medication does not seem as effective as before-states he is eating healthier and has lost 8 pounds since last visit. But does not understand why it is not being as effective as before.   . Rectal Bleeding    Onset-1 month, yellow discharge and yellow stool-itching and burning at first. Even has blood when he has to go to bathroom when wiping.    HPI  ADHD: he states he has noticed that Vyvanse no longer seems to work and is too expensive, he would like to try Adderal XR again. 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   Rectal bleeding: going on for a few months now, he denies tenesmus, but has some mucus on his underwear about one hour after bowel movements. No diarrhea or change in bowel movements. Blood on toilette paper when he wipes. No fever or chills, no family history of colitis. He states his anus was initially itchy but now feels painful when he wipes. He has a history of external hemorrhoids but it feels different this time. He has also noticed intermittent lower abdominal pain that seems worse after meals. Decrease in appetite and weight loss   Patient Active Problem List   Diagnosis Date Noted  . DDD (degenerative disc disease), cervical 11/04/2017  . Varicocele 07/25/2016  . Neck pain 10/12/2015  . Panic attack as reaction to stress 06/02/2015  . ADD (attention deficit disorder) 05/30/2015  . Back pain, chronic 05/30/2015  . Chronic venous insufficiency 05/30/2015  . Abnormal antinuclear antibody titer 05/30/2015  . Leg varices 05/30/2015  . Migraine without aura and responsive to treatment 05/10/2009  . Transient arterial occlusion of retina  05/10/2009  . Obesity (BMI 30-39.9) 08/08/2008    Past Surgical History:  Procedure Laterality Date  . LEG SURGERY    . SHOULDER SURGERY    . TONSILECTOMY, ADENOIDECTOMY, BILATERAL MYRINGOTOMY AND TUBES    . tubes in ears      Family History  Problem Relation Age of Onset  . Kidney disease Father   . ADD / ADHD Brother     Social History   Socioeconomic History  . Marital status: Married    Spouse name: [name]  . Number of children: 3  . Years of education: Not on file  . Highest education level: Not on file  Occupational History  . Occupation: Freight forwarder    Comment: [employer]  Social Needs  . Financial resource strain: Not on file  . Food insecurity:    Worry: Not on file    Inability: Not on file  . Transportation needs:    Medical: Not on file    Non-medical: Not on file  Tobacco Use  . Smoking status: Never Smoker  . Smokeless tobacco: Never Used  Substance and Sexual Activity  . Alcohol use: Yes    Alcohol/week: 3.6 oz    Types: 6 Cans of beer per week  . Drug use: No  . Sexual activity: Yes  Lifestyle  . Physical activity:    Days per week: Not on file    Minutes per session: Not on  file  . Stress: Not on file  Relationships  . Social connections:    Talks on phone: Not on file    Gets together: Not on file    Attends religious service: Not on file    Active member of club or organization: Not on file    Attends meetings of clubs or organizations: Not on file    Relationship status: Not on file  . Intimate partner violence:    Fear of current or ex partner: Not on file    Emotionally abused: Not on file    Physically abused: Not on file    Forced sexual activity: Not on file  Other Topics Concern  . Not on file  Social History Narrative   He is married and has 3 children     Current Outpatient Medications:  .  lisdexamfetamine (VYVANSE) 50 MG capsule, Take 1 capsule (50 mg total) by mouth every morning., Disp: 30 capsule, Rfl: 0 .   lisdexamfetamine (VYVANSE) 50 MG capsule, Take 1 capsule (50 mg total) by mouth daily. Fill 03/29/2018, Disp: 30 capsule, Rfl: 0 .  lisdexamfetamine (VYVANSE) 50 MG capsule, Take 1 capsule (50 mg total) by mouth daily. Fill 04/27/2018, Disp: 30 capsule, Rfl: 0  No Known Allergies   ROS  Constitutional: Negative for fever, positive for  weight change.  Respiratory: Negative for cough and shortness of breath.   Cardiovascular: Negative for chest pain or palpitations.  Gastrointestinal: Negative for abdominal pain, no bowel changes.  Musculoskeletl: Negative for gait problem or joint swelling.  Skin: Negative for rash.  Neurological: Negative for dizziness or headache.  No other specific complaints in a complete review of systems (except as listed in HPI above).   Objective  Vitals:   06/02/18 1040  BP: 128/76  Pulse: (!) 107  Resp: 16  Temp: 98.7 F (37.1 C)  TempSrc: Oral  SpO2: 98%  Weight: 270 lb 6.4 oz (122.7 kg)  Height: 6\' 2"  (1.88 m)    Body mass index is 34.72 kg/m.  Physical Exam  Constitutional: Patient appears well-developed and well-nourished. Obese  No distress.  HEENT: head atraumatic, normocephalic, pupils equal and reactive to light, neck supple, throat within normal limits Cardiovascular: Normal rate, regular rhythm and normal heart sounds.  No murmur heard. No BLE edema. Pulmonary/Chest: Effort normal and breath sounds normal. No respiratory distress. Abdominal: Soft.  There is lower abdominal  Tenderness, anal area is erythematous and seems irritated, no blood on glove, no stools in rectal vault, normal anal tonus. Possible bulging internal hemorrhoids . Psychiatric: Patient has a normal mood and affect. behavior is normal. Judgment and thought content normal.   PHQ2/9: Depression screen [facility] 2/9 06/02/2018 02/27/2018 11/26/2017 07/09/2017 06/05/2017  Decreased Interest 0 0 0 0 0  Down, Depressed, Hopeless 0 0 0 0 0  PHQ - 2 Score 0 0 0 0 0  Altered sleeping  0 - - 0 -  Tired, decreased energy 1 - - 0 -  Change in appetite 0 - - 0 -  Feeling bad or failure about yourself  0 - - 0 -  Trouble concentrating 2 - - 0 -  Moving slowly or fidgety/restless 0 - - 0 -  Suicidal thoughts 0 - - 0 -  PHQ-9 Score 3 - - 0 -  Difficult doing work/chores Somewhat difficult - - Not difficult at all -     Fall Risk: Fall Risk  06/02/2018 02/27/2018 01/26/2018 12/26/2017 11/26/2017  Falls in the past year?  No No No No No    Functional Status Survey: Is the patient deaf or have difficulty hearing?: No Does the patient have difficulty seeing, even when wearing glasses/contacts?: No Does the patient have difficulty concentrating, remembering, or making decisions?: No Does the patient have difficulty walking or climbing stairs?: No Does the patient have difficulty dressing or bathing?: No Does the patient have difficulty doing errands alone such as visiting a doctor's office or shopping?: No    Assessment & Plan  1. Rectal bleeding  - CBC with Differential/Platelet - Sedimentation rate - C-reactive protein - hydrocortisone (ANUSOL-HC) 25 MG suppository; Place 1 suppository (25 mg total) rectally 2 (two) times daily.  Dispense: 10 suppository; Refill: 0 - Ambulatory referral to Gastroenterology  2. Bilateral lower abdominal cramping  - CBC with Differential/Platelet - Sedimentation rate - C-reactive protein - hydrocortisone (ANUSOL-HC) 25 MG suppository; Place 1 suppository (25 mg total) rectally 2 (two) times daily.  Dispense: 10 suppository; Refill: 0 - Ambulatory referral to Gastroenterology  3. Attention deficit hyperactivity disorder (ADHD), predominantly inattentive type  - amphetamine-dextroamphetamine (ADDERALL XR) 25 MG 24 hr capsule; Take 1 capsule by mouth every morning.  Dispense: 30 capsule; Refill: 0

## 2018-06-03 LAB — CBC WITH DIFFERENTIAL/PLATELET
Basophils Absolute: 58 cells/uL (ref 0–200)
Basophils Relative: 0.8 %
EOS ABS: 130 {cells}/uL (ref 15–500)
Eosinophils Relative: 1.8 %
HEMATOCRIT: 45.3 % (ref 38.5–50.0)
Hemoglobin: 15.6 g/dL (ref 13.2–17.1)
LYMPHS ABS: 1908 {cells}/uL (ref 850–3900)
MCH: 31.5 pg (ref 27.0–33.0)
MCHC: 34.4 g/dL (ref 32.0–36.0)
MCV: 91.5 fL (ref 80.0–100.0)
MPV: 11.8 fL (ref 7.5–12.5)
Monocytes Relative: 9.7 %
Neutro Abs: 4406 cells/uL (ref 1500–7800)
Neutrophils Relative %: 61.2 %
Platelets: 174 10*3/uL (ref 140–400)
RBC: 4.95 10*6/uL (ref 4.20–5.80)
RDW: 11.9 % (ref 11.0–15.0)
Total Lymphocyte: 26.5 %
WBC: 7.2 10*3/uL (ref 3.8–10.8)
WBCMIX: 698 {cells}/uL (ref 200–950)

## 2018-06-03 LAB — C-REACTIVE PROTEIN: CRP: 1.4 mg/L (ref <8.0)

## 2018-06-03 LAB — SEDIMENTATION RATE: Sed Rate: 2 mm/h (ref 0–15)

## 2018-06-15 IMAGING — CR DG CHEST 2V
1 series · 2 of 2 positions shown · non-contrast
Comparison: None.

CLINICAL DATA: Acute chest pain and shortness of breath yesterday.

EXAM:
CHEST  2 VIEW

[Series 1: dg chest 2 view · 0.14mm/px · 2 of 2 slices shown]
[im 1/2]
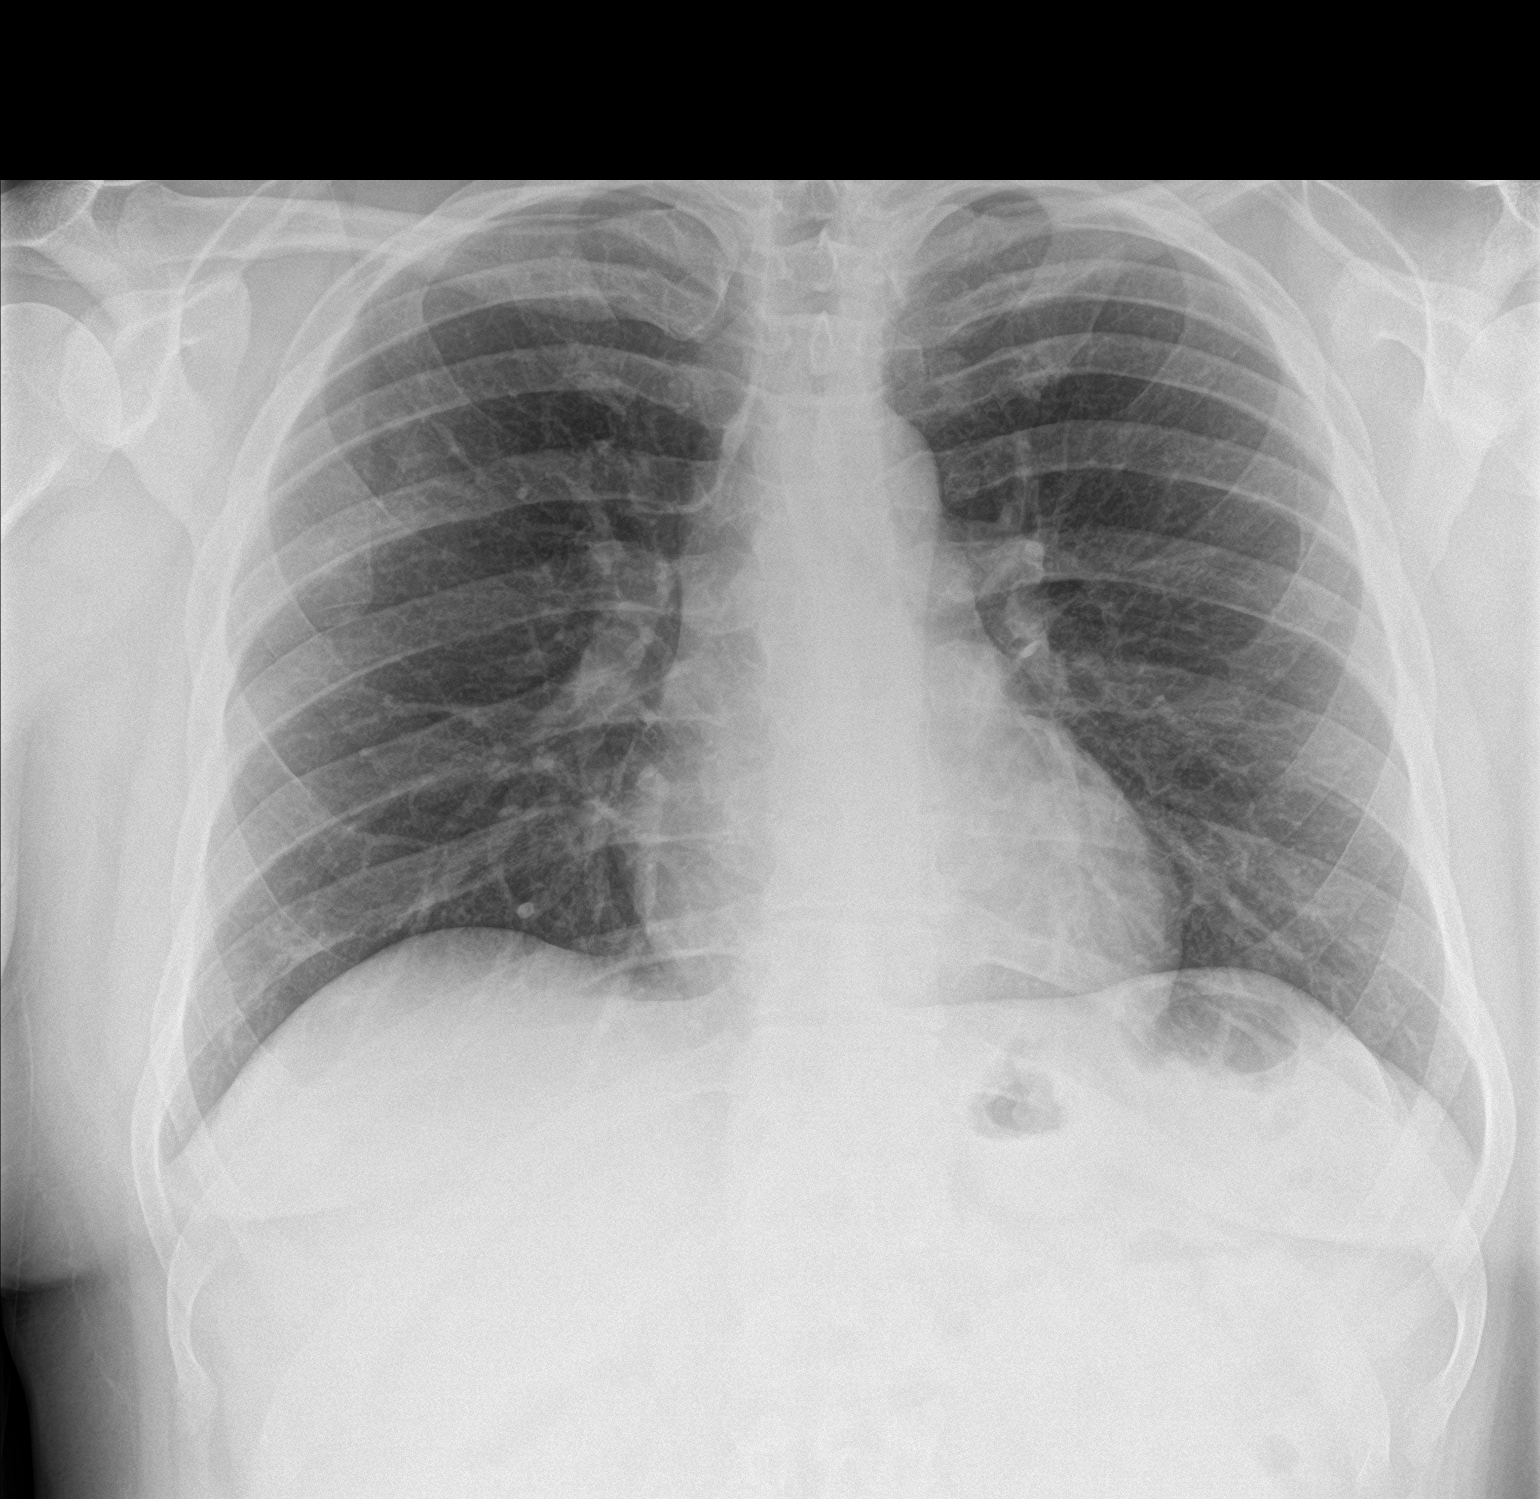
[im 2/2]
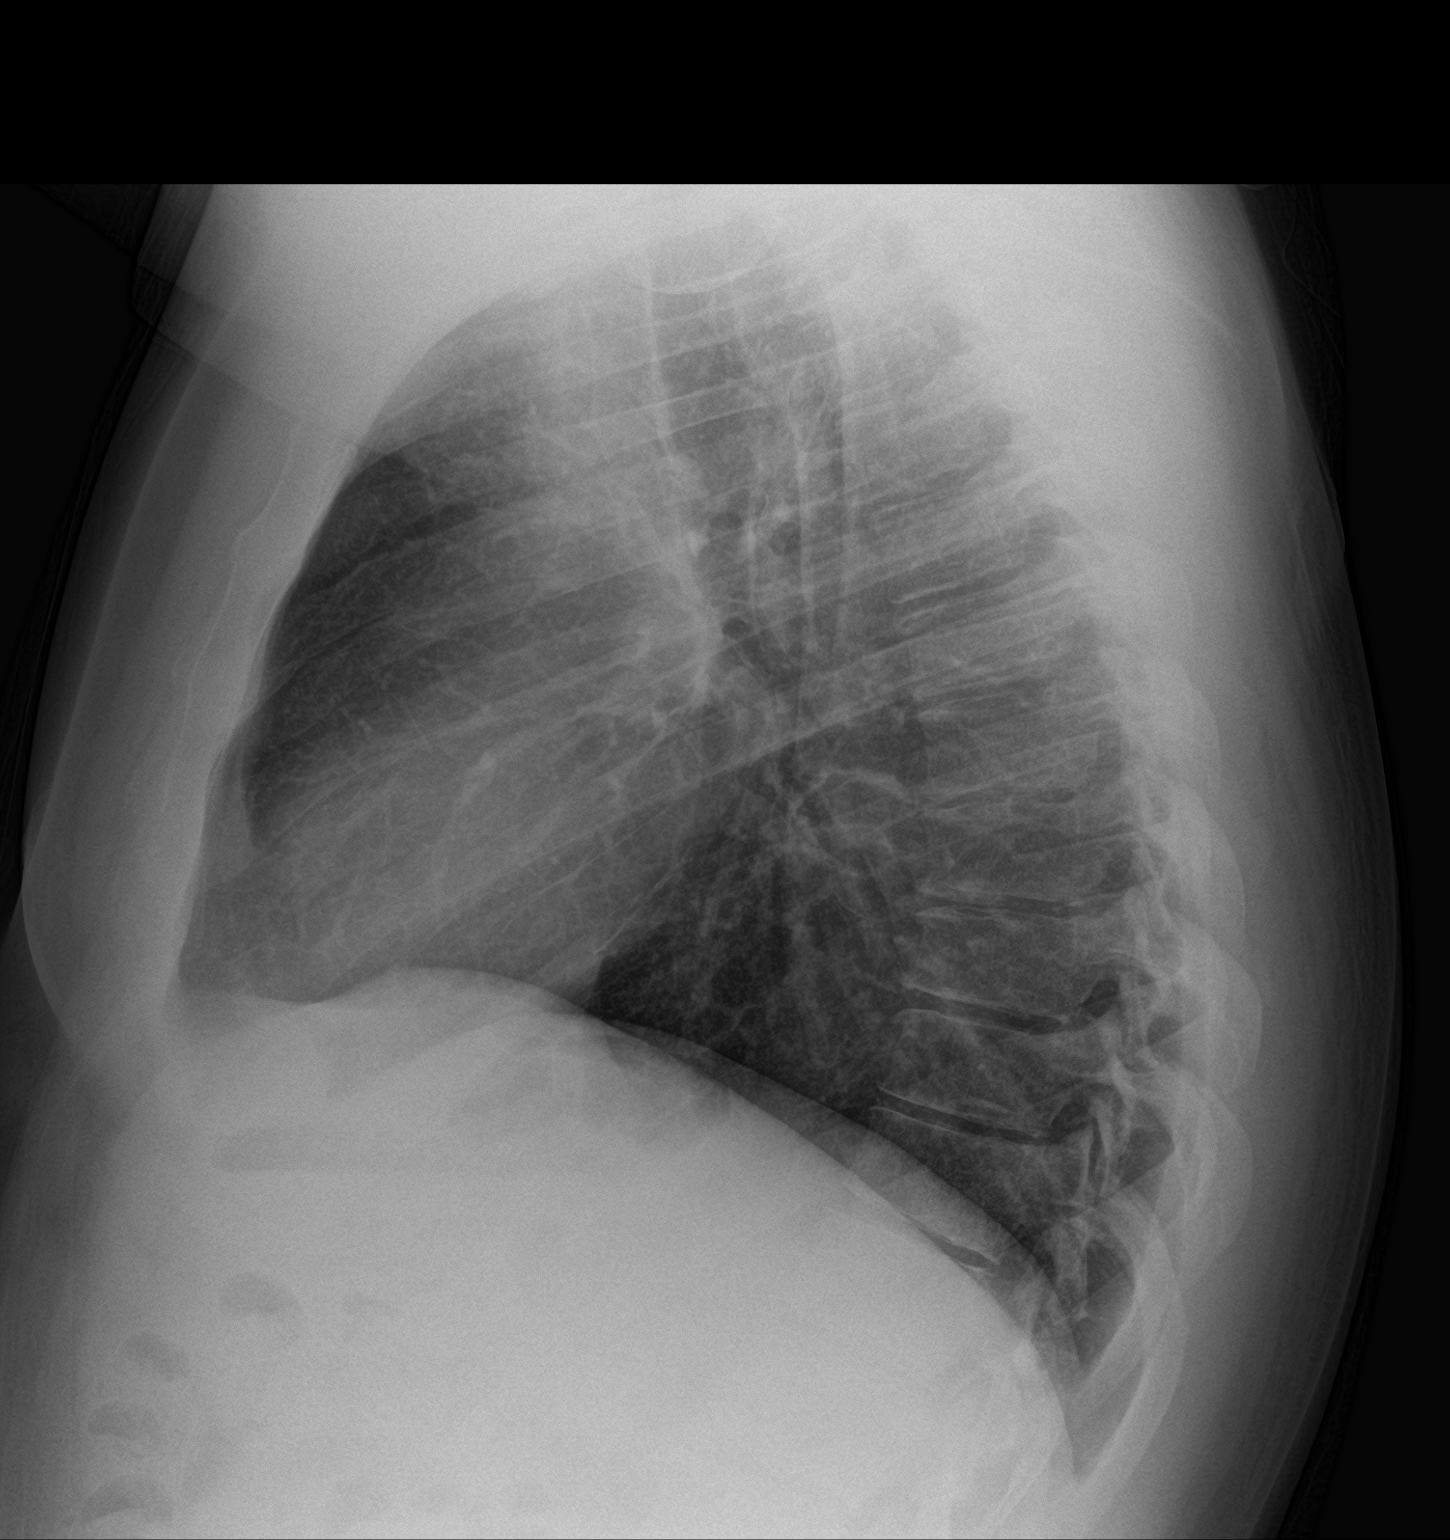

[2 of 2 positions shown; findings below may reference images not displayed]

FINDINGS: The cardiomediastinal silhouette is unremarkable.

There is no evidence of focal airspace disease, pulmonary edema,
suspicious pulmonary nodule/mass, pleural effusion, or pneumothorax.
No acute bony abnormalities are identified.
IMPRESSION: No active cardiopulmonary disease.

## 2018-07-06 ENCOUNTER — Other Ambulatory Visit: Payer: Self-pay | Admitting: Family Medicine

## 2018-07-06 ENCOUNTER — Encounter: Payer: Self-pay | Admitting: Family Medicine

## 2018-07-06 DIAGNOSIS — F900 Attention-deficit hyperactivity disorder, predominantly inattentive type: Secondary | ICD-10-CM

## 2018-07-06 MED ORDER — AMPHETAMINE-DEXTROAMPHET ER 25 MG PO CP24: 25.0000 mg | ORAL_CAPSULE | ORAL | 0 refills | Status: DC

## 2018-07-06 MED ORDER — AMPHETAMINE-DEXTROAMPHETAMINE 10 MG PO TABS: 10.0000 mg | ORAL_TABLET | Freq: Every day | ORAL | 0 refills | Status: DC | PRN

## 2018-07-10 ENCOUNTER — Encounter: Payer: BLUE CROSS/BLUE SHIELD | Admitting: Family Medicine

## 2018-07-20 ENCOUNTER — Encounter: Payer: Self-pay | Admitting: Gastroenterology

## 2018-07-20 ENCOUNTER — Other Ambulatory Visit: Payer: Self-pay

## 2018-07-20 ENCOUNTER — Ambulatory Visit: Payer: BLUE CROSS/BLUE SHIELD | Admitting: Gastroenterology

## 2018-07-20 VITALS — BP 128/91 | HR 86 | Resp 17 | Ht 74.0 in | Wt 263.8 lb

## 2018-07-20 DIAGNOSIS — K640 First degree hemorrhoids: Secondary | ICD-10-CM | POA: Diagnosis not present

## 2018-07-20 DIAGNOSIS — K625 Hemorrhage of anus and rectum: Secondary | ICD-10-CM

## 2018-07-20 MED ORDER — HYDROCORTISONE 2.5 % RE CREA: 1.0000 "application " | TOPICAL_CREAM | Freq: Two times a day (BID) | RECTAL | 1 refills | Status: AC

## 2018-07-20 NOTE — Progress Notes (Signed)
Christian Darby, MD [address]  [place]  [place], [place] [address]  Main: [phone]  Fax: [phone] Pager: [phone]   Consultation  Referring Provider:     Steele Sizer, MD Primary Care Physician:  Steele Sizer, MD Primary Gastroenterologist:  Dr. Sherri Sear         Reason for Consultation:     Rectal bleeding  Date of Admission:  (Not on file) Date of Consultation:  07/20/2018         HPI:   Christian Hamilton is a 32 y.o. Caucasian male with approximately 2 months history of rectal bleeding, on wiping, yellowish foul-smelling discharge per rectum associated with perianal itching and irritation. Dr. Ancil Boozer prescribed Anusol suppository for 2 weeks. Patient's symptoms modestly improved. However, he reports ongoing rectal bleeding. He denies altered bowel habits or change in stool caliber. He cut back on red meat consumption last few weeks. He denies any other GI symptoms.   NSAIDs: none  Antiplts/Anticoagulants/Anti thrombotics: none  GI Procedures: none He denies family history of GI malignancy He did not have any abdominal surgeries in the past  Past Medical History:  Diagnosis Date  . ADHD (attention deficit hyperactivity disorder)   . Anxiety   . Asthma   . Back pain     Past Surgical History:  Procedure Laterality Date  . LEG SURGERY    . SHOULDER SURGERY    . TONSILECTOMY, ADENOIDECTOMY, BILATERAL MYRINGOTOMY AND TUBES    . tubes in ears      Current Outpatient Medications:  .  amphetamine-dextroamphetamine (ADDERALL XR) 25 MG 24 hr capsule, Take 1 capsule by mouth every morning., Disp: 30 capsule, Rfl: 0 .  amphetamine-dextroamphetamine (ADDERALL) 10 MG tablet, Take 1 tablet (10 mg total) by mouth daily as needed (at lunch)., Disp: 30 tablet, Rfl: 0 .  hydrocortisone (ANUSOL-HC) 2.5 % rectal cream, Place 1 application rectally 2 (two) times daily., Disp: 30 g, Rfl: 1 .  hydrocortisone (ANUSOL-HC) 25 MG suppository, Place 1  suppository (25 mg total) rectally 2 (two) times daily. (Patient not taking: Reported on 07/20/2018), Disp: 10 suppository, Rfl: 0   Family History  Problem Relation Age of Onset  . Kidney disease Father   . ADD / ADHD Brother      Social History   Tobacco Use  . Smoking status: Never Smoker  . Smokeless tobacco: Never Used  Substance Use Topics  . Alcohol use: Yes    Alcohol/week: 6.0 standard drinks    Types: 6 Cans of beer per week  . Drug use: No    Allergies as of 07/20/2018  . (No Known Allergies)    Review of Systems:    All systems reviewed and negative except where noted in HPI.   Physical Exam:   Vitals:   07/20/18 1343  BP: (!) 128/91  Pulse: 86  Resp: 17  Body mass index is 33.87 kg/m.    General:   Pleasant, cooperative in NAD Head:  Normocephalic and atraumatic. Eyes:   No icterus.   Conjunctiva pink. PERRLA. Ears:  Normal auditory acuity. Neck:  Supple; no masses or thyroidomegaly Lungs: Respirations even and unlabored. Lungs clear to auscultation bilaterally.   No wheezes, crackles, or rhonchi.  Heart:  Regular rate and rhythm;  Without murmur, clicks, rubs or gallops Abdomen:  Soft, nondistended, nontender. Normal bowel sounds. No appreciable masses or hepatomegaly.  No rebound or guarding.  Rectal:  Not performed. Msk:  Symmetrical without gross deformities.  Strength normal  Extremities:  Without edema, cyanosis or clubbing. Neurologic:  Alert and oriented x3;  grossly normal neurologically. Skin:  Intact without significant lesions or rashes. Cervical Nodes:  No significant cervical adenopathy. Psych:  Alert and cooperative. Normal affect.  LAB RESULTS: CBC Latest Ref Rng & Units 06/02/2018 01/26/2018 03/20/2017  WBC 3.8 - 10.8 Thousand/uL 7.2 8.9 10.0  Hemoglobin 13.2 - 17.1 g/dL 15.6 16.1 15.1  Hematocrit 38.5 - 50.0 % 45.3 46.7 43.7  Platelets 140 - 400 Thousand/uL 174 227 167    BMET BMP Latest Ref Rng & Units 01/26/2018 03/20/2017  07/25/2016  Glucose 65 - 99 mg/dL 92 99 91  BUN 7 - 25 mg/dL _0 Creatinine 0.60 - 1.35 mg/dL 0.81 0.58(L) 0.81  BUN/Creat Ratio 6 - 22 (calc) NOT APPLICABLE - -  Sodium 544 - 146 mmol/L 140 137 140  Potassium 3.5 - 5.3 mmol/L 4.4 3.9 4.1  Chloride 98 - 110 mmol/L 101 101 103  CO2 20 - 32 mmol/L 32 29 27  Calcium 8.6 - 10.3 mg/dL 9.1 9.4 9.3    LFT Hepatic Function Latest Ref Rng & Units 01/26/2018 07/25/2016  Total Protein 6.1 - 8.1 g/dL 6.7 6.5  Albumin 3.6 - 5.1 g/dL - 4.4  AST 10 - 40 U/L 16 24  ALT 9 - 46 U/L 19 26  Alk Phosphatase 40 - 115 U/L - 47  Total Bilirubin 0.2 - 1.2 mg/dL 0.5 0.8     STUDIES: No results found.    Impression / Plan:   Christian Hamilton is a 32 y.o. white male with two-months history of rectal bleeding, symptomatic hemorrhoids. Rectal bleeding consistent despite 2 weeks trial of Anusol suppository  - Recommend colonoscopy for further evaluation - Discussed with him about outpatient hemorrhoid ligation based on the colonoscopy results - continue Anusol cream twice daily  Follow-up after the colonoscopy   Sherri Sear, MD  07/20/2018, 2:32 PM   Note: This dictation was prepared with Dragon dictation along with smaller phrase technology. Any transcriptional errors that result from this process are unintentional.

## 2018-07-23 ENCOUNTER — Encounter: Payer: Self-pay | Admitting: *Deleted

## 2018-07-23 ENCOUNTER — Ambulatory Visit
Admission: RE | Admit: 2018-07-23 | Discharge: 2018-07-23 | Disposition: A | Discharge status: Home / Self Care | Payer: BLUE CROSS/BLUE SHIELD | Source: Ambulatory Visit | Attending: Gastroenterology | Admitting: Gastroenterology

## 2018-07-23 ENCOUNTER — Encounter
Admission: RE | Disposition: A | Discharge status: Home / Self Care | Payer: Self-pay | Source: Ambulatory Visit | Attending: Gastroenterology

## 2018-07-23 ENCOUNTER — Ambulatory Visit: Payer: BLUE CROSS/BLUE SHIELD | Admitting: Certified Registered Nurse Anesthetist

## 2018-07-23 ENCOUNTER — Encounter: Payer: Self-pay | Admitting: Nurse Practitioner

## 2018-07-23 ENCOUNTER — Ambulatory Visit (INDEPENDENT_AMBULATORY_CARE_PROVIDER_SITE_OTHER): Payer: BLUE CROSS/BLUE SHIELD | Admitting: Nurse Practitioner

## 2018-07-23 VITALS — BP 110/80 | HR 96 | Temp 98.1°F | Resp 16 | Ht 74.02 in | Wt 261.2 lb

## 2018-07-23 DIAGNOSIS — Z0000 Encounter for general adult medical examination without abnormal findings: Secondary | ICD-10-CM

## 2018-07-23 DIAGNOSIS — D122 Benign neoplasm of ascending colon: Secondary | ICD-10-CM | POA: Diagnosis not present

## 2018-07-23 DIAGNOSIS — F909 Attention-deficit hyperactivity disorder, unspecified type: Secondary | ICD-10-CM | POA: Diagnosis not present

## 2018-07-23 DIAGNOSIS — K635 Polyp of colon: Secondary | ICD-10-CM | POA: Diagnosis not present

## 2018-07-23 DIAGNOSIS — R030 Elevated blood-pressure reading, without diagnosis of hypertension: Secondary | ICD-10-CM | POA: Diagnosis not present

## 2018-07-23 DIAGNOSIS — K6289 Other specified diseases of anus and rectum: Secondary | ICD-10-CM

## 2018-07-23 DIAGNOSIS — K625 Hemorrhage of anus and rectum: Secondary | ICD-10-CM

## 2018-07-23 HISTORY — PX: COLONOSCOPY WITH PROPOFOL: SHX5780

## 2018-07-23 SURGERY — COLONOSCOPY WITH PROPOFOL
Anesthesia: General

## 2018-07-23 MED ORDER — PROPOFOL 500 MG/50ML IV EMUL
INTRAVENOUS | Status: DC | PRN
  Administered 2018-07-23: 140 ug/kg/min via INTRAVENOUS

## 2018-07-23 MED ORDER — SODIUM CHLORIDE 0.9 % IV SOLN
INTRAVENOUS | Status: DC
  Administered 2018-07-23: 13:00:00 via INTRAVENOUS

## 2018-07-23 MED ORDER — MIDAZOLAM HCL 2 MG/2ML IJ SOLN
INTRAMUSCULAR | Status: AC
  Filled 2018-07-23: qty 2

## 2018-07-23 MED ORDER — PROPOFOL 10 MG/ML IV BOLUS
INTRAVENOUS | Status: DC | PRN
  Administered 2018-07-23: 100 mg via INTRAVENOUS
  Administered 2018-07-23 (×2): 29 mg via INTRAVENOUS

## 2018-07-23 MED ORDER — LIDOCAINE HCL (PF) 2 % IJ SOLN
INTRAMUSCULAR | Status: AC
  Filled 2018-07-23: qty 10

## 2018-07-23 MED ORDER — PROPOFOL 500 MG/50ML IV EMUL
INTRAVENOUS | Status: AC
  Filled 2018-07-23: qty 100

## 2018-07-23 MED ORDER — LIDOCAINE HCL (CARDIAC) PF 100 MG/5ML IV SOSY
PREFILLED_SYRINGE | INTRAVENOUS | Status: DC | PRN
  Administered 2018-07-23: 50 mg via INTRAVENOUS

## 2018-07-23 MED ORDER — MIDAZOLAM HCL 2 MG/2ML IJ SOLN
INTRAMUSCULAR | Status: DC | PRN
  Administered 2018-07-23: 2 mg via INTRAVENOUS

## 2018-07-23 NOTE — Patient Instructions (Addendum)
Goal blood pressure when at rest is under 176 (systolic)/90(Diastolic) DASH Eating Plan DASH stands for "Dietary Approaches to Stop Hypertension." The DASH eating plan is a healthy eating plan that has been shown to reduce high blood pressure (hypertension). It may also reduce your risk for type 2 diabetes, heart disease, and stroke. The DASH eating plan may also help with weight loss. What are tips for following this plan? General guidelines  Avoid eating more than 2,300 mg (milligrams) of salt (sodium) a day. If you have hypertension, you may need to reduce your sodium intake to 1,500 mg a day.  Limit alcohol intake to no more than 1 drink a day for nonpregnant women and 2 drinks a day for men. One drink equals 12 oz of beer, 5 oz of wine, or 1 oz of hard liquor.  Work with your health care provider to maintain a healthy body weight or to lose weight. Ask what an ideal weight is for you.  Get at least 30 minutes of exercise that causes your heart to beat faster (aerobic exercise) most days of the week. Activities may include walking, swimming, or biking.  Work with your health care provider or diet and nutrition specialist (dietitian) to adjust your eating plan to your individual calorie needs. Reading food labels  Check food labels for the amount of sodium per serving. Choose foods with less than 5 percent of the Daily Value of sodium. Generally, foods with less than 300 mg of sodium per serving fit into this eating plan.  To find whole grains, look for the word "whole" as the first word in the ingredient list. Shopping  Buy products labeled as "low-sodium" or "no salt added."  Buy fresh foods. Avoid canned foods and premade or frozen meals. Cooking  Avoid adding salt when cooking. Use salt-free seasonings or herbs instead of table salt or sea salt. Check with your health care provider or pharmacist before using salt substitutes.  Do not fry foods. Cook foods using healthy methods  such as baking, boiling, grilling, and broiling instead.  Cook with heart-healthy oils, such as olive, canola, soybean, or sunflower oil. Meal planning   Eat a balanced diet that includes: ? 5 or more servings of fruits and vegetables each day. At each meal, try to fill half of your plate with fruits and vegetables. ? Up to 6-8 servings of whole grains each day. ? Less than 6 oz of lean meat, poultry, or fish each day. A 3-oz serving of meat is about the same size as a deck of cards. One egg equals 1 oz. ? 2 servings of low-fat dairy each day. ? A serving of nuts, seeds, or beans 5 times each week. ? Heart-healthy fats. Healthy fats called Omega-3 fatty acids are found in foods such as flaxseeds and coldwater fish, like sardines, salmon, and mackerel.  Limit how much you eat of the following: ? Canned or prepackaged foods. ? Food that is high in trans fat, such as fried foods. ? Food that is high in saturated fat, such as fatty meat. ? Sweets, desserts, sugary drinks, and other foods with added sugar. ? Full-fat dairy products.  Do not salt foods before eating.  Try to eat at least 2 vegetarian meals each week.  Eat more home-cooked food and less restaurant, buffet, and fast food.  When eating at a restaurant, ask that your food be prepared with less salt or no salt, if possible. What foods are recommended? The items listed may not  be a complete list. Talk with your dietitian about what dietary choices are best for you. Grains Whole-grain or whole-wheat bread. Whole-grain or whole-wheat pasta. Brown rice. [place]row. Bulgur. Whole-grain and low-sodium cereals. Pita bread. Low-fat, low-sodium crackers. Whole-wheat flour tortillas. Vegetables Fresh or frozen vegetables (raw, steamed, roasted, or grilled). Low-sodium or reduced-sodium tomato and vegetable juice. Low-sodium or reduced-sodium tomato sauce and tomato paste. Low-sodium or reduced-sodium canned vegetables. Fruits All  fresh, dried, or frozen fruit. Canned fruit in natural juice (without added sugar). Meat and other protein foods Skinless chicken or [place]. Ground chicken or [place]. Pork with fat trimmed off. Fish and seafood. Egg whites. Dried beans, peas, or lentils. Unsalted nuts, nut butters, and seeds. Unsalted canned beans. Lean cuts of beef with fat trimmed off. Low-sodium, lean deli meat. Dairy Low-fat (1%) or fat-free (skim) milk. Fat-free, low-fat, or reduced-fat cheeses. Nonfat, low-sodium ricotta or cottage cheese. Low-fat or nonfat yogurt. Low-fat, low-sodium cheese. Fats and oils Soft margarine without trans fats. Vegetable oil. Low-fat, reduced-fat, or light mayonnaise and salad dressings (reduced-sodium). Canola, safflower, olive, soybean, and sunflower oils. Avocado. Seasoning and other foods Herbs. Spices. Seasoning mixes without salt. Unsalted popcorn and pretzels. Fat-free sweets. What foods are not recommended? The items listed may not be a complete list. Talk with your dietitian about what dietary choices are best for you. Grains Baked goods made with fat, such as croissants, muffins, or some breads. Dry pasta or rice meal packs. Vegetables Creamed or fried vegetables. Vegetables in a cheese sauce. Regular canned vegetables (not low-sodium or reduced-sodium). Regular canned tomato sauce and paste (not low-sodium or reduced-sodium). Regular tomato and vegetable juice (not low-sodium or reduced-sodium). Angie Fava. Olives. Fruits Canned fruit in a light or heavy syrup. Fried fruit. Fruit in cream or butter sauce. Meat and other protein foods Fatty cuts of meat. Ribs. Fried meat. Berniece Salines. Sausage. Bologna and other processed lunch meats. Salami. Fatback. Hotdogs. Bratwurst. Salted nuts and seeds. Canned beans with added salt. Canned or smoked fish. Whole eggs or egg yolks. Chicken or [place] with skin. Dairy Whole or 2% milk, cream, and half-and-half. Whole or full-fat cream cheese. Whole-fat or  sweetened yogurt. Full-fat cheese. Nondairy creamers. Whipped toppings. Processed cheese and cheese spreads. Fats and oils Butter. Stick margarine. Lard. Shortening. Ghee. Bacon fat. Tropical oils, such as coconut, palm kernel, or palm oil. Seasoning and other foods Salted popcorn and pretzels. Onion salt, garlic salt, seasoned salt, table salt, and sea salt. Worcestershire sauce. Tartar sauce. Barbecue sauce. Teriyaki sauce. Soy sauce, including reduced-sodium. Steak sauce. Canned and packaged gravies. Fish sauce. Oyster sauce. Cocktail sauce. Horseradish that you find on the shelf. Ketchup. Mustard. Meat flavorings and tenderizers. Bouillon cubes. Hot sauce and Tabasco sauce. Premade or packaged marinades. Premade or packaged taco seasonings. Relishes. Regular salad dressings. Where to find more information:  National Heart, Lung, and [place]: https://wilson-eaton.com/  American Heart Association: www.heart.org Summary  The DASH eating plan is a healthy eating plan that has been shown to reduce high blood pressure (hypertension). It may also reduce your risk for type 2 diabetes, heart disease, and stroke.  With the DASH eating plan, you should limit salt (sodium) intake to 2,300 mg a day. If you have hypertension, you may need to reduce your sodium intake to 1,500 mg a day.  When on the DASH eating plan, aim to eat more fresh fruits and vegetables, whole grains, lean proteins, low-fat dairy, and heart-healthy fats.  Work with your health care provider or diet and nutrition  specialist (dietitian) to adjust your eating plan to your individual calorie needs. This information is not intended to replace advice given to you by your health care provider. Make sure you discuss any questions you have with your health care provider. Document Released: 10/03/2011 Document Revised: 10/07/2016 Document Reviewed: 10/07/2016 Elsevier Interactive Patient Education  [employer].

## 2018-07-23 NOTE — H&P (Signed)
Cephas Darby, MD [address]  [place]  [place], [place] [address]  Main: [phone]  Fax: [phone] Pager: [phone]  Primary Care Physician:  Steele Sizer, MD Primary Gastroenterologist:  Dr. Cephas Darby  Pre-Procedure History & Physical: HPI:  [place] is a 32 y.o. male is here for an colonoscopy.   Past Medical History:  Diagnosis Date  . ADHD (attention deficit hyperactivity disorder)   . Anxiety   . Asthma   . Back pain     Past Surgical History:  Procedure Laterality Date  . LEG SURGERY    . SHOULDER SURGERY    . TONSILECTOMY, ADENOIDECTOMY, BILATERAL MYRINGOTOMY AND TUBES    . tubes in ears      Prior to Admission medications   Medication Sig Start Date End Date Taking? Authorizing Provider  amphetamine-dextroamphetamine (ADDERALL XR) 25 MG 24 hr capsule Take 1 capsule by mouth every morning. 07/06/18  Yes Sowles, Drue Stager, MD  amphetamine-dextroamphetamine (ADDERALL) 10 MG tablet Take 1 tablet (10 mg total) by mouth daily as needed (at lunch). 07/06/18   Steele Sizer, MD  hydrocortisone (ANUSOL-HC) 2.5 % rectal cream Place 1 application rectally 2 (two) times daily. 07/20/18 08/19/18  Lin Landsman, MD  hydrocortisone (ANUSOL-HC) 25 MG suppository Place 1 suppository (25 mg total) rectally 2 (two) times daily. Patient not taking: Reported on 07/20/2018 06/02/18   Steele Sizer, MD    Allergies as of 07/20/2018  . (No Known Allergies)    Family History  Problem Relation Age of Onset  . Kidney disease Father   . ADD / ADHD Brother     Social History   Socioeconomic History  . Marital status: Married    Spouse name: [name]  . Number of children: 3  . Years of education: Not on file  . Highest education level: Not on file  Occupational History  . Occupation: Freight forwarder    Comment: [employer]  Social Needs  . Financial resource strain: Not hard at all  . Food insecurity:    Worry: Never true    Inability: Never  true  . Transportation needs:    Medical: No    Non-medical: No  Tobacco Use  . Smoking status: Never Smoker  . Smokeless tobacco: Never Used  Substance and Sexual Activity  . Alcohol use: Yes    Alcohol/week: 6.0 standard drinks    Types: 6 Cans of beer per week  . Drug use: No  . Sexual activity: Yes  Lifestyle  . Physical activity:    Days per week: 3 days    Minutes per session: 60 min  . Stress: Not at all  Relationships  . Social connections:    Talks on phone: More than three times a week    Gets together: More than three times a week    Attends religious service: More than 4 times per year    Active member of club or organization: Yes    Attends meetings of clubs or organizations: 1 to 4 times per year    Relationship status: Married  . Intimate partner violence:    Fear of current or ex partner: No    Emotionally abused: No    Physically abused: No    Forced sexual activity: No  Other Topics Concern  . Not on file  Social History Narrative   He is married and has 3 children    Review of Systems: See HPI, otherwise negative ROS  Physical Exam: BP  122/83   Pulse 80   Temp (!) 96.3 F (35.7 C) (Tympanic)   Resp 16   Ht 6\' 2"  (1.88 m)   Wt 117.5 kg   SpO2 99%   BMI 33.25 kg/m  General:   Alert,  pleasant and cooperative in NAD Head:  Normocephalic and atraumatic. Neck:  Supple; no masses or thyromegaly. Lungs:  Clear throughout to auscultation.    Heart:  Regular rate and rhythm. Abdomen:  Soft, nontender and nondistended. Normal bowel sounds, without guarding, and without rebound.   Neurologic:  Alert and  oriented x4;  grossly normal neurologically.  Impression/Plan: [name] is here for an colonoscopy to be performed for rectal bleeding  Risks, benefits, limitations, and alternatives regarding  colonoscopy have been reviewed with the patient.  Questions have been answered.  All parties agreeable.   Sherri Sear, MD  07/23/2018, 12:52 PM

## 2018-07-23 NOTE — Transfer of Care (Signed)
Immediate Anesthesia Transfer of Care Note  Patient: Christian Hamilton  Procedure(s) Performed: Procedure(s): COLONOSCOPY WITH PROPOFOL (N/A)  Patient Location: PACU and Endoscopy Unit  Anesthesia Type:General  Level of Consciousness: sedated  Airway & Oxygen Therapy: Patient Spontanous Breathing and Patient connected to nasal cannula oxygen  Post-op Assessment: Report given to RN and Post -op Vital signs reviewed and stable  Post vital signs: Reviewed and stable  Last Vitals:  Vitals:   07/23/18 1245 07/23/18 1506  BP: 122/83 97/65  Pulse: 80 80  Resp: 16 15  Temp: (!) 35.7 C (!) 35.6 C  SpO2: 72% 82%    Complications: No apparent anesthesia complications

## 2018-07-23 NOTE — Op Note (Signed)
[facility] Patient Name: Christian Hamilton Procedure Date: 07/23/2018 2:35 PM MRN: [id number] Account #: [id number] Date of Birth: 07/26/1986 Admit Type: Outpatient Age: 32 Room: [facility] ENDO ROOM 3 Gender: Male Note Status: Finalized Procedure:            Colonoscopy Indications:          Rectal bleeding Providers:            Lin Landsman MD, MD Medicines:            Monitored Anesthesia Care Complications:        No immediate complications. Estimated blood loss: None. Procedure:            Pre-Anesthesia Assessment:                       - Prior to the procedure, a History and Physical was                        performed, and patient medications and allergies were                        reviewed. The patient is competent. The risks and                        benefits of the procedure and the sedation options and                        risks were discussed with the patient. All questions                        were answered and informed consent was obtained.                        Patient identification and proposed procedure were                        verified by the physician, the nurse, the                        anesthesiologist, the anesthetist and the technician in                        the pre-procedure area in the procedure room in the                        endoscopy suite. Mental Status Examination: alert and                        oriented. Airway Examination: normal oropharyngeal                        airway and neck mobility. Respiratory Examination:                        clear to auscultation. CV Examination: normal.                        Prophylactic Antibiotics: The patient does not require  prophylactic antibiotics. Prior Anticoagulants: The                        patient has taken no previous anticoagulant or                        antiplatelet agents. ASA Grade Assessment: II - A   patient with mild systemic disease. After reviewing the                        risks and benefits, the patient was deemed in                        satisfactory condition to undergo the procedure. The                        anesthesia plan was to use monitored anesthesia care                        (MAC). Immediately prior to administration of                        medications, the patient was re-assessed for adequacy                        to receive sedatives. The heart rate, respiratory rate,                        oxygen saturations, blood pressure, adequacy of                        pulmonary ventilation, and response to care were                        monitored throughout the procedure. The physical status                        of the patient was re-assessed after the procedure.                       After obtaining informed consent, the colonoscope was                        passed under direct vision. Throughout the procedure,                        the patient's blood pressure, pulse, and oxygen                        saturations were monitored continuously. The                        Colonoscope was introduced through the anus and                        advanced to the the terminal ileum. The colonoscopy was                        performed without difficulty. The patient tolerated the  procedure well. The quality of the bowel preparation                        was evaluated using the BBPS [facility] Bowel Preparation                        Scale) with scores of: Right Colon = 3, Transverse                        Colon = 3 and Left Colon = 3 (entire mucosa seen well                        with no residual staining, small fragments of stool or                        opaque liquid). The total BBPS score equals 9. Findings:      The perianal and digital rectal examinations were normal. Pertinent       negatives include normal sphincter tone and no palpable  rectal lesions.      The terminal ileum appeared normal.      A 5 mm polyp was found in the ascending colon. The polyp was sessile.       The polyp was removed with a cold snare. Resection and retrieval were       complete.      A diffuse area of mildly erythematous mucosa was found in the distal       rectum. Biopsies were taken with a cold forceps for histology.      Normal mucosa was found in the entire colon. Impression:           - The examined portion of the ileum was normal.                       - One 5 mm polyp in the ascending colon, removed with a                        cold snare. Resected and retrieved.                       - Erythematous mucosa in the distal rectum. Biopsied.                       - Normal mucosa in the entire examined colon. Recommendation:       - Discharge patient to home (with escort).                       - Resume previous diet today.                       - Continue present medications.                       - Await pathology results.                       - Repeat colonoscopy in 5-10 years for surveillance                        based on pathology results.                       -  Return to my office as previously scheduled. Procedure Code(s):    --- Professional ---                       [phone], Colonoscopy, flexible; with removal of tumor(s),                        polyp(s), or other lesion(s) by snare technique                       [id number], 38, Colonoscopy, flexible; with biopsy, single                        or multiple Diagnosis Code(s):    --- Professional ---                       D12.2, Benign neoplasm of ascending colon                       K62.89, Other specified diseases of anus and rectum                       K62.5, Hemorrhage of anus and rectum CPT copyright 2017 American Medical Association. All rights reserved. The codes documented in this report are preliminary and upon coder review may  be revised to meet current compliance  requirements. Dr. Ulyess Mort Lin Landsman MD, MD 07/23/2018 3:04:55 PM This report has been signed electronically. Number of Addenda: 0 Note Initiated On: 07/23/2018 2:35 PM Scope Withdrawal Time: 0 hours 13 minutes 0 seconds  Total Procedure Duration: 0 hours 17 minutes 44 seconds       [facility]

## 2018-07-23 NOTE — Anesthesia Preprocedure Evaluation (Signed)
Anesthesia Evaluation  Patient identified by MRN, date of birth, ID band Patient awake    Reviewed: Allergy & Precautions, NPO status , Patient's Chart, lab work & pertinent test results  History of Anesthesia Complications Negative for: history of anesthetic complications  Airway Mallampati: II       Dental   Pulmonary asthma (As a child) , neg sleep apnea, neg COPD,           Cardiovascular (-) hypertension(-) Past MI and (-) CHF (-) dysrhythmias (-) Valvular Problems/Murmurs     Neuro/Psych neg Seizures Anxiety    GI/Hepatic Neg liver ROS, neg GERD  ,  Endo/Other  neg diabetes  Renal/GU negative Renal ROS     Musculoskeletal   Abdominal   Peds  Hematology   Anesthesia Other Findings   Reproductive/Obstetrics                             Anesthesia Physical Anesthesia Plan  ASA: II  Anesthesia Plan: General   Post-op Pain Management:    Induction:   PONV Risk Score and Plan: 2 and Propofol infusion and TIVA  Airway Management Planned: Nasal Cannula  Additional Equipment:   Intra-op Plan:   Post-operative Plan:   Informed Consent: I have reviewed the patients History and Physical, chart, labs and discussed the procedure including the risks, benefits and alternatives for the proposed anesthesia with the patient or authorized representative who has indicated his/her understanding and acceptance.     Plan Discussed with:   Anesthesia Plan Comments:         Anesthesia Quick Evaluation

## 2018-07-23 NOTE — Progress Notes (Signed)
Name: Christian Hamilton   MRN: [id number]    DOB: 11-16-1985   Date:07/23/2018       Progress Note  Subjective  Chief Complaint  Chief Complaint  Patient presents with  . Annual Exam    HPI  Patient presents for annual CPE .  USPSTF grade A and B recommendations:  Patient has had rectal bleeding and abdominal cramping has colonscopy today.  Diet:  Cooks a lot; avoids processed foods. Has at least a fruit or vegetable with meals. 2-3 meals a day.  Drinks water  Drinks cuban coffee  Exercise: plays with kids; stocking job- 4 hours straight.   Depression:  Depression screen [facility] 2/9 07/23/2018 06/02/2018 02/27/2018 11/26/2017 07/09/2017  Decreased Interest 0 0 0 0 0  Down, Depressed, Hopeless 0 0 0 0 0  PHQ - 2 Score 0 0 0 0 0  Altered sleeping - 0 - - 0  Tired, decreased energy - 1 - - 0  Change in appetite - 0 - - 0  Feeling bad or failure about yourself  - 0 - - 0  Trouble concentrating - 2 - - 0  Moving slowly or fidgety/restless - 0 - - 0  Suicidal thoughts - 0 - - 0  PHQ-9 Score - 3 - - 0  Difficult doing work/chores - Somewhat difficult - - Not difficult at all    Hypertension:  BP Readings from Last 3 Encounters:  07/23/18 110/80  07/20/18 (!) 128/91  06/02/18 128/76    Obesity: Wt Readings from Last 3 Encounters:  07/23/18 261 lb 3.2 oz (118.5 kg)  07/20/18 263 lb 12.8 oz (119.7 kg)  06/02/18 270 lb 6.4 oz (122.7 kg)   BMI Readings from Last 3 Encounters:  07/23/18 33.52 kg/m  07/20/18 33.87 kg/m  06/02/18 34.72 kg/m     Lipids:  Lab Results  Component Value Date   CHOL 161 01/26/2018   CHOL 166 07/25/2016   Lab Results  Component Value Date   HDL 50 01/26/2018   HDL 53 07/25/2016   Lab Results  Component Value Date   LDLCALC 97 01/26/2018   LDLCALC 103 07/25/2016   Lab Results  Component Value Date   TRIG 59 01/26/2018   TRIG 52 07/25/2016   Lab Results  Component Value Date   CHOLHDL 3.2 01/26/2018   CHOLHDL 3.1 07/25/2016   No  results found for: LDLDIRECT Glucose:  Glucose, Bld  Date Value Ref Range Status  01/26/2018 92 65 - 99 mg/dL Final    Comment:    .            Fasting reference interval .   03/20/2017 99 65 - 99 mg/dL Final  07/25/2016 91 65 - 99 mg/dL Final      Office Visit from 07/23/2018 in [facility]  AUDIT-C Score  3      Married; 3 kids  STD testing and prevention (HIV/chl/gon/syphilis): declines Hep C: declines   Skin cancer: not out in the sun a lot but if he is covered.  Colorectal cancer: colonoscopy  IPSS Questionnaire (AUA-7): Over the past month.   1)  How often have you had a sensation of not emptying your bladder completely after you finish urinating?  0 - Not at all  2)  How often have you had to urinate again less than two hours after you finished urinating? 0 - Not at all  3)  How often have you found you stopped and started again several  times when you urinated?  0 - Not at all  4) How difficult have you found it to postpone urination?  0 - Not at all  5) How often have you had a weak urinary stream?  0 - Not at all  6) How often have you had to push or strain to begin urination?  0 - Not at all  7) How many times did you most typically get up to urinate from the time you went to bed until the time you got up in the morning?  0 - None  Total score:  0-7 mildly symptomatic   8-19 moderately symptomatic   20-35 severely symptomatic    Advanced Care Planning: A voluntary discussion about advance care planning including the explanation and discussion of advance directives.  Discussed health care proxy and Living will, and the patient was able to identify a health care proxy as Wife- [name].  Patient does not have a living will at present time. If patient does have living will, I have requested they bring this to the clinic to be scanned in to their chart.  Patient Active Problem List   Diagnosis Date Noted  . DDD (degenerative disc disease), cervical  11/04/2017  . Varicocele 07/25/2016  . Neck pain 10/12/2015  . Panic attack as reaction to stress 06/02/2015  . ADD (attention deficit disorder) 05/30/2015  . Back pain, chronic 05/30/2015  . Chronic venous insufficiency 05/30/2015  . Abnormal antinuclear antibody titer 05/30/2015  . Leg varices 05/30/2015  . Migraine without aura and responsive to treatment 05/10/2009  . 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 Obesity (BMI 30-39.9) 08/08/2008    Past Surgical History:  Procedure Laterality Date  . LEG SURGERY    . SHOULDER SURGERY    . TONSILECTOMY, ADENOIDECTOMY, BILATERAL MYRINGOTOMY AND TUBES    . tubes in ears      Family History  Problem Relation Age of Onset  . Kidney disease Father   . ADD / ADHD Brother     Social History   Socioeconomic History  . Marital status: Married    Spouse name: [name]  . Number of children: 3  . Years of education: Not on file  . Highest education level: Not on file  Occupational History  . Occupation: Freight forwarder    Comment: [employer]  Social Needs  . Financial resource strain: Not hard at all  . Food insecurity:    Worry: Never true    Inability: Never true  . Transportation needs:    Medical: No    Non-medical: No  Tobacco Use  . Smoking status: Never Smoker  . Smokeless tobacco: Never Used  Substance and Sexual Activity  . Alcohol use: Yes    Alcohol/week: 6.0 standard drinks    Types: 6 Cans of beer per week  . Drug use: No  . Sexual activity: Yes  Lifestyle  . Physical activity:    Days per week: 3 days    Minutes per session: 60 min  . Stress: Not at all  Relationships  . Social connections:    Talks on phone: More than three times a week    Gets together: More than three times a week    Attends religious service: More than 4 times per year    Active member of club or organization: Yes    Attends meetings of clubs or organizations: 1 to 4 times per year    Relationship status: Married  .  Intimate partner  violence:    Fear of current or ex partner: No    Emotionally abused: No    Physically abused: No    Forced sexual activity: No  Other Topics Concern  . Not on file  Social History Narrative   He is married and has 3 children     Current Outpatient Medications:  .  amphetamine-dextroamphetamine (ADDERALL XR) 25 MG 24 hr capsule, Take 1 capsule by mouth every morning., Disp: 30 capsule, Rfl: 0 .  amphetamine-dextroamphetamine (ADDERALL) 10 MG tablet, Take 1 tablet (10 mg total) by mouth daily as needed (at lunch)., Disp: 30 tablet, Rfl: 0 .  hydrocortisone (ANUSOL-HC) 2.5 % rectal cream, Place 1 application rectally 2 (two) times daily., Disp: 30 g, Rfl: 1 .  hydrocortisone (ANUSOL-HC) 25 MG suppository, Place 1 suppository (25 mg total) rectally 2 (two) times daily. (Patient not taking: Reported on 07/20/2018), Disp: 10 suppository, Rfl: 0  No Known Allergies   Review of Systems  Constitutional: Negative for chills, fever and malaise/fatigue.  HENT: Negative for congestion, sore throat and tinnitus.   Eyes: Negative for blurred vision and double vision.  Respiratory: Negative for cough and shortness of breath.   Cardiovascular: Negative for chest pain, palpitations and leg swelling.  Gastrointestinal: Positive for abdominal pain (mild abdominal pain after eating) and blood in stool. Negative for constipation, diarrhea, heartburn and nausea.  Genitourinary: Negative for dysuria.  Musculoskeletal: Negative for falls, joint pain and myalgias.  Skin: Negative for rash.  Neurological: Negative for dizziness, tingling, weakness and headaches.  Psychiatric/Behavioral: The patient is not nervous/anxious and does not have insomnia.       Objective  Vitals:   07/23/18 0849 07/23/18 0932  BP: 110/90 110/80  Pulse: 96   Resp: 16   Temp: 98.1 F (36.7 C)   TempSrc: Oral   SpO2: 98%   Weight: 261 lb 3.2 oz (118.5 kg)   Height: 6' 2.02" (1.88 m)     Body mass  index is 33.52 kg/m.  Physical Exam   Constitutional: Patient appears well-developed and well-nourished. No distress.  HENT: Head: Normocephalic and atraumatic. Ears: Bilateral TMs scarring, no erythema or effusion; Nose: Nose normal. Mouth/Throat: Oropharynx is clear and moist. No oropharyngeal exudate.  Eyes: Conjunctivae and EOM are normal. Pupils are equal, round, and reactive to light. No scleral icterus.  Neck: Normal range of motion. Neck supple. No JVD present. No thyromegaly present.  Cardiovascular: Normal rate, regular rhythm and normal heart sounds.  No murmur heard. No BLE edema. Note: one skipped beat noted, did not return after listening for over one minute, discussed with patient likely PVC due to dehydration from bowel prep. Considered EKG.  Pulmonary/Chest: Effort normal and breath sounds normal. No respiratory distress. Abdominal: Soft. Bowel sounds are hypoactive, no distension. There is no tenderness. no masses MALE GENITALIA: deferred, patient preference. Musculoskeletal: Normal range of motion, no joint effusions. No gross deformities Neurological: he is alert and oriented to person, place, and time. No cranial nerve deficit. Coordination, balance, strength, speech and gait are normal.  Skin: Skin is warm and dry. No rash noted. No erythema.  Psychiatric: Patient has a normal mood and affect. behavior is normal. Judgment and thought content normal.  Recent Results (from the past 2160 hour(s))  CBC with Differential/Platelet     Status: None   Collection Time: 06/02/18 11:31 AM  Result Value Ref Range   WBC 7.2 3.8 - 10.8 Thousand/uL   RBC 4.95 4.20 - 5.80 Million/uL   Hemoglobin  15.6 13.2 - 17.1 g/dL   HCT 45.3 38.5 - 50.0 %   MCV 91.5 80.0 - 100.0 fL   MCH 31.5 27.0 - 33.0 pg   MCHC 34.4 32.0 - 36.0 g/dL   RDW 11.9 11.0 - 15.0 %   Platelets 174 140 - 400 Thousand/uL   MPV 11.8 7.5 - 12.5 fL   Neutro Abs 4,406 1,500 - 7,800 cells/uL   Lymphs Abs 1,908 850 -  3,900 cells/uL   WBC mixed population 698 200 - 950 cells/uL   Eosinophils Absolute 130 15 - 500 cells/uL   Basophils Absolute 58 0 - 200 cells/uL   Neutrophils Relative % 61.2 %   Total Lymphocyte 26.5 %   Monocytes Relative 9.7 %   Eosinophils Relative 1.8 %   Basophils Relative 0.8 %  Sedimentation rate     Status: None   Collection Time: 06/02/18 11:31 AM  Result Value Ref Range   Sed Rate 2 0 - 15 mm/h  C-reactive protein     Status: None   Collection Time: 06/02/18 11:31 AM  Result Value Ref Range   CRP 1.4 <8.0 mg/L     PHQ2/9: Depression screen [facility] 2/9 07/23/2018 06/02/2018 02/27/2018 11/26/2017 07/09/2017  Decreased Interest 0 0 0 0 0  Down, Depressed, Hopeless 0 0 0 0 0  PHQ - 2 Score 0 0 0 0 0  Altered sleeping - 0 - - 0  Tired, decreased energy - 1 - - 0  Change in appetite - 0 - - 0  Feeling bad or failure about yourself  - 0 - - 0  Trouble concentrating - 2 - - 0  Moving slowly or fidgety/restless - 0 - - 0  Suicidal thoughts - 0 - - 0  PHQ-9 Score - 3 - - 0  Difficult doing work/chores - Somewhat difficult - - Not difficult at all    Fall Risk: Fall Risk  07/23/2018 06/02/2018 02/27/2018 01/26/2018 12/26/2017  Falls in the past year? No No No No No     Functional Status Survey: Is the patient deaf or have difficulty hearing?: No Does the patient have difficulty seeing, even when wearing glasses/contacts?: No Does the patient have difficulty concentrating, remembering, or making decisions?: No Does the patient have difficulty walking or climbing stairs?: No Does the patient have difficulty dressing or bathing?: No Does the patient have difficulty doing errands alone such as visiting a doctor's office or shopping?: No   Assessment & Plan  1. Annual physical exam -USPSTF grade A and B recommendations reviewed with patient; age-appropriate recommendations, preventive care, screening tests, etc discussed and encouraged; healthy living encouraged; see AVS for patient  education given to patient -Discussed importance of 150 minutes of physical activity weekly, eat two servings of fish weekly, eat one serving of tree nuts ( cashews, pistachios, pecans, almonds.Marland Kitchen) every other day, eat 6 servings of fruit/vegetables daily and drink plenty of water and avoid sweet beverages.   2. Elevated blood pressure reading Improved; discussed dash.

## 2018-07-23 NOTE — Anesthesia Post-op Follow-up Note (Signed)
Anesthesia QCDR form completed.        

## 2018-07-27 ENCOUNTER — Encounter: Payer: Self-pay | Admitting: Gastroenterology

## 2018-07-28 LAB — SURGICAL PATHOLOGY

## 2018-07-28 NOTE — Anesthesia Postprocedure Evaluation (Signed)
Anesthesia Post Note  Patient: Christian Hamilton  Procedure(s) Performed: COLONOSCOPY WITH PROPOFOL (N/A )  Patient location during evaluation: Endoscopy Anesthesia Type: General Level of consciousness: awake and alert Pain management: pain level controlled Vital Signs Assessment: post-procedure vital signs reviewed and stable Respiratory status: spontaneous breathing, nonlabored ventilation and respiratory function stable Cardiovascular status: blood pressure returned to baseline and stable Postop Assessment: no apparent nausea or vomiting Anesthetic complications: no     Last Vitals:  Vitals:   07/23/18 1516 07/23/18 1526  BP: 97/69 103/69  Pulse: 66 60  Resp: 14 13  Temp:    SpO2: 100% 100%    Last Pain:  Vitals:   07/23/18 1526  TempSrc:   PainSc: 0-No pain                 Alphonsus Sias

## 2018-08-06 ENCOUNTER — Other Ambulatory Visit: Payer: Self-pay | Admitting: Family Medicine

## 2018-08-06 DIAGNOSIS — F900 Attention-deficit hyperactivity disorder, predominantly inattentive type: Secondary | ICD-10-CM

## 2018-08-07 ENCOUNTER — Other Ambulatory Visit: Payer: Self-pay | Admitting: Family Medicine

## 2018-08-07 DIAGNOSIS — F900 Attention-deficit hyperactivity disorder, predominantly inattentive type: Secondary | ICD-10-CM

## 2018-08-07 MED ORDER — AMPHETAMINE-DEXTROAMPHETAMINE 10 MG PO TABS: 10.0000 mg | ORAL_TABLET | Freq: Every day | ORAL | 0 refills | Status: DC | PRN

## 2018-08-07 MED ORDER — AMPHETAMINE-DEXTROAMPHET ER 25 MG PO CP24: 25.0000 mg | ORAL_CAPSULE | ORAL | 0 refills | Status: DC

## 2018-08-07 NOTE — Telephone Encounter (Signed)
Refill request for general medication. Adderall to [facility]  Last office visit 06/02/2018   No follow-ups on file.

## 2018-08-25 ENCOUNTER — Ambulatory Visit: Payer: BLUE CROSS/BLUE SHIELD | Admitting: Gastroenterology

## 2018-08-25 ENCOUNTER — Encounter: Payer: Self-pay | Admitting: Gastroenterology

## 2018-08-25 VITALS — BP 114/81 | HR 80 | Resp 17 | Ht 74.02 in | Wt 262.6 lb

## 2018-08-25 DIAGNOSIS — K640 First degree hemorrhoids: Secondary | ICD-10-CM

## 2018-08-25 NOTE — Progress Notes (Signed)
Christian Darby, MD [address]  [place]  [place], [place] [address]  Main: [phone]  Fax: [phone] Pager: [phone]   Consultation  Referring Provider:     Steele Sizer, MD Primary Care Physician:  Steele Sizer, MD Primary Gastroenterologist:  Dr. Sherri Sear         Reason for Consultation:     Rectal bleeding  Date of Consultation:  08/25/2018         HPI:   Christian Hamilton is a 32 y.o. Caucasian male with approximately 2 months history of rectal bleeding, on wiping, yellowish foul-smelling discharge per rectum associated with perianal itching and irritation. Dr. Ancil Boozer prescribed Anusol suppository for 2 weeks. Patient's symptoms modestly improved. However, he reports ongoing rectal bleeding. He denies altered bowel habits or change in stool caliber. He cut back on red meat consumption last few weeks. He denies any other GI symptoms.   Follow-up visit 08/25/2018 He denies having rectal bleeding anymore.  However, continues to have other symptoms related to hemorrhoids like mucus discharge, perianal itching and irritation.  He tried Anusol suppository for 2 weeks which provided temporary relief.  He underwent colonoscopy, a diminutive polyp was removed, pathology showed tubular adenoma.  He is here to discuss about hemorrhoid ligation.  NSAIDs: none  Antiplts/Anticoagulants/Anti thrombotics: none  GI Procedures:  Colonoscopy 07/23/2018 - The examined portion of the ileum was normal. - One 5 mm polyp in the ascending colon, removed with a cold snare. Resected and retrieved. - Erythematous mucosa in the distal rectum. Biopsied. - Normal mucosa in the entire examined colon  DIAGNOSIS:  A. COLON POLYP X1, ASCENDING; COLD SNARE:  -TUBULAR ADENOMA.  - NEGATIVE FOR HIGH-GRADE DYSPLASIA AND MALIGNANCY.   B. RECTUM, RANDOM DISTAL; COLD BIOPSY:  - COLONIC MUCOSA SHOWING REACTIVE LYMPHOID AGGREGATES.  - NO EVIDENCE OF ACTIVE COLITIS OR DYSPLASIA.    He denies family history of GI malignancy He did not have any abdominal surgeries in the past  Past Medical History:  Diagnosis Date  . ADHD (attention deficit hyperactivity disorder)   . Anxiety   . Asthma   . Back pain     Past Surgical History:  Procedure Laterality Date  . COLONOSCOPY WITH PROPOFOL N/A 07/23/2018   Procedure: COLONOSCOPY WITH PROPOFOL;  Surgeon: Lin Landsman, MD;  Location[facility] ENDOSCOPY;  Service: Gastroenterology;  Laterality: N/A;  . LEG SURGERY    . SHOULDER SURGERY    . TONSILECTOMY, ADENOIDECTOMY, BILATERAL MYRINGOTOMY AND TUBES    . tubes in ears      Current Outpatient Medications:  .  amphetamine-dextroamphetamine (ADDERALL XR) 25 MG 24 hr capsule, Take 1 capsule by mouth every morning., Disp: 30 capsule, Rfl: 0 .  amphetamine-dextroamphetamine (ADDERALL) 10 MG tablet, Take 1 tablet (10 mg total) by mouth daily as needed (at lunch)., Disp: 30 tablet, Rfl: 0 .  hydrocortisone (ANUSOL-HC) 25 MG suppository, Place 1 suppository (25 mg total) rectally 2 (two) times daily. (Patient not taking: Reported on 08/25/2018), Disp: 10 suppository, Rfl: 0   Family History  Problem Relation Age of Onset  . Kidney disease Father   . ADD / ADHD Brother      Social History   Tobacco Use  . Smoking status: Never Smoker  . Smokeless tobacco: Never Used  Substance Use Topics  . Alcohol use: Yes    Alcohol/week: 6.0 standard drinks    Types: 6 Cans of beer per week  . Drug use: No  Allergies as of 08/25/2018  . (No Known Allergies)    Review of Systems:    All systems reviewed and negative except where noted in HPI.   Physical Exam:   Vitals:   08/25/18 0930  BP: 114/81  Pulse: 80  Resp: 17  Body mass index is 33.7 kg/m.    General:   Pleasant, cooperative in NAD Head:  Normocephalic and atraumatic. Eyes:   No icterus.   Conjunctiva pink. PERRLA. Ears:  Normal auditory acuity. Neck:  Supple; no masses or thyroidomegaly Lungs:  Respirations even and unlabored. Lungs clear to auscultation bilaterally.   No wheezes, crackles, or rhonchi.  Heart:  Regular rate and rhythm;  Without murmur, clicks, rubs or gallops Abdomen:  Soft, nondistended, nontender. Normal bowel sounds. No appreciable masses or hepatomegaly.  No rebound or guarding.  Rectal: Perianal redness, nontender, no lesions Msk:  Symmetrical without gross deformities.  Strength normal  Extremities:  Without edema, cyanosis or clubbing. Neurologic:  Alert and oriented x3;  grossly normal neurologically. Skin:  Intact without significant lesions or rashes. Cervical Nodes:  No significant cervical adenopathy. Psych:  Alert and cooperative. Normal affect.  LAB RESULTS: CBC Latest Ref Rng & Units 06/02/2018 01/26/2018 03/20/2017  WBC 3.8 - 10.8 Thousand/uL 7.2 8.9 10.0  Hemoglobin 13.2 - 17.1 g/dL 15.6 16.1 15.1  Hematocrit 38.5 - 50.0 % 45.3 46.7 43.7  Platelets 140 - 400 Thousand/uL 174 227 167    BMET BMP Latest Ref Rng & Units 01/26/2018 03/20/2017 07/25/2016  Glucose 65 - 99 mg/dL 92 99 91  BUN 7 - 25 mg/dL _0 Creatinine 0.60 - 1.35 mg/dL 0.81 0.58(L) 0.81  BUN/Creat Ratio 6 - 22 (calc) NOT APPLICABLE - -  Sodium 485 - 146 mmol/L 140 137 140  Potassium 3.5 - 5.3 mmol/L 4.4 3.9 4.1  Chloride 98 - 110 mmol/L 101 101 103  CO2 20 - 32 mmol/L 32 29 27  Calcium 8.6 - 10.3 mg/dL 9.1 9.4 9.3    LFT Hepatic Function Latest Ref Rng & Units 01/26/2018 07/25/2016  Total Protein 6.1 - 8.1 g/dL 6.7 6.5  Albumin 3.6 - 5.1 g/dL - 4.4  AST 10 - 40 U/L 16 24  ALT 9 - 46 U/L 19 26  Alk Phosphatase 40 - 115 U/L - 47  Total Bilirubin 0.2 - 1.2 mg/dL 0.5 0.8     STUDIES: No results found.    Impression / Plan:   Christian Hamilton is a 32 y.o. white male with two-months history of rectal bleeding, symptomatic hemorrhoids. Rectal bleeding consistent despite 2 weeks trial of Anusol suppository Underwent colonoscopy which showed diminutive polyp only  -Patient  interested to undergo hemorrhoid ligation, risks and benefits discussed Consent obtained Perform hemorrhoid ligation today   Follow-up in 2 weeks   Sherri Sear, MD  08/25/2018, 10:31 AM   Note: This dictation was prepared with Dragon dictation along with smaller phrase technology. Any transcriptional errors that result from this process are unintentional.

## 2018-08-25 NOTE — Progress Notes (Signed)

## 2018-09-07 ENCOUNTER — Other Ambulatory Visit: Payer: Self-pay | Admitting: Family Medicine

## 2018-09-07 DIAGNOSIS — F900 Attention-deficit hyperactivity disorder, predominantly inattentive type: Secondary | ICD-10-CM

## 2018-09-07 NOTE — Telephone Encounter (Signed)
It has been 3 months he needs follow up, I may need to see him at lunch

## 2018-09-07 NOTE — Telephone Encounter (Signed)
Refill request for general medication: ADDERRALL XR 25 mg  Last office visit: 07/23/2018  Last physical exam: 07/23/2018  Follow-ups on file. None indicated

## 2018-09-08 NOTE — Telephone Encounter (Signed)
lvm [phone] @ 9:02 asking pt to return my call. Dr Ancil Boozer is willing to see him during her lunch

## 2018-09-11 ENCOUNTER — Encounter: Payer: Self-pay | Admitting: Family Medicine

## 2018-09-11 ENCOUNTER — Ambulatory Visit (INDEPENDENT_AMBULATORY_CARE_PROVIDER_SITE_OTHER): Payer: BLUE CROSS/BLUE SHIELD | Admitting: Family Medicine

## 2018-09-11 ENCOUNTER — Ambulatory Visit: Payer: BLUE CROSS/BLUE SHIELD | Admitting: Gastroenterology

## 2018-09-11 ENCOUNTER — Encounter: Payer: Self-pay | Admitting: Gastroenterology

## 2018-09-11 VITALS — BP 114/72 | HR 86 | Temp 97.9°F | Resp 16 | Ht 74.0 in | Wt 260.3 lb

## 2018-09-11 VITALS — BP 124/87 | HR 82 | Resp 16 | Ht 74.0 in | Wt 261.4 lb

## 2018-09-11 DIAGNOSIS — F900 Attention-deficit hyperactivity disorder, predominantly inattentive type: Secondary | ICD-10-CM | POA: Diagnosis not present

## 2018-09-11 DIAGNOSIS — K649 Unspecified hemorrhoids: Secondary | ICD-10-CM | POA: Diagnosis not present

## 2018-09-11 DIAGNOSIS — K640 First degree hemorrhoids: Secondary | ICD-10-CM | POA: Diagnosis not present

## 2018-09-11 DIAGNOSIS — M62838 Other muscle spasm: Secondary | ICD-10-CM

## 2018-09-11 MED ORDER — AMPHETAMINE-DEXTROAMPHET ER 25 MG PO CP24: 25.0000 mg | ORAL_CAPSULE | ORAL | 0 refills | Status: DC

## 2018-09-11 MED ORDER — CYCLOBENZAPRINE HCL 10 MG PO TABS: 10.0000 mg | ORAL_TABLET | Freq: Every day | ORAL | 0 refills | Status: DC

## 2018-09-11 MED ORDER — AMPHETAMINE-DEXTROAMPHETAMINE 10 MG PO TABS: 10.0000 mg | ORAL_TABLET | Freq: Every day | ORAL | 0 refills | Status: DC | PRN

## 2018-09-11 MED ORDER — AMPHETAMINE-DEXTROAMPHETAMINE 10 MG PO TABS: 10.0000 mg | ORAL_TABLET | Freq: Every day | ORAL | 0 refills | Status: DC

## 2018-09-11 NOTE — Progress Notes (Signed)
Name: Christian Hamilton   MRN: [id number]    DOB: 1986/07/11   Date:09/11/2018       Progress Note  Subjective  Chief Complaint  Chief Complaint  Patient presents with  . Medication Refill  . ADHD  . Neck Pain    Had a shot in his neck and has improved but still having spasms in his neck occasionally  . 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 Joselyn Arrow  and had steroid injection, radiculitis has improved however having neck spasms and has not been able to contact him for rx of muscle relaxer. We will send 30 days today. He states he is also supposed to resume PT   Rectal bleeding: resolved, seen by Dr. Marius Ditch and had hemorrhoid bending today. He states pain is under control right now.   Patient Active Problem List   Diagnosis Date Noted  . DDD (degenerative disc disease), cervical 11/04/2017  . Varicocele 07/25/2016  . Neck pain 10/12/2015  . Panic attack as reaction to stress 06/02/2015  . ADD (attention deficit disorder) 05/30/2015  . Back pain, chronic 05/30/2015  . Chronic venous insufficiency 05/30/2015  . Abnormal antinuclear antibody titer 05/30/2015  . Migraine without aura and responsive to treatment 05/10/2009  . 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 Obesity (BMI 30-39.9) 08/08/2008    Past Surgical History:  Procedure Laterality Date  . COLONOSCOPY WITH PROPOFOL N/A 07/23/2018   Procedure: COLONOSCOPY WITH PROPOFOL;  Surgeon: Lin Landsman, MD;  Location: [facility] ENDOSCOPY;  Service: Gastroenterology;  Laterality: N/A;  . LEG SURGERY    . SHOULDER SURGERY    . TONSILECTOMY, ADENOIDECTOMY, BILATERAL MYRINGOTOMY AND TUBES    .  tubes in ears      Family History  Problem Relation Age of Onset  . Kidney disease Father   . ADD / ADHD Brother     Social History   Socioeconomic History  . Marital status: Married    Spouse name: [name]  . Number of children: 3  . Years of education: Not on file  . Highest education level: Not on file  Occupational History  . Occupation: Freight forwarder    Comment: [employer]  Social Needs  . Financial resource strain: Not hard at all  . Food insecurity:    Worry: Never true    Inability: Never true  . Transportation needs:    Medical: No    Non-medical: No  Tobacco Use  . Smoking status: Never Smoker  . Smokeless tobacco: Never Used  Substance and Sexual Activity  . Alcohol use: Yes    Alcohol/week: 6.0 standard drinks    Types: 6 Cans of beer per week  . Drug use: No  . Sexual activity: Yes  Lifestyle  . Physical activity:    Days per week: 3 days    Minutes per session: 60 min  . Stress: Not at all  Relationships  . Social connections:    Talks on phone: More than three times a week    Gets together: More than three times a week  Attends religious service: More than 4 times per year    Active member of club or organization: Yes    Attends meetings of clubs or organizations: 1 to 4 times per year    Relationship status: Married  . Intimate partner violence:    Fear of current or ex partner: No    Emotionally abused: No    Physically abused: No    Forced sexual activity: No  Other Topics Concern  . Not on file  Social History Narrative   He is married and has 3 children     Current Outpatient Medications:  .  amphetamine-dextroamphetamine (ADDERALL XR) 25 MG 24 hr capsule, Take 1 capsule by mouth every morning., Disp: 30 capsule, Rfl: 0 .  amphetamine-dextroamphetamine (ADDERALL) 10 MG tablet, Take 1 tablet (10 mg total) by mouth daily as needed (at lunch)., Disp: 30 tablet, Rfl: 0 .  amphetamine-dextroamphetamine (ADDERALL XR) 25 MG 24 hr  capsule, Take 1 capsule by mouth every morning., Disp: 30 capsule, Rfl: 0 .  amphetamine-dextroamphetamine (ADDERALL XR) 25 MG 24 hr capsule, Take 1 capsule by mouth every morning., Disp: 30 capsule, Rfl: 0 .  amphetamine-dextroamphetamine (ADDERALL) 10 MG tablet, Take 1 tablet (10 mg total) by mouth daily with breakfast., Disp: 30 tablet, Rfl: 0 .  amphetamine-dextroamphetamine (ADDERALL) 10 MG tablet, Take 1 tablet (10 mg total) by mouth daily with breakfast., Disp: 30 tablet, Rfl: 0  No Known Allergies  I personally reviewed active problem list, medication list, allergies, family history, social history with the patient/caregiver today.   ROS  Ten systems reviewed and is negative except as mentioned in HPI   Objective  Vitals:   09/11/18 1308  BP: 114/72  Pulse: 86  Resp: 16  Temp: 97.9 F (36.6 C)  TempSrc: Oral  SpO2: 99%  Weight: 260 lb 4.8 oz (118.1 kg)  Height: 6\' 2"  (1.88 m)    Body mass index is 33.42 kg/m.  Physical Exam  Constitutional: Patient appears well-developed and well-nourished. Obese  No distress.  HEENT: head atraumatic, normocephalic, pupils equal and reactive to light, neck supple, throat within normal limits Cardiovascular: Normal rate, regular rhythm and normal heart sounds.  No murmur heard. No BLE edema. Pulmonary/Chest: Effort normal and breath sounds normal. No respiratory distress. Abdominal: Soft.  There is no tenderness. Psychiatric: Patient has a normal mood and affect. behavior is normal. Judgment and thought content normal.  Recent Results (from the past 2160 hour(s))  Surgical pathology     Status: None   Collection Time: 07/23/18  2:51 PM  Result Value Ref Range   SURGICAL PATHOLOGY      Surgical Pathology CASE: [id number] PATIENT: Daemon Taft Surgical Pathology Report     SPECIMEN SUBMITTED: A. Colon polyp x1, ascending; cold snare B. Rectum, random distal; cbx  CLINICAL HISTORY: None provided  PRE-OPERATIVE  DIAGNOSIS: Rectal bleeding K62.5  POST-OPERATIVE DIAGNOSIS: Colon polyp     DIAGNOSIS: A.  COLON POLYP X1, ASCENDING; COLD SNARE: -TUBULAR ADENOMA. - NEGATIVE FOR HIGH-GRADE DYSPLASIA AND MALIGNANCY.  B.  RECTUM, RANDOM DISTAL; COLD BIOPSY: - COLONIC MUCOSA SHOWING REACTIVE LYMPHOID AGGREGATES. - NO EVIDENCE OF ACTIVE COLITIS OR DYSPLASIA.  GROSS DESCRIPTION: A. Labeled: Ascending colon polyp cold snare x1 Received: In formalin Tissue fragment(s): Multiple Size: Aggregate, 0.4 x 0.2 x 0.1 cm Description: Tan tissue fragment with a small amount of tan to brown fecal material Entirely submitted in one cassette.  B. Labeled: Random distal rectum cbx Received: In formalin Tissue fragment(s): 3 Size: 0.1-0.4  cm Descriptio n: Tan-brown fragments Entirely submitted in one cassette.    Final Diagnosis performed by Galvin Proffer, MD.   Electronically signed 07/27/2018 9:47:18AM The electronic signature indicates that the named Attending Pathologist has evaluated the specimen  Technical component performed at [facility], [address] [place], [place] [address] Lab: [phone] Dir: Rush Farmer, MD, MMM  Professional component performed at [facility], [facility], [place], [place], [place] [address] Lab: [phone] Dir: Dellia Nims. Rubinas, MD      PHQ2/9: Depression screen [facility] 2/9 07/23/2018 06/02/2018 02/27/2018 11/26/2017 07/09/2017  Decreased Interest 0 0 0 0 0  Down, Depressed, Hopeless 0 0 0 0 0  PHQ - 2 Score 0 0 0 0 0  Altered sleeping - 0 - - 0  Tired, decreased energy - 1 - - 0  Change in appetite - 0 - - 0  Feeling bad or failure about yourself  - 0 - - 0  Trouble concentrating - 2 - - 0  Moving slowly or fidgety/restless - 0 - - 0  Suicidal thoughts - 0 - - 0  PHQ-9 Score - 3 - - 0  Difficult doing work/chores - Somewhat difficult - - Not difficult at all    Fall Risk: Fall Risk  09/11/2018 07/23/2018 06/02/2018 02/27/2018 01/26/2018  Falls  in the past year? 0 No No No No  Number falls in past yr: 0 - - - -  Injury with Fall? 0 - - - -    Functional Status Survey: Is the patient deaf or have difficulty hearing?: No Does the patient have difficulty seeing, even when wearing glasses/contacts?: Yes(contacts) Does the patient have difficulty concentrating, remembering, or making decisions?: No Does the patient have difficulty walking or climbing stairs?: No Does the patient have difficulty dressing or bathing?: No Does the patient have difficulty doing errands alone such as visiting a doctor's office or shopping?: No   Assessment & Plan  1. Attention deficit hyperactivity disorder (ADHD), predominantly inattentive type  - amphetamine-dextroamphetamine (ADDERALL XR) 25 MG 24 hr capsule; Take 1 capsule by mouth every morning.  Dispense: 30 capsule; Refill: 0  - amphetamine-dextroamphetamine (ADDERALL XR) 25 MG 24 hr capsule; Take 1 capsule by mouth every morning.  Dispense: 30 capsule; Refill: 0 - amphetamine-dextroamphetamine (ADDERALL) 10 MG tablet; Take 1 tablet (10 mg total) by mouth daily as needed (at lunch).  Dispense: 30 tablet; Refill: 0 - amphetamine-dextroamphetamine (ADDERALL XR) 25 MG 24 hr capsule; Take 1 capsule by mouth every morning.  Dispense: 30 capsule; Refill: 0 - amphetamine-dextroamphetamine (ADDERALL XR) 25 MG 24 hr capsule; Take 1 capsule by mouth every morning.  Dispense: 30 capsule; Refill: 0 - amphetamine-dextroamphetamine (ADDERALL) 10 MG tablet; Take 1 tablet (10 mg total) by mouth daily with breakfast.  Dispense: 30 tablet; Refill: 0 - amphetamine-dextroamphetamine (ADDERALL) 10 MG tablet; Take 1 tablet (10 mg total) by mouth daily with breakfast.  Dispense: 30 tablet; Refill: 0  2. Neck muscle spasm  - cyclobenzaprine (FLEXERIL) 10 MG tablet; Take 1 tablet (10 mg total) by mouth at bedtime.  Dispense: 30 tablet; Refill: 0

## 2018-09-11 NOTE — Progress Notes (Signed)

## 2018-10-01 ENCOUNTER — Ambulatory Visit: Payer: BLUE CROSS/BLUE SHIELD | Admitting: Gastroenterology

## 2018-10-01 ENCOUNTER — Encounter: Payer: Self-pay | Admitting: Gastroenterology

## 2018-10-01 VITALS — BP 129/84 | HR 96 | Resp 17 | Ht 74.0 in | Wt 259.6 lb

## 2018-10-01 DIAGNOSIS — K640 First degree hemorrhoids: Secondary | ICD-10-CM | POA: Diagnosis not present

## 2018-10-01 NOTE — Progress Notes (Signed)

## 2018-11-23 ENCOUNTER — Encounter: Payer: Self-pay | Admitting: Physician Assistant

## 2018-11-23 ENCOUNTER — Telehealth: Payer: BLUE CROSS/BLUE SHIELD | Admitting: Physician Assistant

## 2018-11-23 DIAGNOSIS — J111 Influenza due to unidentified influenza virus with other respiratory manifestations: Secondary | ICD-10-CM | POA: Diagnosis not present

## 2018-11-23 MED ORDER — OSELTAMIVIR PHOSPHATE 75 MG PO CAPS: 75.0000 mg | ORAL_CAPSULE | Freq: Two times a day (BID) | ORAL | 0 refills | Status: DC

## 2018-11-23 NOTE — Progress Notes (Addendum)
E visit for Flu like symptoms   We are sorry that you are not feeling well.  Here is how we plan to help! Based on what you have shared with me it looks like you may have a respiratory virus that may be influenza. You symptoms of fever, chills, headache, body ache, sore throat that started abruptly can indicate the flu.  Influenza or "the flu" is   an infection caused by a respiratory virus. The flu virus is highly contagious and persons who did not receive their yearly flu vaccination may "catch" the flu from close contact.   We have anti-viral medications to treat the viruses that cause this infection. They are not a "cure" and only shorten the course of the infection. These prescriptions are most effective when they are given within the first 2 days of "flu" symptoms. Antiviral medication are indicated if you have a high risk of complications from the flu. You should  also consider an antiviral medication if you are in close contact with someone who is at risk. These medications can help patients avoid complications from the flu  but have side effects that you should know. Possible side effects from Tamiflu or oseltamivir include nausea, vomiting, diarrhea, dizziness, headaches, eye redness, sleep problems or other respiratory symptoms. You should not take Tamiflu if you have an allergy to oseltamivir or any to the ingredients in Tamiflu.  Based upon your symptoms and potential risk factors I have prescribed Oseltamivir (Tamiflu).  It has been sent to your designated pharmacy.  You will take one 75 mg capsule orally twice a day for the next 5 days.  Take Ibuprofen as needed for body ache and headache.   ANYONE WHO HAS FLU SYMPTOMS SHOULD: . Stay home. The flu is highly contagious and going out or to work exposes others! . Be sure to drink plenty of fluids. Water is fine as well as fruit juices, sodas and electrolyte beverages. You may want to stay away from caffeine or alcohol. If you are  nauseated, try taking small sips of liquids. How do you know if you are getting enough fluid? Your urine should be a pale yellow or almost colorless. . Get rest. . Taking a steamy shower or using a humidifier may help nasal congestion and ease sore throat pain. Using a saline nasal spray works much the same way. . Cough drops, hard candies and sore throat lozenges may ease your cough. . Line up a caregiver. Have someone check on you regularly.   GET HELP RIGHT AWAY IF: . You cannot keep down liquids or your medications. . You become short of breath . Your fell like you are going to pass out or loose consciousness. . Your symptoms persist after you have completed your treatment plan MAKE SURE YOU   Understand these instructions.  Will watch your condition.  Will get help right away if you are not doing well or get worse.  Your e-visit answers were reviewed by a board certified advanced clinical practitioner to complete your personal care plan.  Depending on the condition, your plan could have included both over the counter or prescription medications.  If there is a problem please reply  once you have received a response from your provider.  Your safety is important to [place].  If you have drug allergies check your prescription carefully.    You can use MyChart to ask questions about today's visit, request a non-urgent call back, or ask for a work or school excuse  for 24 hours related to this e-Visit. If it has been greater than 24 hours you will need to follow up with your provider, or enter a new e-Visit to address those concerns.  You will get an e-mail in the next two days asking about your experience.  I hope that your e-visit has been valuable and will speed your recovery. Thank you for using e-visits.  I have spent 7 minutes in review of this chart- L[facility]

## 2018-12-03 ENCOUNTER — Ambulatory Visit: Payer: BLUE CROSS/BLUE SHIELD | Admitting: Family Medicine

## 2019-01-14 ENCOUNTER — Other Ambulatory Visit: Payer: Self-pay | Admitting: Family Medicine

## 2019-01-14 DIAGNOSIS — F900 Attention-deficit hyperactivity disorder, predominantly inattentive type: Secondary | ICD-10-CM

## 2019-01-14 NOTE — Telephone Encounter (Signed)
Can he be seen tomorrow? I can see him at lunch. Controlled medication

## 2019-01-15 ENCOUNTER — Encounter: Payer: Self-pay | Admitting: Family Medicine

## 2019-01-15 ENCOUNTER — Other Ambulatory Visit: Payer: Self-pay

## 2019-01-15 ENCOUNTER — Ambulatory Visit: Payer: BLUE CROSS/BLUE SHIELD | Admitting: Family Medicine

## 2019-01-15 VITALS — BP 126/84 | HR 97 | Temp 98.2°F | Resp 16 | Ht 74.0 in | Wt 259.9 lb

## 2019-01-15 DIAGNOSIS — E669 Obesity, unspecified: Secondary | ICD-10-CM | POA: Diagnosis not present

## 2019-01-15 DIAGNOSIS — F900 Attention-deficit hyperactivity disorder, predominantly inattentive type: Secondary | ICD-10-CM

## 2019-01-15 MED ORDER — AMPHETAMINE-DEXTROAMPHET ER 25 MG PO CP24: 25.0000 mg | ORAL_CAPSULE | ORAL | 0 refills | Status: DC

## 2019-01-15 MED ORDER — AMPHETAMINE-DEXTROAMPHETAMINE 10 MG PO TABS: 10.0000 mg | ORAL_TABLET | Freq: Every day | ORAL | 0 refills | Status: DC

## 2019-01-15 MED ORDER — AMPHETAMINE-DEXTROAMPHETAMINE 10 MG PO TABS: 10.0000 mg | ORAL_TABLET | Freq: Every day | ORAL | 0 refills | Status: DC | PRN

## 2019-01-15 NOTE — Telephone Encounter (Signed)
Left voice message asking pt to return call to schedule appt

## 2019-01-15 NOTE — Progress Notes (Signed)
Name: Christian Hamilton   MRN: [id number]    DOB: [birth year]   Date:01/15/2019       Progress Note  Subjective  Chief Complaint  Chief Complaint  Patient presents with  . Medication Refill  . 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 BP was slightly elevated when he arrived but improved before he left we will continue to monitor   Obesity: he has been trying Keto diet. Weight is stable.   Patient Active Problem List   Diagnosis Date Noted  . DDD (degenerative disc disease), cervical 11/04/2017  . Varicocele 07/25/2016  . Neck pain 10/12/2015  . Panic attack as reaction to stress 06/02/2015  . ADD (attention deficit disorder) 05/30/2015  . Back pain, chronic 05/30/2015  . Chronic venous insufficiency 05/30/2015  . Abnormal antinuclear antibody titer 05/30/2015  . Migraine without aura and responsive to treatment 05/10/2009  . 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 Obesity (BMI 30-39.9) 08/08/2008    Past Surgical History:  Procedure Laterality Date  . COLONOSCOPY WITH PROPOFOL N/A 07/23/2018   Procedure: COLONOSCOPY WITH PROPOFOL;  Surgeon: Lin Landsman, MD;  Location: [facility] ENDOSCOPY;  Service: Gastroenterology;  Laterality: N/A;  . LEG SURGERY    . SHOULDER SURGERY    . TONSILECTOMY, ADENOIDECTOMY, BILATERAL MYRINGOTOMY AND TUBES    . tubes in ears      Family History  Problem Relation Age of Onset  . Kidney disease Father   . ADD / ADHD Brother     Social History   Socioeconomic History  . Marital status: Married    Spouse name: [name]  . Number of children: 3  . Years of education: Not on file  . Highest education level: Not on file  Occupational History   . Occupation: Freight forwarder    Comment: [employer]  Social Needs  . Financial resource strain: Not hard at all  . Food insecurity:    Worry: Never true    Inability: Never true  . Transportation needs:    Medical: No    Non-medical: No  Tobacco Use  . Smoking status: Never Smoker  . Smokeless tobacco: Never Used  Substance and Sexual Activity  . Alcohol use: Yes    Alcohol/week: 6.0 standard drinks    Types: 6 Cans of beer per week  . Drug use: No  . Sexual activity: Yes  Lifestyle  . Physical activity:    Days per week: 3 days    Minutes per session: 60 min  . Stress: Not at all  Relationships  . Social connections:    Talks on phone: More than three times a week    Gets together: More than three times a week    Attends religious service: More than 4 times per year    Active member of club or organization: Yes    Attends meetings of clubs or organizations: 1 to 4 times per year    Relationship status: Married  . Intimate partner violence:    Fear of current or ex partner: No    Emotionally abused: No    Physically abused:  No    Forced sexual activity: No  Other Topics Concern  . Not on file  Social History Narrative   He is married and has 3 children     Current Outpatient Medications:  .  amphetamine-dextroamphetamine (ADDERALL XR) 25 MG 24 hr capsule, Take 1 capsule by mouth every morning., Disp: 30 capsule, Rfl: 0 .  amphetamine-dextroamphetamine (ADDERALL XR) 25 MG 24 hr capsule, Take 1 capsule by mouth every morning., Disp: 30 capsule, Rfl: 0 .  amphetamine-dextroamphetamine (ADDERALL XR) 25 MG 24 hr capsule, Take 1 capsule by mouth every morning., Disp: 30 capsule, Rfl: 0 .  amphetamine-dextroamphetamine (ADDERALL) 10 MG tablet, Take 1 tablet (10 mg total) by mouth daily with breakfast., Disp: 30 tablet, Rfl: 0 .  amphetamine-dextroamphetamine (ADDERALL) 10 MG tablet, Take 1 tablet (10 mg total) by mouth daily with breakfast., Disp: 30 tablet, Rfl: 0 .   amphetamine-dextroamphetamine (ADDERALL) 10 MG tablet, Take 1 tablet (10 mg total) by mouth daily as needed (at lunch)., Disp: 30 tablet, Rfl: 0 .  cyclobenzaprine (FLEXERIL) 10 MG tablet, Take 1 tablet (10 mg total) by mouth at bedtime., Disp: 30 tablet, Rfl: 0  No Known Allergies  I personally reviewed active problem list, medication list, allergies, family history, social history with the patient/caregiver today.   ROS  Constitutional: Negative for fever or weight change.  Respiratory: Negative for cough and shortness of breath.   Cardiovascular: Negative for chest pain or palpitations.  Gastrointestinal: Negative for abdominal pain, no bowel changes.  Musculoskeletal: Negative for gait problem or joint swelling.  Skin: Negative for rash.  Neurological: Negative for dizziness or headache.  No other specific complaints in a complete review of systems (except as listed in HPI above).   Objective  Vitals:   01/15/19 1202 01/15/19 1239  BP: (!) 126/96 126/84  Pulse: 97   Resp: 16   Temp: 98.2 F (36.8 C)   TempSrc: Oral   SpO2: 98%   Weight: 259 lb 14.4 oz (117.9 kg)   Height: 6\' 2"  (1.88 m)     Body mass index is 33.37 kg/m.  Physical Exam  Constitutional: Patient appears well-developed and well-nourished. Obese  No distress.  HEENT: head atraumatic, normocephalic, pupils equal and reactive to light, neck supple, throat within normal limits Cardiovascular: Normal rate, regular rhythm and normal heart sounds.  No murmur heard. No BLE edema. Pulmonary/Chest: Effort normal and breath sounds normal. No respiratory distress. Abdominal: Soft.  There is no tenderness. Psychiatric: Patient has a normal mood and affect. behavior is normal. Judgment and thought content normal.  PHQ2/9: Depression screen [facility] 2/9 01/15/2019 07/23/2018 06/02/2018 02/27/2018 11/26/2017  Decreased Interest 0 0 0 0 0  Down, Depressed, Hopeless 0 0 0 0 0  PHQ - 2 Score 0 0 0 0 0  Altered sleeping 0 - 0 -  -  Tired, decreased energy 0 - 1 - -  Change in appetite 0 - 0 - -  Feeling bad or failure about yourself  0 - 0 - -  Trouble concentrating 0 - 2 - -  Moving slowly or fidgety/restless 0 - 0 - -  Suicidal thoughts 0 - 0 - -  PHQ-9 Score 0 - 3 - -  Difficult doing work/chores - - Somewhat difficult - -     Fall Risk: Fall Risk  01/15/2019 09/11/2018 07/23/2018 06/02/2018 02/27/2018  Falls in the past year? 0 0 No No No  Number falls in past yr: 0 0 - - -  Injury  with Fall? 0 0 - - -      Assessment & Plan   1. Attention deficit hyperactivity disorder (ADHD), predominantly inattentive type  - amphetamine-dextroamphetamine (ADDERALL XR) 25 MG 24 hr capsule; Take 1 capsule by mouth every morning.  Dispense: 30 capsule; Refill: 0 - amphetamine-dextroamphetamine (ADDERALL XR) 25 MG 24 hr capsule; Take 1 capsule by mouth every morning.  Dispense: 30 capsule; Refill: 0 - amphetamine-dextroamphetamine (ADDERALL XR) 25 MG 24 hr capsule; Take 1 capsule by mouth every morning.  Dispense: 30 capsule; Refill: 0 - amphetamine-dextroamphetamine (ADDERALL) 10 MG tablet; Take 1 tablet (10 mg total) by mouth daily with breakfast.  Dispense: 30 tablet; Refill: 0 - amphetamine-dextroamphetamine (ADDERALL) 10 MG tablet; Take 1 tablet (10 mg total) by mouth daily with breakfast.  Dispense: 30 tablet; Refill: 0 - amphetamine-dextroamphetamine (ADDERALL) 10 MG tablet; Take 1 tablet (10 mg total) by mouth daily as needed (at lunch).  Dispense: 30 tablet; Refill: 0

## 2019-01-18 ENCOUNTER — Ambulatory Visit: Payer: BLUE CROSS/BLUE SHIELD | Admitting: Family Medicine

## 2019-03-19 IMAGING — US US EXTREM LOW VENOUS*L*
1 series · 13 of 24 positions shown · non-contrast
Comparison: None

CLINICAL DATA: LEFT calf pain with swelling since yesterday,
shortness of breath



[Series 1: us extrem low venous*left* · 0.08mm/px · 13 of 37 slices shown]
[im 1/37]
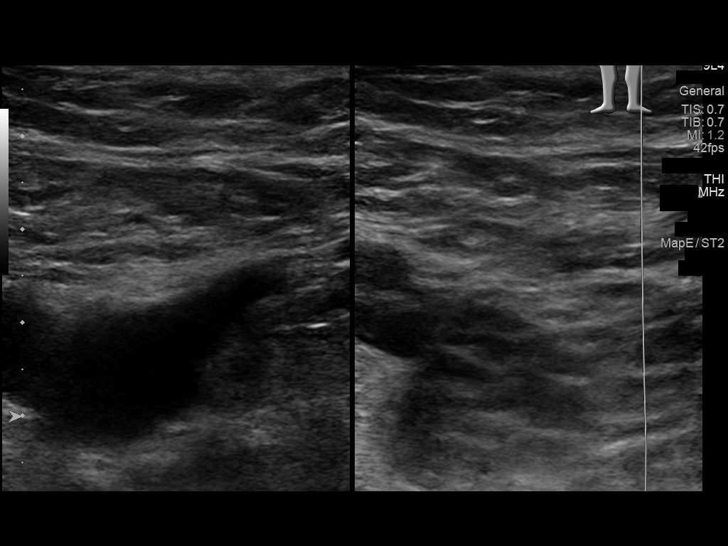
[im 4/37]
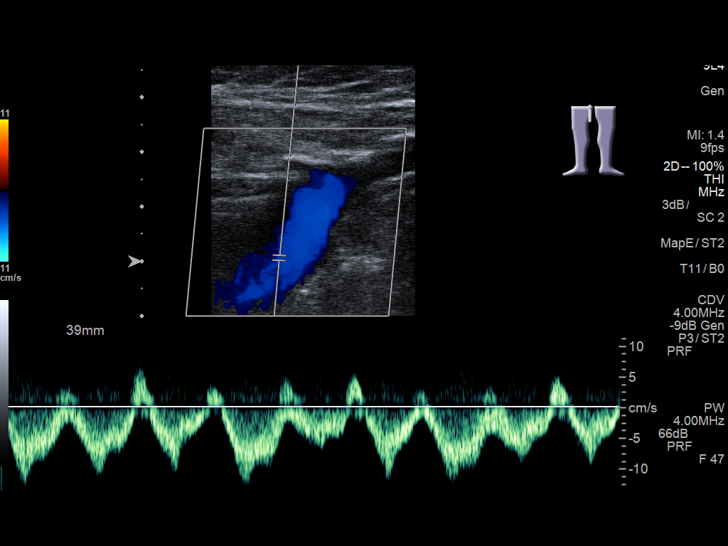
[im 7/37]
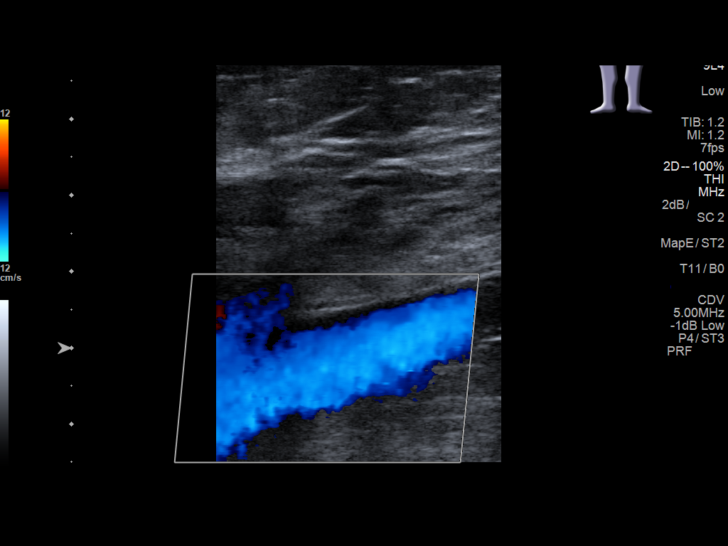
[im 10/37]
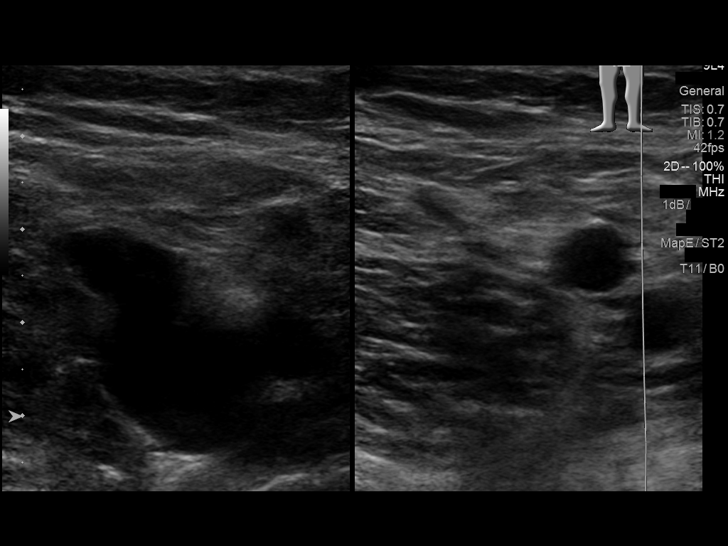
[im 13/37]
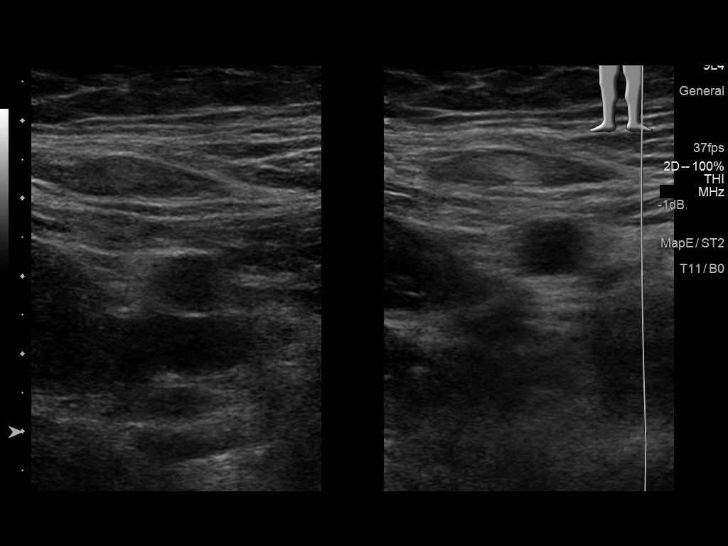
[im 16/37]
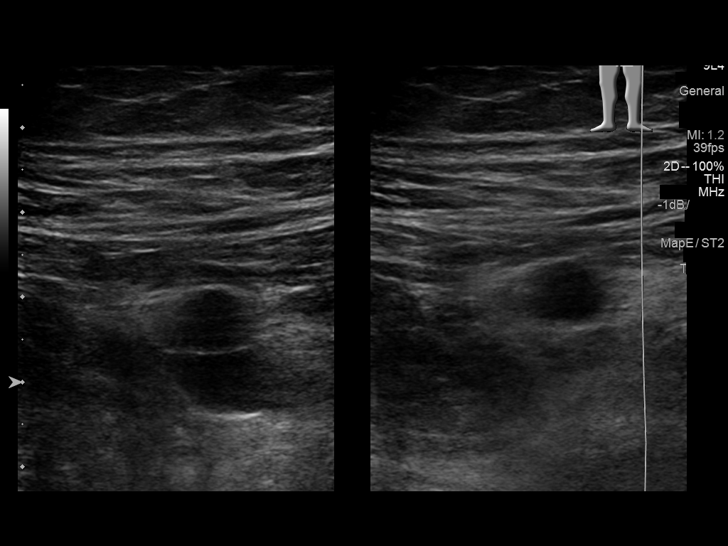
[im 19/37]
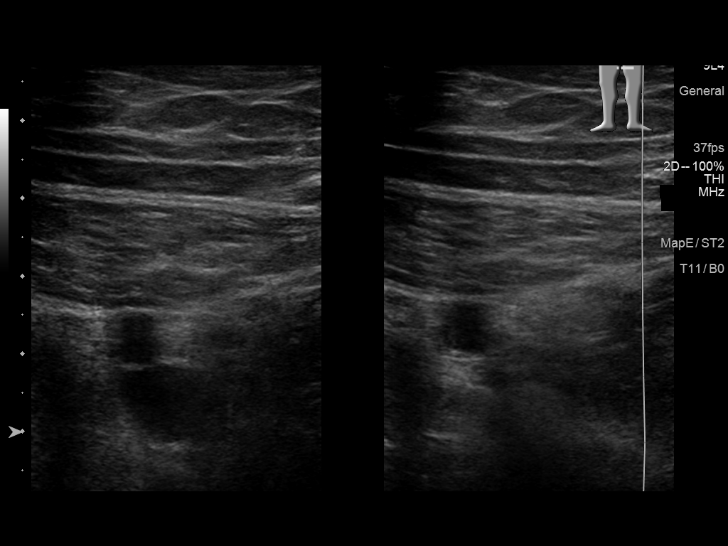
[im 21/37]
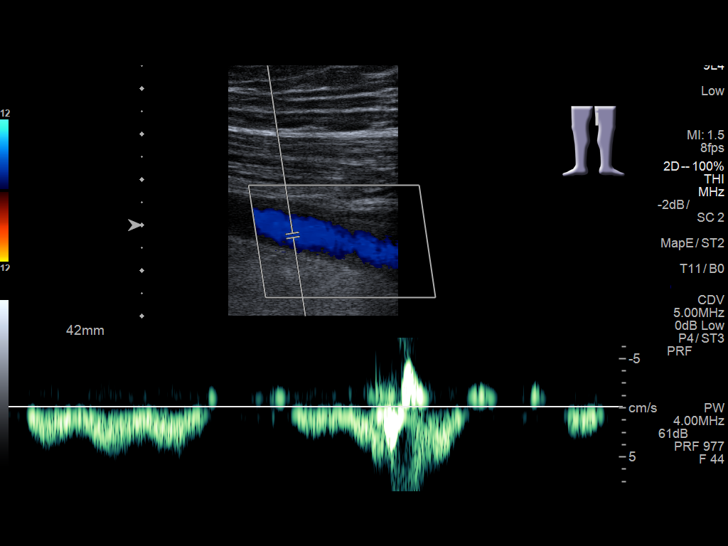
[im 24/37]
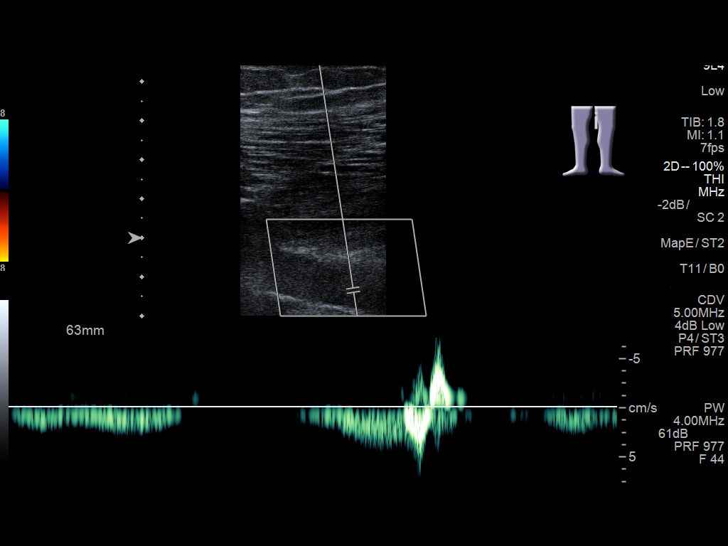
[im 27/37]
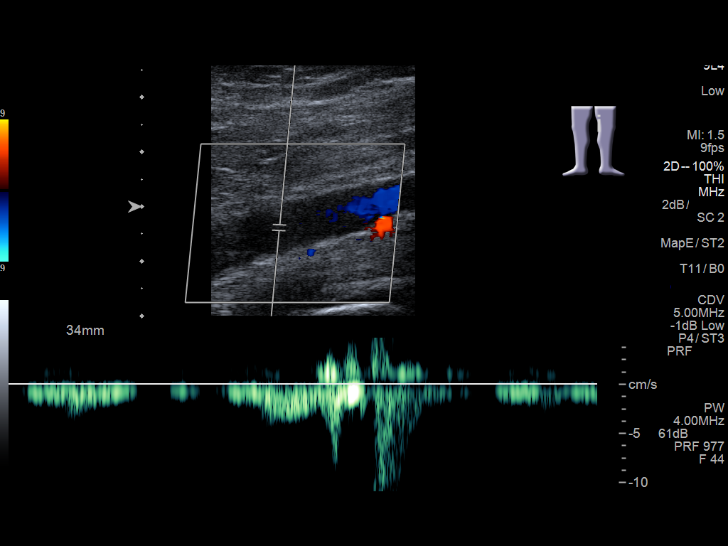
[im 30/37]
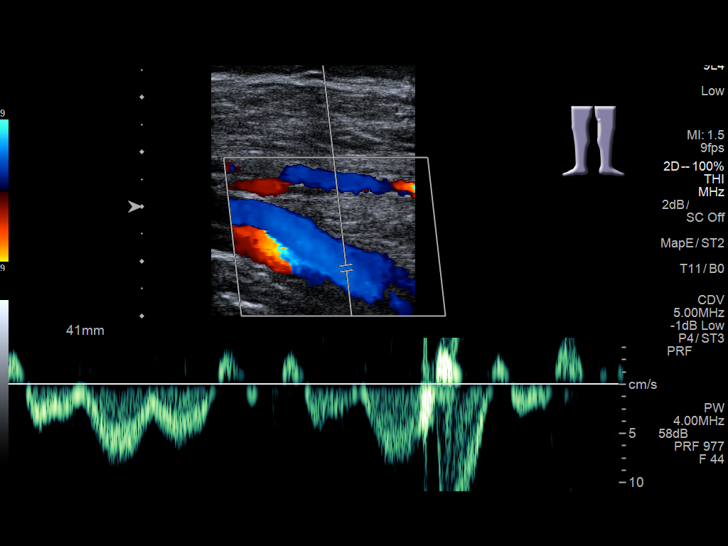
[im 33/37]
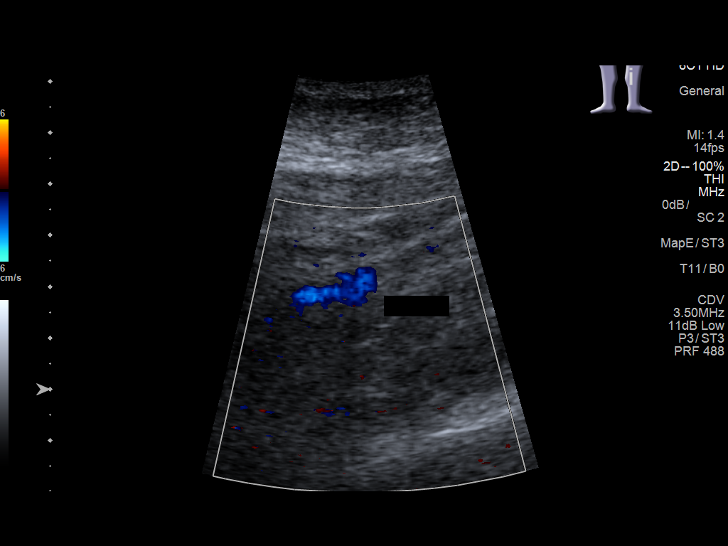
[im 37/37]
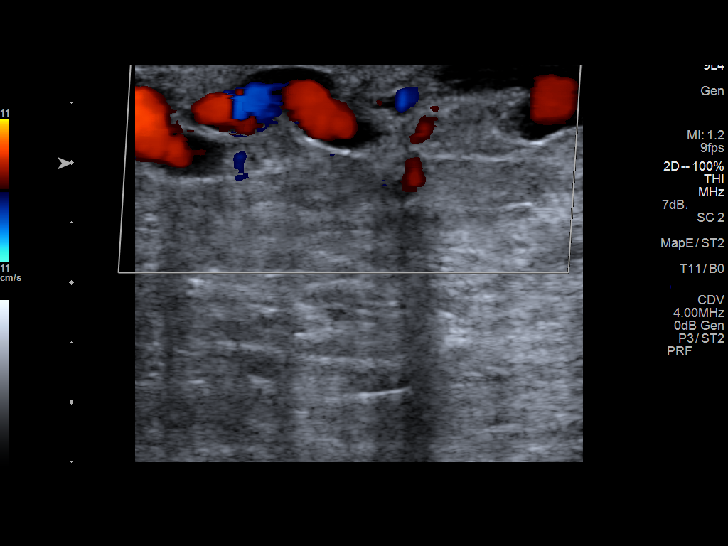

[13 of 24 positions shown; findings below may reference images not displayed]

FINDINGS: Contralateral Common Femoral Vein: Respiratory phasicity is normal
and symmetric with the symptomatic side. No evidence of thrombus.
Normal compressibility.

Common Femoral Vein: No evidence of thrombus. Normal
compressibility, respiratory phasicity and response to augmentation.

Saphenofemoral Junction: No evidence of thrombus. Normal
compressibility and flow on color Doppler imaging.

Profunda Femoral Vein: No evidence of thrombus. Normal
compressibility and flow on color Doppler imaging.

Femoral Vein: No evidence of thrombus. Normal compressibility,
respiratory phasicity and response to augmentation.

Popliteal Vein: No evidence of thrombus. Normal compressibility,
respiratory phasicity and response to augmentation.

Calf Veins: No evidence of thrombus. Normal compressibility and flow
on color Doppler imaging.

Superficial Great Saphenous Vein: No evidence of thrombus. Normal
compressibility and flow on color Doppler imaging.

Venous Reflux:  None.

Other Findings:  Venous varicosities noted at LEFT calf.
IMPRESSION: No evidence of DVT within the LEFT lower extremity.

Venous varicosities identified at LEFT calf.

## 2019-03-30 ENCOUNTER — Encounter: Payer: Self-pay | Admitting: Family Medicine

## 2019-03-30 ENCOUNTER — Ambulatory Visit (INDEPENDENT_AMBULATORY_CARE_PROVIDER_SITE_OTHER): Payer: BC Managed Care – PPO | Admitting: Family Medicine

## 2019-03-30 VITALS — BP 117/60 | Wt 250.0 lb

## 2019-03-30 DIAGNOSIS — F900 Attention-deficit hyperactivity disorder, predominantly inattentive type: Secondary | ICD-10-CM | POA: Diagnosis not present

## 2019-03-30 MED ORDER — AMPHETAMINE-DEXTROAMPHET ER 25 MG PO CP24: 25.0000 mg | ORAL_CAPSULE | ORAL | 0 refills | Status: DC

## 2019-03-30 MED ORDER — AMPHETAMINE-DEXTROAMPHETAMINE 10 MG PO TABS: 10.0000 mg | ORAL_TABLET | Freq: Every day | ORAL | 0 refills | Status: DC

## 2019-03-30 MED ORDER — AMPHETAMINE-DEXTROAMPHETAMINE 10 MG PO TABS: 10.0000 mg | ORAL_TABLET | Freq: Every day | ORAL | 0 refills | Status: DC | PRN

## 2019-03-30 NOTE — Progress Notes (Signed)
Name: Christian Hamilton   MRN: [id number]    DOB: 07/03/1986   Date:03/30/2019       Progress Note  Subjective  Chief Complaint  Chief Complaint  Patient presents with  . ADHD  . Medication Refill    I connected with  [name]  on 03/30/19 at  3:20 PM EDT by a video enabled telemedicine application and verified that I am speaking with the correct person using two identifiers.  I discussed the limitations of evaluation and management by telemedicine and the availability of in person appointments. The patient expressed understanding and agreed to proceed. Staff also discussed with the patient that there may be a patient responsible charge related to this service. 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 BP has been at goal , it was checked at home and normal. No chest pain, palpitation, aggression or tics.    Patient Active Problem List   Diagnosis Date Noted  . DDD (degenerative disc disease), cervical 11/04/2017  . Varicocele 07/25/2016  . Neck pain 10/12/2015  . Panic attack as reaction to stress 06/02/2015  . ADD (attention deficit disorder) 05/30/2015  . Back pain, chronic 05/30/2015  . Chronic venous insufficiency 05/30/2015  . Abnormal antinuclear antibody titer 05/30/2015  . Migraine without aura and responsive to treatment 05/10/2009  . 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 Obesity (BMI 30-39.9) 08/08/2008    Past Surgical History:  Procedure Laterality Date  . COLONOSCOPY WITH PROPOFOL N/A 07/23/2018   Procedure: COLONOSCOPY WITH PROPOFOL;  Surgeon: Lin Landsman, MD;  Location: [facility] ENDOSCOPY;  Service: Gastroenterology;  Laterality: N/A;  . LEG SURGERY    .  SHOULDER SURGERY    . TONSILECTOMY, ADENOIDECTOMY, BILATERAL MYRINGOTOMY AND TUBES    . tubes in ears      Family History  Problem Relation Age of Onset  . Kidney disease Father   . ADD / ADHD Brother     Social History   Socioeconomic History  . Marital status: Married    Spouse name: [name]  . Number of children: 3  . Years of education: Not on file  . Highest education level: Not on file  Occupational History  . Occupation: Freight forwarder    Comment: [employer]  Social Needs  . Financial resource strain: Not hard at all  . Food insecurity:    Worry: Never true    Inability: Never true  . Transportation needs:    Medical: No    Non-medical: No  Tobacco Use  . Smoking status: Never Smoker  . Smokeless tobacco: Never Used  Substance and Sexual Activity  . Alcohol use: Yes    Alcohol/week: 6.0 standard drinks    Types: 6 Cans of beer per week  . Drug use: No  . Sexual activity: Yes  Lifestyle  . Physical activity:    Days per week: 3 days    Minutes per session: 60 min  . Stress: Not at all  Relationships  . Social connections:    Talks on phone: More than three times a week    Gets together: More than three times a week    Attends religious service: More than  4 times per year    Active member of club or organization: Yes    Attends meetings of clubs or organizations: 1 to 4 times per year    Relationship status: Married  . Intimate partner violence:    Fear of current or ex partner: No    Emotionally abused: No    Physically abused: No    Forced sexual activity: No  Other Topics Concern  . Not on file  Social History Narrative   He is married and has 3 children     Current Outpatient Medications:  .  amphetamine-dextroamphetamine (ADDERALL XR) 25 MG 24 hr capsule, Take 1 capsule by mouth every morning., Disp: 30 capsule, Rfl: 0 .  amphetamine-dextroamphetamine (ADDERALL XR) 25 MG 24 hr capsule, Take 1 capsule by mouth every morning., Disp: 30  capsule, Rfl: 0 .  amphetamine-dextroamphetamine (ADDERALL XR) 25 MG 24 hr capsule, Take 1 capsule by mouth every morning., Disp: 30 capsule, Rfl: 0 .  amphetamine-dextroamphetamine (ADDERALL) 10 MG tablet, Take 1 tablet (10 mg total) by mouth daily with breakfast., Disp: 30 tablet, Rfl: 0 .  amphetamine-dextroamphetamine (ADDERALL) 10 MG tablet, Take 1 tablet (10 mg total) by mouth daily with breakfast., Disp: 30 tablet, Rfl: 0 .  amphetamine-dextroamphetamine (ADDERALL) 10 MG tablet, Take 1 tablet (10 mg total) by mouth daily as needed (at lunch)., Disp: 30 tablet, Rfl: 0 .  cyclobenzaprine (FLEXERIL) 10 MG tablet, Take 1 tablet (10 mg total) by mouth at bedtime., Disp: 30 tablet, Rfl: 0  No Known Allergies  I personally reviewed active problem list, medication list, allergies, family history, social history with the patient/caregiver today.   ROS  Ten systems reviewed and is negative except as mentioned in HPI   Objective  Virtual encounter, vitals obtained at home  There is no height or weight on file to calculate BMI.  Physical Exam  Awake, alert and oriented, no distress   PHQ2/9: Depression screen [facility] 2/9 03/30/2019 01/15/2019 07/23/2018 06/02/2018 02/27/2018  Decreased Interest 0 0 0 0 0  Down, Depressed, Hopeless 0 0 0 0 0  PHQ - 2 Score 0 0 0 0 0  Altered sleeping 0 0 - 0 -  Tired, decreased energy 0 0 - 1 -  Change in appetite 0 0 - 0 -  Feeling bad or failure about yourself  0 0 - 0 -  Trouble concentrating 0 0 - 2 -  Moving slowly or fidgety/restless 0 0 - 0 -  Suicidal thoughts 0 0 - 0 -  PHQ-9 Score 0 0 - 3 -  Difficult doing work/chores Not difficult at all - - Somewhat difficult -   PHQ-2/9 Result is negative.    Fall Risk: Fall Risk  03/30/2019 01/15/2019 09/11/2018 07/23/2018 06/02/2018  Falls in the past year? 0 0 0 No No  Number falls in past yr: 0 0 0 - -  Injury with Fall? 0 0 0 - -     Assessment & Plan  1. Attention deficit hyperactivity disorder  (ADHD), predominantly inattentive type  - amphetamine-dextroamphetamine (ADDERALL XR) 25 MG 24 hr capsule; Take 1 capsule by mouth every morning.  Dispense: 30 capsule; Refill: 0 - amphetamine-dextroamphetamine (ADDERALL XR) 25 MG 24 hr capsule; Take 1 capsule by mouth every morning.  Dispense: 30 capsule; Refill: 0 - amphetamine-dextroamphetamine (ADDERALL XR) 25 MG 24 hr capsule; Take 1 capsule by mouth every morning.  Dispense: 30 capsule; Refill: 0 - amphetamine-dextroamphetamine (ADDERALL) 10 MG tablet; Take 1 tablet (10 mg total)  by mouth daily with breakfast.  Dispense: 30 tablet; Refill: 0 - amphetamine-dextroamphetamine (ADDERALL) 10 MG tablet; Take 1 tablet (10 mg total) by mouth daily with breakfast.  Dispense: 30 tablet; Refill: 0 - amphetamine-dextroamphetamine (ADDERALL) 10 MG tablet; Take 1 tablet (10 mg total) by mouth daily as needed (at lunch).  Dispense: 30 tablet; Refill: 0 I discussed the assessment and treatment plan with the patient. The patient was provided an opportunity to ask questions and all were answered. The patient agreed with the plan and demonstrated an understanding of the instructions.  The patient was advised to call back or seek an in-person evaluation if the symptoms worsen or if the condition fails to improve as anticipated.  I provided 15  minutes of non-face-to-face time during this encounter.

## 2019-09-06 DIAGNOSIS — Z20828 Contact with and (suspected) exposure to other viral communicable diseases: Secondary | ICD-10-CM | POA: Diagnosis not present

## 2019-09-08 ENCOUNTER — Telehealth: Payer: BC Managed Care – PPO | Admitting: Family Medicine

## 2019-09-17 ENCOUNTER — Encounter: Payer: Self-pay | Admitting: Family Medicine

## 2019-10-11 ENCOUNTER — Encounter: Payer: Self-pay | Admitting: Family Medicine

## 2019-10-11 ENCOUNTER — Ambulatory Visit (INDEPENDENT_AMBULATORY_CARE_PROVIDER_SITE_OTHER): Payer: BC Managed Care – PPO | Admitting: Family Medicine

## 2019-10-11 ENCOUNTER — Other Ambulatory Visit: Payer: Self-pay

## 2019-10-11 VITALS — BP 130/88 | HR 95 | Temp 97.5°F | Resp 16 | Ht 73.0 in | Wt 270.5 lb

## 2019-10-11 DIAGNOSIS — E559 Vitamin D deficiency, unspecified: Secondary | ICD-10-CM | POA: Diagnosis not present

## 2019-10-11 DIAGNOSIS — Z1159 Encounter for screening for other viral diseases: Secondary | ICD-10-CM

## 2019-10-11 DIAGNOSIS — Z131 Encounter for screening for diabetes mellitus: Secondary | ICD-10-CM | POA: Diagnosis not present

## 2019-10-11 DIAGNOSIS — Z79899 Other long term (current) drug therapy: Secondary | ICD-10-CM | POA: Diagnosis not present

## 2019-10-11 DIAGNOSIS — Z13220 Encounter for screening for lipoid disorders: Secondary | ICD-10-CM | POA: Diagnosis not present

## 2019-10-11 DIAGNOSIS — Z0000 Encounter for general adult medical examination without abnormal findings: Secondary | ICD-10-CM | POA: Diagnosis not present

## 2019-10-11 DIAGNOSIS — Z3009 Encounter for other general counseling and advice on contraception: Secondary | ICD-10-CM | POA: Diagnosis not present

## 2019-10-11 DIAGNOSIS — F900 Attention-deficit hyperactivity disorder, predominantly inattentive type: Secondary | ICD-10-CM | POA: Diagnosis not present

## 2019-10-11 MED ORDER — AMPHETAMINE-DEXTROAMPHETAMINE 15 MG PO TABS: 15.0000 mg | ORAL_TABLET | Freq: Two times a day (BID) | ORAL | 0 refills | Status: DC

## 2019-10-11 NOTE — Patient Instructions (Signed)
Preventive Care 19-35 Years Old, Male Preventive care refers to lifestyle choices and visits with your health care provider that can promote health and wellness. This includes:  A yearly physical exam. This is also called an annual well check.  Regular dental and eye exams.  Immunizations.  Screening for certain conditions.  Healthy lifestyle choices, such as eating a healthy diet, getting regular exercise, not using drugs or products that contain nicotine and tobacco, and limiting alcohol use. What can I expect for my preventive care visit? Physical exam Your health care provider will check:  Height and weight. These may be used to calculate body mass index (BMI), which is a measurement that tells if you are at a healthy weight.  Heart rate and blood pressure.  Your skin for abnormal spots. Counseling Your health care provider may ask you questions about:  Alcohol, tobacco, and drug use.  Emotional well-being.  Home and relationship well-being.  Sexual activity.  Eating habits.  Work and work Statistician. What immunizations do I need?  Influenza (flu) vaccine  This is recommended every year. Tetanus, diphtheria, and pertussis (Tdap) vaccine  You may need a Td booster every 10 years. Varicella (chickenpox) vaccine  You may need this vaccine if you have not already been vaccinated. Human papillomavirus (HPV) vaccine  If recommended by your health care provider, you may need three doses over 6 months. Measles, mumps, and rubella (MMR) vaccine  You may need at least one dose of MMR. You may also need a second dose. Meningococcal conjugate (MenACWY) vaccine  One dose is recommended if you are 45-76 years old and a Market researcher living in a residence hall, or if you have one of several medical conditions. You may also need additional booster doses. Pneumococcal conjugate (PCV13) vaccine  You may need this if you have certain conditions and were not  previously vaccinated. Pneumococcal polysaccharide (PPSV23) vaccine  You may need one or two doses if you smoke cigarettes or if you have certain conditions. Hepatitis A vaccine  You may need this if you have certain conditions or if you travel or work in places where you may be exposed to hepatitis A. Hepatitis B vaccine  You may need this if you have certain conditions or if you travel or work in places where you may be exposed to hepatitis B. Haemophilus influenzae type b (Hib) vaccine  You may need this if you have certain risk factors. You may receive vaccines as individual doses or as more than one vaccine together in one shot (combination vaccines). Talk with your health care provider about the risks and benefits of combination vaccines. What tests do I need? Blood tests  Lipid and cholesterol levels. These may be checked every 5 years starting at age 17.  Hepatitis C test.  Hepatitis B test. Screening   Diabetes screening. This is done by checking your blood sugar (glucose) after you have not eaten for a while (fasting).  Sexually transmitted disease (STD) testing. Talk with your health care provider about your test results, treatment options, and if necessary, the need for more tests. Follow these instructions at home: Eating and drinking   Eat a diet that includes fresh fruits and vegetables, whole grains, lean protein, and low-fat dairy products.  Take vitamin and mineral supplements as recommended by your health care provider.  Do not drink alcohol if your health care provider tells you not to drink.  If you drink alcohol: ? Limit how much you have to 0-2  drinks a day. ? Be aware of how much alcohol is in your drink. In the [place] one drink equals one 12 oz bottle of beer (355 mL), one 5 oz glass of wine (148 mL), or one 1 oz glass of hard liquor (44 mL). Lifestyle  Take daily care of your teeth and gums.  Stay active. Exercise for at least 30 minutes on 5 or  more days each week.  Do not use any products that contain nicotine or tobacco, such as cigarettes, e-cigarettes, and chewing tobacco. If you need help quitting, ask your health care provider.  If you are sexually active, practice safe sex. Use a condom or other form of protection to prevent STIs (sexually transmitted infections). What's next?  Go to your health care provider once a year for a well check visit.  Ask your health care provider how often you should have your eyes and teeth checked.  Stay up to date on all vaccines. This information is not intended to replace advice given to you by your health care provider. Make sure you discuss any questions you have with your health care provider. Document Released: 12/10/2001 Document Revised: 10/08/2018 Document Reviewed: 10/08/2018 Elsevier Patient Education  2020 Elsevier Inc.  

## 2019-10-11 NOTE — Progress Notes (Signed)
Name: Christian Hamilton   MRN: [id number]    DOB: 02/24/1986   Date:10/11/2019       Progress Note  Subjective  Chief Complaint  Chief Complaint  Patient presents with  . 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 His schedule is varies and still long hours, hard to take a extended release medication because his working hours. He states bp at home has been at goal, it is elevated today and we will recheck before he leaves our office. 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, he is feeling tired since, it happens in waves, still has lack of sense of smell and taste and appetite is not perfect yet   USPSTF grade A and B recommendations:  Diet: they cook at home, he goes home for lunch most of the time, but sometimes packs lunch Exercise: not since COVID pandemic , except for a physical work   Depression: phq 9 is negative Depression screen [facility] 2/9 10/11/2019 03/30/2019 01/15/2019 07/23/2018 06/02/2018  Decreased Interest 0 0 0 0 0  Down, Depressed, Hopeless 0 0 0 0 0  PHQ - 2 Score 0 0 0 0 0  Altered sleeping 0 0 0 - 0  Tired, decreased energy 0 0 0 - 1  Change in appetite 0 0 0 - 0  Feeling bad or failure about yourself  0 0 0 - 0  Trouble concentrating 0 0 0 - 2  Moving slowly or fidgety/restless 0 0 0 - 0  Suicidal thoughts - 0 0 - 0  PHQ-9 Score 0 0 0 - 3  Difficult doing work/chores - Not difficult at all - - Somewhat difficult    Hypertension:  BP Readings from Last 3 Encounters:  10/11/19 130/88  03/30/19 117/60  01/15/19 126/84    Obesity: Wt Readings from Last 3 Encounters:  10/11/19 270 lb 8 oz (122.7 kg)   03/30/19 250 lb (113.4 kg)  01/15/19 259 lb 14.4 oz (117.9 kg)   BMI Readings from Last 3 Encounters:  10/11/19 35.69 kg/m  03/30/19 32.10 kg/m  01/15/19 33.37 kg/m     Lipids:  Lab Results  Component Value Date   CHOL 161 01/26/2018   CHOL 166 07/25/2016   Lab Results  Component Value Date   HDL 50 01/26/2018   HDL 53 07/25/2016   Lab Results  Component Value Date   LDLCALC 97 01/26/2018   LDLCALC 103 07/25/2016   Lab Results  Component Value Date   TRIG 59 01/26/2018   TRIG 52 07/25/2016   Lab Results  Component Value Date   CHOLHDL 3.2 01/26/2018   CHOLHDL 3.1 07/25/2016   No results found for: LDLDIRECT Glucose:  Glucose, Bld  Date Value Ref Range Status  01/26/2018 92 65 - 99 mg/dL Final    Comment:    .            Fasting reference interval .   03/20/2017 99 65 - 99 mg/dL Final  07/25/2016 91 65 - 99 mg/dL Final      [facility]  AUDIT-C Score  3       Married STD testing and prevention (HIV/chl/gon/syphilis): declined Hep C: today   Skin cancer: Discussed monitoring for atypical lesions Colorectal cancer: 2019 for evaluation of rectal bleeding and it was due to hemorrhoids, but no problems since  Prostate cancer: discussed USPTF   IPSS Questionnaire (AUA-7): Over the past month.   1)  How often have you had a sensation of not emptying your bladder completely after you finish urinating?  0 - Not at all  2)  How often have you had to urinate again less than two hours after you finished urinating? 1 - Less than 1 time in 5  3)  How often have you found you stopped and started again several times when you urinated?  0 - Not at all  4) How difficult have you found it to postpone urination?  0 - Not at all  5) How often have you had a weak urinary stream?  0 - Not at all  6) How often have you had to push or strain to begin urination?  0 - Not at all  7) How many times did you most  typically get up to urinate from the time you went to bed until the time you got up in the morning?  0 - None  Total score:  0-7 mildly symptomatic   8-19 moderately symptomatic   20-35 severely symptomatic     ECG:  2018  Advanced Care Planning: A voluntary discussion about advance care planning including the explanation and discussion of advance directives.  Discussed health care proxy and Living will, and the patient was able to identify a health care proxy as wife .  Patient does not have a living will at present time  Patient Active Problem List   Diagnosis Date Noted  . DDD (degenerative disc disease), cervical 11/04/2017  . Varicocele 07/25/2016  . Neck pain 10/12/2015  . Panic attack as reaction to stress 06/02/2015  . ADD (attention deficit disorder) 05/30/2015  . Back pain, chronic 05/30/2015  . Chronic venous insufficiency 05/30/2015  . Abnormal antinuclear antibody titer 05/30/2015  . Migraine without aura and responsive to treatment 05/10/2009  . 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 Obesity (BMI 30-39.9) 08/08/2008    Past Surgical History:  Procedure Laterality Date  . COLONOSCOPY WITH PROPOFOL N/A 07/23/2018   Procedure: COLONOSCOPY WITH PROPOFOL;  Surgeon: Lin Landsman, MD;  Location: [facility] ENDOSCOPY;  Service: Gastroenterology;  Laterality: N/A;  . LEG SURGERY    . SHOULDER SURGERY    . TONSILECTOMY, ADENOIDECTOMY, BILATERAL MYRINGOTOMY AND TUBES    . tubes in ears      Family History  Problem Relation Age of Onset  . Kidney disease Father   . ADD / ADHD Brother     Social History   Socioeconomic History  . Marital status: Married    Spouse name: [name]  . Number of children: 3  . Years of education: Not on file  . Highest education level: Not on file  Occupational History  . Occupation: Freight forwarder    CommentMudlogger  Tobacco Use  . Smoking status: Never Smoker  . Smokeless tobacco: Never Used  Substance and  Sexual Activity  . Alcohol use: Yes    Alcohol/week: 6.0 standard drinks    Types: 6 Cans of beer per week  . Drug use: No  . Sexual activity: Yes  Other Topics Concern  . Not  on file  Social History Narrative   He is married and has 3 children   Social Determinants of Radio broadcast assistant Strain: Low Risk   . Difficulty of Paying Living Expenses: Not very hard  Food Insecurity: No Food Insecurity  . Worried About Charity fundraiser in the Last Year: Never true  . Ran Out of Food in the Last Year: Never true  Transportation Needs: No Transportation Needs  . Lack of Transportation (Medical): No  . Lack of Transportation (Non-Medical): No  Physical Activity: Inactive  . Days of Exercise per Week: 0 days  . Minutes of Exercise per Session: 0 min  Stress: No Stress Concern Present  . Feeling of Stress : Not at all  Social Connections: Somewhat Isolated  . Frequency of Communication with Friends and Family: More than three times a week  . Frequency of Social Gatherings with Friends and Family: More than three times a week  . Attends Religious Services: Never  . Active Member of Clubs or Organizations: No  . Attends Archivist Meetings: Never  . Marital Status: Married  Human resources officer Violence: Not At Risk  . Fear of Current or Ex-Partner: No  . Emotionally Abused: No  . Physically Abused: No  . Sexually Abused: No     Current Outpatient Medications:  .  amphetamine-dextroamphetamine (ADDERALL) 15 MG tablet, Take 1 tablet by mouth 2 (two) times daily with a meal., Disp: 60 tablet, Rfl: 0 .  cyclobenzaprine (FLEXERIL) 10 MG tablet, Take 1 tablet (10 mg total) by mouth at bedtime., Disp: 30 tablet, Rfl: 0  No Known Allergies   ROS  Constitutional: Negative for fever or weight change.  Respiratory: Negative for cough and shortness of breath.   Cardiovascular: Negative for chest pain or palpitations.  Gastrointestinal: Negative for abdominal pain, no  bowel changes.  Musculoskeletal: Negative for gait problem or joint swelling.  Skin: Negative for rash.  Neurological: Negative for dizziness or headache.  No other specific complaints in a complete review of systems (except as listed in HPI above).  Objective   Vitals:   10/11/19 1405 10/11/19 1441  BP: (!) 144/88 130/88  Pulse: 95   Resp: 16   Temp: (!) 97.5 F (36.4 C)   TempSrc: Temporal   SpO2: 98%   Weight: 270 lb 8 oz (122.7 kg)   Height: 6\' 1"  (1.854 m)     Body mass index is 35.69 kg/m.  Physical Exam  Constitutional: Patient appears well-developed and well-nourished. No distress.  HENT: Head: Normocephalic and atraumatic. Ears: B TMs ok, no erythema or effusion; Nose: Nose normal. Mouth/Throat: Oropharynx is clear and moist. No oropharyngeal exudate.  Eyes: Conjunctivae and EOM are normal. Pupils are equal, round, and reactive to light. No scleral icterus.  Neck: Normal range of motion. Neck supple. No JVD present. No thyromegaly present.  Cardiovascular: Normal rate, regular rhythm and normal heart sounds.  No murmur heard. No BLE edema. Pulmonary/Chest: Effort normal and breath sounds normal. No respiratory distress. Abdominal: Soft. Bowel sounds are normal, no distension. There is no tenderness. no masses MALE GENITALIA: Normal descended testes bilaterally,left larger than right and has varicocele no hernias, no lesions, no discharge RECTAL:not done  Musculoskeletal: Normal range of motion, no joint effusions. Scar from left shoulder surgery  Neurological: he is alert and oriented to person, place, and time. No cranial nerve deficit. Coordination, balance, strength, speech and gait are normal.  Skin: Skin is warm and dry. He has  varicose veins on left lower leg and some hyperpigmentation   Psychiatric: Patient has a normal mood and affect. behavior is normal. Judgment and thought content normal.  PHQ2/9: Depression screen [facility] 2/9 10/11/2019 03/30/2019 01/15/2019  07/23/2018 06/02/2018  Decreased Interest 0 0 0 0 0  Down, Depressed, Hopeless 0 0 0 0 0  PHQ - 2 Score 0 0 0 0 0  Altered sleeping 0 0 0 - 0  Tired, decreased energy 0 0 0 - 1  Change in appetite 0 0 0 - 0  Feeling bad or failure about yourself  0 0 0 - 0  Trouble concentrating 0 0 0 - 2  Moving slowly or fidgety/restless 0 0 0 - 0  Suicidal thoughts - 0 0 - 0  PHQ-9 Score 0 0 0 - 3  Difficult doing work/chores - Not difficult at all - - Somewhat difficult     Fall Risk: Fall Risk  10/11/2019 03/30/2019 01/15/2019 09/11/2018 07/23/2018  Falls in the past year? 0 0 0 0 No  Number falls in past yr: 0 0 0 0 -  Injury with Fall? 0 0 0 0 -     Functional Status Survey: Is the patient deaf or have difficulty hearing?: No Does the patient have difficulty seeing, even when wearing glasses/contacts?: No Does the patient have difficulty concentrating, remembering, or making decisions?: No Does the patient have difficulty walking or climbing stairs?: No Does the patient have difficulty dressing or bathing?: No Does the patient have difficulty doing errands alone such as visiting a doctor's office or shopping?: No    Assessment & Plan  1. Attention deficit hyperactivity disorder (ADHD), predominantly inattentive type  - amphetamine-dextroamphetamine (ADDERALL) 15 MG tablet; Take 1 tablet by mouth 2 (two) times daily with a meal.  Dispense: 60 tablet; Refill: 0  2. Annual physical exam  - COMPLETE METABOLIC PANEL WITH GFR - CBC with Differential/Platelet  3. Lipid screening  - Lipid panel  4. Diabetes mellitus screening  - Hemoglobin A1c  5. Need for hepatitis C screening test  - Hepatitis C antibody  6. Vitamin D deficiency  - VITAMIN D 25 Hydroxy (Vit-D Deficiency, Fractures)  7. Long-term use of high-risk medication  - COMPLETE METABOLIC PANEL WITH GFR  8. Encounter for vasectomy counseling  - Ambulatory referral to Urology   -Prostate cancer screening and PSA  options (with potential risks and benefits of testing vs not testing) were discussed along with recent recs/guidelines. -USPSTF grade A and B recommendations reviewed with patient; age-appropriate recommendations, preventive care, screening tests, etc discussed and encouraged; healthy living encouraged; see AVS for patient education given to patient -Discussed importance of 150 minutes of physical activity weekly, eat two servings of fish weekly, eat one serving of tree nuts ( cashews, pistachios, pecans, almonds.Marland Kitchen) every other day, eat 6 servings of fruit/vegetables daily and drink plenty of water and avoid sweet beverages.

## 2019-10-12 LAB — COMPLETE METABOLIC PANEL WITH GFR
AG Ratio: 1.9 (calc) (ref 1.0–2.5)
ALT: 19 U/L (ref 9–46)
AST: 17 U/L (ref 10–40)
Albumin: 4.4 g/dL (ref 3.6–5.1)
Alkaline phosphatase (APISO): 37 U/L (ref 36–130)
BUN: 12 mg/dL (ref 7–25)
CO2: 30 mmol/L (ref 20–32)
Calcium: 9.5 mg/dL (ref 8.6–10.3)
Chloride: 102 mmol/L (ref 98–110)
Creat: 0.83 mg/dL (ref 0.60–1.35)
GFR, Est African American: 133 mL/min/{1.73_m2} (ref >=60)
GFR, Est Non African American: 115 mL/min/{1.73_m2} (ref >=60)
Globulin: 2.3 g/dL (calc) (ref 1.9–3.7)
Glucose, Bld: 93 mg/dL (ref 65–99)
Potassium: 3.9 mmol/L (ref 3.5–5.3)
Sodium: 141 mmol/L (ref 135–146)
Total Bilirubin: 0.7 mg/dL (ref 0.2–1.2)
Total Protein: 6.7 g/dL (ref 6.1–8.1)

## 2019-10-12 LAB — VITAMIN D 25 HYDROXY (VIT D DEFICIENCY, FRACTURES): Vit D, 25-Hydroxy: 19 ng/mL — ABNORMAL LOW (ref 30–100)

## 2019-10-12 LAB — HEPATITIS C ANTIBODY
Hepatitis C Ab: NONREACTIVE
SIGNAL TO CUT-OFF: 0.01 (ref <1.00)

## 2019-10-12 LAB — CBC WITH DIFFERENTIAL/PLATELET
Absolute Monocytes: 691 cells/uL (ref 200–950)
Basophils Absolute: 50 cells/uL (ref 0–200)
Basophils Relative: 0.7 %
Eosinophils Absolute: 137 cells/uL (ref 15–500)
Eosinophils Relative: 1.9 %
HCT: 43.6 % (ref 38.5–50.0)
Hemoglobin: 15.1 g/dL (ref 13.2–17.1)
Lymphs Abs: 2650 cells/uL (ref 850–3900)
MCH: 32.4 pg (ref 27.0–33.0)
MCHC: 34.6 g/dL (ref 32.0–36.0)
MCV: 93.6 fL (ref 80.0–100.0)
MPV: 11.6 fL (ref 7.5–12.5)
Monocytes Relative: 9.6 %
Neutro Abs: 3672 cells/uL (ref 1500–7800)
Neutrophils Relative %: 51 %
Platelets: 191 10*3/uL (ref 140–400)
RBC: 4.66 10*6/uL (ref 4.20–5.80)
RDW: 12.1 % (ref 11.0–15.0)
Total Lymphocyte: 36.8 %
WBC: 7.2 10*3/uL (ref 3.8–10.8)

## 2019-10-12 LAB — HEMOGLOBIN A1C
Hgb A1c MFr Bld: 5.3 % of total Hgb (ref <5.7)
Mean Plasma Glucose: 105 (calc)
eAG (mmol/L): 5.8 (calc)

## 2019-10-12 LAB — LIPID PANEL
Cholesterol: 169 mg/dL (ref <200)
HDL: 61 mg/dL (ref >=40)
LDL Cholesterol (Calc): 91 mg/dL (calc)
Non-HDL Cholesterol (Calc): 108 mg/dL (calc) (ref <130)
Total CHOL/HDL Ratio: 2.8 (calc) (ref <5.0)
Triglycerides: 77 mg/dL (ref <150)

## 2019-11-03 ENCOUNTER — Ambulatory Visit: Payer: BC Managed Care – PPO | Admitting: Urology

## 2019-11-03 ENCOUNTER — Other Ambulatory Visit: Payer: Self-pay

## 2019-11-03 VITALS — BP 139/84 | HR 91 | Ht 74.0 in | Wt 270.0 lb

## 2019-11-03 DIAGNOSIS — Z3009 Encounter for other general counseling and advice on contraception: Secondary | ICD-10-CM | POA: Diagnosis not present

## 2019-11-03 NOTE — Patient Instructions (Signed)
Pre-Vasectomy Instructions  STOP all aspirin or blood thinners (Aspirin, Plavix, Coumadin, Warfarin, Motrin, Ibuprofen, Advil, Aleve, Naproxen, Naprosyn) for 7 days prior to the procedure.  If you have any questions about stopping these medications please contact your primary care physician or cardiologist.  Shave all hair from the upper scrotum on the day of the procedure.  This means just under the penis onto the scrotal sac.  The area shaved should measure about 2-3 inches around.  You may lather the scrotum with soap and water, and shave with a safety razor.  After shaving the area, thoroughly wash the penis and the scrotum, then shower or bathe to remove all the loose hairs.  If needed, wash the area again just before coming in for your circumcision.  It is recommended to have a light meal an hour or so prior to the procedure.  Bring a scrotal support (jock strap or suspensory, or tight jockey shorts or underwear).  Wear comfortable pants or shorts.  While the actual procedure usually takes about 45 minutes, you should be prepared to stay in the office for approximately one hour.  Bring someone with you to drive you home.  If you have any questions or concerns, please feel free to call the office at [phone].    Vasectomy Postoperative Instructions  What to Expect  - slight redness, swelling and scant drainage along the incision  - mild to moderate discomfort  - black and blue (bruising) as the tissue heals  - low grade fever  - scrotal sensitivity and/or tenderness - Edges of the incision may pull apart and heal slowly, sometimes a knot may be present which remains for several months.  This is NORMAL and all part of the healing process. - if stitches are placed, they do not need to be removed - if you have pain or discomfort immediately after the vasectomy, you may use OTC pain medication for relief , ex: tylenol.  After local anesthetic wears off an ice pack will provide  additional comfort and can also prevent swelling if used  Activity  - no sexual intercourse for at lease 5 days depending on comfort  - no heavy lifting for 48-72 hours (anything over 5-10 lbs)  Wound Care  - shower only after 24 hours  - no tub baths, hot tub, or pools for at least 7 days  - ice packs for 48 hours: 30 minutes on and 30 minutes off  Problem to Report  - generalized redness  - increased pain and swelling  - fever greater than 101 F  - significant drainage or bleeding from the wound  TO DO - Ejaculations help to clear the passage of sperm, but you must use another from of birth control until you are told you may discontinue its use!! - You will be given a specimen cup to bring back a semen sample in 3 months to check and see if its clear of sperm.  Only after the semen is sent for analysis and is reported back as clear should you use this as your primary form of birth control!  

## 2019-11-03 NOTE — Progress Notes (Signed)
11/03/19 10:56 AM   [name] 1986-03-16 [id number]  Referring provider: Steele Sizer, MD [address] [place],  [place] [address]  CC: Discuss vasectomy  HPI: I saw Mr. Christian Hamilton in urology clinic in consultation from Dr. Ancil Boozer for vasectomy.  He is a healthy 34 year old male with 3 children, and he and his partner do not desire any further pregnancies.  He works in Press photographer.  He denies any family history of prostate cancer.  He denies any urinary symptoms or gross hematuria.  PMH: Past Medical History:  Diagnosis Date  . ADHD (attention deficit hyperactivity disorder)   . Anxiety   . Asthma   . Back pain   . DDD (degenerative disc disease), cervical   . Leg varices 05/30/2015   Dr. Hulda Humphrey   . Rectal bleeding     Surgical History: Past Surgical History:  Procedure Laterality Date  . COLONOSCOPY WITH PROPOFOL N/A 07/23/2018   Procedure: COLONOSCOPY WITH PROPOFOL;  Surgeon: Lin Landsman, MD;  Location: [facility] ENDOSCOPY;  Service: Gastroenterology;  Laterality: N/A;  . LEG SURGERY    . SHOULDER SURGERY    . TONSILECTOMY, ADENOIDECTOMY, BILATERAL MYRINGOTOMY AND TUBES    . tubes in ears      Allergies: No Known Allergies  Family History: Family History  Problem Relation Age of Onset  . Kidney disease Father   . ADD / ADHD Brother     Social History:  reports that he has never smoked. He has never used smokeless tobacco. He reports current alcohol use of about 6.0 standard drinks of alcohol per week. He reports that he does not use drugs.  ROS: Please see flowsheet from today's date for complete review of systems.  Physical Exam: BP 139/84   Pulse 91   Ht 6\' 2"  (1.88 m)   Wt 270 lb (122.5 kg)   BMI 34.67 kg/m    Constitutional:  Alert and oriented, No acute distress. Cardiovascular: No clubbing, cyanosis, or edema. Respiratory: Normal respiratory effort, no increased work of breathing. GI: Abdomen is soft, nontender, nondistended, no  abdominal masses GU: Normal-appearing phallus and meatus, testicles 20 cc and descended bilaterally without masses, large left varicocele, but vas deferens easily palpable bilaterally Lymph: No cervical or inguinal lymphadenopathy. Skin: No rashes, bruises or suspicious lesions. Neurologic: Grossly intact, no focal deficits, moving all 4 extremities. Psychiatric: Normal mood and affect.  Laboratory Data: None to review  Pertinent Imaging: None to review  Assessment & Plan:   In summary, the patient is a healthy 34 year old male who desires vasectomy for surgical sterilization.  We discussed the risks and benefits of vasectomy at length.  Vasectomy is intended to be a permanent form of contraception, and does not produce immediate sterility.  Following vasectomy another form of contraception is required until vas occlusion is confirmed by a post-vasectomy semen analysis obtained 2-3 months after the procedure.  Even after vas occlusion is confirmed, vasectomy is not 100% reliable in preventing pregnancy, and the failure rate is approximately 10/1998.  Repeat vasectomy is required in less than 1% of patients.  He should refrain from ejaculation for 1 week after vasectomy.  Options for fertility after vasectomy include vasectomy reversal, and sperm retrieval with in vitro fertilization or ICSI.  These options are not always successful and may be expensive.  Finally, there are other permanent and non-permanent alternatives to vasectomy available. There is no risk of erectile dysfunction, and the volume of semen will be similar to prior, as the  majority of the ejaculate is from the prostate and seminal vesicles.   The procedure takes ~20 minutes.  We recommend patients take 5-10 mg of Valium 30 minutes prior, and he will need a driver post-procedure.  Local anesthetic is injected into the scrotal skin and a small segment of the vas deferens is removed, and the ends occluded. The complication rate is  approximately 1-2%, and includes bleeding, infection, and development of chronic scrotal pain.  PLAN: Pending insurance approval, will schedule for vasectomy at his convenience   A total of 40 minutes were spent face-to-face with the patient, greater than 50% was spent in patient education, counseling, and coordination of care regarding risks, benefits, and alternatives to vasectomy.   Billey Co, [facility] [address] [place], [place] [address] [phone]

## 2019-11-18 ENCOUNTER — Other Ambulatory Visit: Payer: Self-pay | Admitting: Family Medicine

## 2019-11-18 DIAGNOSIS — F900 Attention-deficit hyperactivity disorder, predominantly inattentive type: Secondary | ICD-10-CM

## 2019-11-21 MED ORDER — AMPHETAMINE-DEXTROAMPHETAMINE 15 MG PO TABS: 15.0000 mg | ORAL_TABLET | Freq: Two times a day (BID) | ORAL | 0 refills | Status: DC

## 2019-12-09 ENCOUNTER — Encounter: Payer: Self-pay | Admitting: Urology

## 2019-12-29 ENCOUNTER — Other Ambulatory Visit: Payer: Self-pay | Admitting: Family Medicine

## 2019-12-29 DIAGNOSIS — F900 Attention-deficit hyperactivity disorder, predominantly inattentive type: Secondary | ICD-10-CM

## 2019-12-29 MED ORDER — AMPHETAMINE-DEXTROAMPHETAMINE 15 MG PO TABS: 15.0000 mg | ORAL_TABLET | Freq: Two times a day (BID) | ORAL | 0 refills | Status: DC

## 2020-01-10 ENCOUNTER — Telehealth (INDEPENDENT_AMBULATORY_CARE_PROVIDER_SITE_OTHER): Payer: BC Managed Care – PPO | Admitting: Family Medicine

## 2020-01-10 ENCOUNTER — Encounter: Payer: Self-pay | Admitting: Family Medicine

## 2020-01-10 DIAGNOSIS — F900 Attention-deficit hyperactivity disorder, predominantly inattentive type: Secondary | ICD-10-CM

## 2020-01-10 MED ORDER — AMPHETAMINE-DEXTROAMPHETAMINE 15 MG PO TABS: 15.0000 mg | ORAL_TABLET | Freq: Two times a day (BID) | ORAL | 0 refills | Status: DC

## 2020-01-10 MED ORDER — AMPHETAMINE-DEXTROAMPHETAMINE 15 MG PO TABS: 15.0000 mg | ORAL_TABLET | Freq: Every day | ORAL | 0 refills | Status: DC

## 2020-01-10 NOTE — Progress Notes (Signed)
Name: Christian Hamilton   MRN: [id number]    DOB: Mar 26, 1986   Date:01/10/2020       Progress Note  Subjective  Chief Complaint  Chief Complaint  Patient presents with  . Follow-up  . ADHD    I connected with  Christian Hamilton on 01/10/20 at  8:00 AM EDT by telephone and verified that I am speaking with the correct person using two identifiers.  I discussed the limitations, risks, security and privacy concerns of performing an evaluation and management service by telephone and the availability of in person appointments. Staff also discussed with the patient that there may be a patient responsible charge related to this service. 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 His schedule varies and when taking extended release it caused difficulty sleeping at night, with the immediate release he can adjust the time he takes second dose and it has helped him sleep better but still able to stay focused at work.  . He states bp at home has been at goal. He denies side effects of medication   Patient Active Problem List   Diagnosis Date Noted  . DDD (degenerative disc disease), cervical 11/04/2017  . Varicocele 07/25/2016  . Neck pain 10/12/2015  . Panic attack as reaction to stress 06/02/2015  . ADD (attention deficit disorder) 05/30/2015  . Back pain, chronic 05/30/2015  . Chronic venous insufficiency 05/30/2015  . Abnormal antinuclear antibody titer 05/30/2015  . Migraine without aura and responsive to treatment 05/10/2009  . 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 Obesity (BMI 30-39.9) 08/08/2008    Past Surgical History:  Procedure Laterality Date  . COLONOSCOPY WITH PROPOFOL N/A  07/23/2018   Procedure: COLONOSCOPY WITH PROPOFOL;  Surgeon: Lin Landsman, MD;  Location: [facility] ENDOSCOPY;  Service: Gastroenterology;  Laterality: N/A;  . LEG SURGERY    . SHOULDER SURGERY    . TONSILECTOMY, ADENOIDECTOMY, BILATERAL MYRINGOTOMY AND TUBES    . tubes in ears      Family History  Problem Relation Age of Onset  . Kidney disease Father   . ADD / ADHD Brother     Social History   Socioeconomic History  . Marital status: Married    Spouse name: Christian Hamilton  . Number of children: 3  . Years of education: Not on file  . Highest education level: Not on file  Occupational History  . Occupation: Freight forwarder    CommentMudlogger  Tobacco Use  . Smoking status: Never Smoker  . Smokeless tobacco: Never Used  Substance and Sexual Activity  . Alcohol use: Yes    Alcohol/week: 6.0 standard drinks    Types: 6 Cans of beer per week  . Drug use: No  . Sexual activity: Yes  Other Topics Concern  . Not on file  Social History Narrative   He is married and has 3 children   Social Determinants of Health   Financial Resource Strain: Low Risk   . Difficulty of Paying Living Expenses: Not very hard  Food Insecurity: No Food Insecurity  . Worried About Charity fundraiser in the Last Year: Never true  . Ran Out of Food  in the Last Year: Never true  Transportation Needs: No Transportation Needs  . Lack of Transportation (Medical): No  . Lack of Transportation (Non-Medical): No  Physical Activity: Inactive  . Days of Exercise per Week: 0 days  . Minutes of Exercise per Session: 0 min  Stress: No Stress Concern Present  . Feeling of Stress : Not at all  Social Connections: Somewhat Isolated  . Frequency of Communication with Friends and Family: More than three times a week  . Frequency of Social Gatherings with Friends and Family: More than three times a week  . Attends Religious Services: Never  . Active Member of Clubs or Organizations: No  . Attends Theatre manager Meetings: Never  . Marital Status: Married  Human resources officer Violence: Not At Risk  . Fear of Current or Ex-Partner: No  . Emotionally Abused: No  . Physically Abused: No  . Sexually Abused: No     Current Outpatient Medications:  .  amphetamine-dextroamphetamine (ADDERALL) 15 MG tablet, Take 1 tablet by mouth 2 (two) times daily with a meal., Disp: 60 tablet, Rfl: 0 .  cyclobenzaprine (FLEXERIL) 10 MG tablet, Take 1 tablet (10 mg total) by mouth at bedtime., Disp: 30 tablet, Rfl: 0  No Known Allergies  I personally reviewed active problem list, medication list, allergies, family history, social history with the patient/caregiver today.   ROS   Ten systems reviewed and is negative except as mentioned in HPI   Objective  Virtual encounter, vitals not obtained.  There is no height or weight on file to calculate BMI.  Physical Exam  Awake, alert and oriented  PHQ2/9: Depression screen [facility] 2/9 01/10/2020 10/11/2019 10/11/2019 03/30/2019 01/15/2019  Decreased Interest 0 0 0 0 0  Down, Depressed, Hopeless 0 0 0 0 0  PHQ - 2 Score 0 0 0 0 0  Altered sleeping 0 2 0 0 0  Tired, decreased energy 0 2 0 0 0  Change in appetite 0 2 0 0 0  Feeling bad or failure about yourself  0 0 0 0 0  Trouble concentrating 0 0 0 0 0  Moving slowly or fidgety/restless 0 0 0 0 0  Suicidal thoughts 0 0 - 0 0  PHQ-9 Score 0 6 0 0 0  Difficult doing work/chores - Somewhat difficult - Not difficult at all -  Some recent data might be hidden   PHQ-2/9 Result is negative.    Fall Risk: Fall Risk  01/10/2020 10/11/2019 03/30/2019 01/15/2019 09/11/2018  Falls in the past year? 0 0 0 0 0  Number falls in past yr: 0 0 0 0 0  Injury with Fall? 0 0 0 0 0    Assessment & Plan  1. Attention deficit hyperactivity disorder (ADHD), predominantly inattentive type  - amphetamine-dextroamphetamine (ADDERALL) 15 MG tablet; Take 1 tablet by mouth 2 (two) times daily with a meal. 03/06/2020  Dispense:  60 tablet; Refill: 0 - amphetamine-dextroamphetamine (ADDERALL) 15 MG tablet; Take 1 tablet by mouth 2 (two) times daily. Fill 02/09/2020  Dispense: 60 tablet; Refill: 0 - amphetamine-dextroamphetamine (ADDERALL) 15 MG tablet; Take 1 tablet by mouth 2 (two) times daily.  Dispense: 60 tablet; Refill: 0  I discussed the assessment and treatment plan with the patient. The patient was provided an opportunity to ask questions and all were answered. The patient agreed with the plan and demonstrated an understanding of the instructions.   The patient was advised to call back or seek an in-person evaluation if the  symptoms worsen or if the condition fails to improve as anticipated.  I provided  15 minutes of non-face-to-face time during this encounter.  Loistine Chance, MD

## 2020-03-05 ENCOUNTER — Other Ambulatory Visit: Payer: Self-pay

## 2020-03-05 ENCOUNTER — Emergency Department: Payer: BC Managed Care – PPO

## 2020-03-05 ENCOUNTER — Encounter: Payer: Self-pay | Admitting: Emergency Medicine

## 2020-03-05 ENCOUNTER — Emergency Department
Admission: EM | Admit: 2020-03-05 | Discharge: 2020-03-05 | Disposition: A | Discharge status: Home / Self Care | Payer: BC Managed Care – PPO | Attending: Emergency Medicine | Admitting: Emergency Medicine

## 2020-03-05 DIAGNOSIS — M5412 Radiculopathy, cervical region: Secondary | ICD-10-CM | POA: Diagnosis not present

## 2020-03-05 DIAGNOSIS — R29818 Other symptoms and signs involving the nervous system: Secondary | ICD-10-CM | POA: Insufficient documentation

## 2020-03-05 DIAGNOSIS — F909 Attention-deficit hyperactivity disorder, unspecified type: Secondary | ICD-10-CM | POA: Insufficient documentation

## 2020-03-05 DIAGNOSIS — Z79899 Other long term (current) drug therapy: Secondary | ICD-10-CM | POA: Diagnosis not present

## 2020-03-05 DIAGNOSIS — R079 Chest pain, unspecified: Secondary | ICD-10-CM | POA: Diagnosis not present

## 2020-03-05 DIAGNOSIS — R0789 Other chest pain: Secondary | ICD-10-CM | POA: Diagnosis not present

## 2020-03-05 DIAGNOSIS — J45909 Unspecified asthma, uncomplicated: Secondary | ICD-10-CM | POA: Insufficient documentation

## 2020-03-05 LAB — BASIC METABOLIC PANEL
Anion gap: 8 (ref 5–15)
BUN: 14 mg/dL (ref 6–20)
CO2: 27 mmol/L (ref 22–32)
Calcium: 9 mg/dL (ref 8.9–10.3)
Chloride: 102 mmol/L (ref 98–111)
Creatinine, Ser: 0.85 mg/dL (ref 0.61–1.24)
GFR calc Af Amer: >60 mL/min (ref >60)
GFR calc non Af Amer: >60 mL/min (ref >60)
Glucose, Bld: 115 mg/dL — ABNORMAL HIGH (ref 70–99)
Potassium: 3.6 mmol/L (ref 3.5–5.1)
Sodium: 137 mmol/L (ref 135–145)

## 2020-03-05 LAB — CBC
HCT: 44.8 % (ref 39.0–52.0)
Hemoglobin: 15.5 g/dL (ref 13.0–17.0)
MCH: 32.6 pg (ref 26.0–34.0)
MCHC: 34.6 g/dL (ref 30.0–36.0)
MCV: 94.3 fL (ref 80.0–100.0)
Platelets: 165 10*3/uL (ref 150–400)
RBC: 4.75 MIL/uL (ref 4.22–5.81)
RDW: 11.9 % (ref 11.5–15.5)
WBC: 7.9 10*3/uL (ref 4.0–10.5)
nRBC: 0.3 % — ABNORMAL HIGH (ref 0.0–0.2)

## 2020-03-05 LAB — TROPONIN I (HIGH SENSITIVITY)
Troponin I (High Sensitivity): 2 ng/L (ref <18)
Troponin I (High Sensitivity): <2 ng/L (ref <18)

## 2020-03-05 MED ORDER — SODIUM CHLORIDE 0.9% FLUSH: 3.0000 mL | Freq: Once | INTRAVENOUS | Status: DC

## 2020-03-05 MED ORDER — MELOXICAM 15 MG PO TABS: 15.0000 mg | ORAL_TABLET | Freq: Every day | ORAL | 0 refills | Status: DC

## 2020-03-05 NOTE — ED Provider Notes (Signed)
Medical screening examination/treatment/procedure(s) were conducted as a shared visit with non-physician practitioner(s) and myself.  I personally evaluated the patient during the encounter.  HEAR Score: 1   Personally saw and evaluated.  Patient resting comfortably.  Currently pain and symptom-free.  Agreeable with plan for close follow-up and return precautions.  Return precautions and treatment recommendations and follow-up discussed with the patient who is agreeable with the plan.    Delman Kitten, MD 03/05/20 2025

## 2020-03-05 NOTE — ED Notes (Signed)
Pt reports he is still having numbness in his hands and arms; chest pain has decreased to 2/10 at this time.

## 2020-03-05 NOTE — ED Provider Notes (Signed)
[facility] Emergency Department Provider Note  ____________________________________________   First MD Initiated Contact with Patient 03/05/20 1829     (approximate)  I have reviewed the triage vital signs and the nursing notes.   HISTORY  Chief Complaint Chest Pain   HPI Christian Hamilton is a 34 y.o. male with no previous cardiac history presents to the emergency department for evaluation of left-sided chest pain, left  neck pain and heaviness in the left arm.  Symptoms started this afternoon while at work.  He denies ever having had similar symptoms in the past.  He states that he has felt well the past few days except last night he awakened and felt kind of dizzy.  He went back to sleep and upon awakening this morning, he was fine.  He does have a history of cervical radiculopathy. He states that he had COVID-19 last fall and is just now getting his sense of smell back. He denies cough, fever, shortness of breath or other concerns. No alleviating measures prior to arrival.  Past Medical History:  Diagnosis Date  . ADHD (attention deficit hyperactivity disorder)   . Anxiety   . Asthma   . Back pain   . DDD (degenerative disc disease), cervical   . Leg varices 05/30/2015   Dr. Hulda Humphrey   . Rectal bleeding     Patient Active Problem List   Diagnosis Date Noted  . DDD (degenerative disc disease), cervical 11/04/2017  . Varicocele 07/25/2016  . Neck pain 10/12/2015  . Panic attack as reaction to stress 06/02/2015  . ADD (attention deficit disorder) 05/30/2015  . Back pain, chronic 05/30/2015  . Chronic venous insufficiency 05/30/2015  . Abnormal antinuclear antibody titer 05/30/2015  . Migraine without aura and responsive to treatment 05/10/2009  . 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 Obesity (BMI 30-39.9) 08/08/2008    Past Surgical History:  Procedure Laterality Date  . COLONOSCOPY WITH PROPOFOL N/A 07/23/2018   Procedure: COLONOSCOPY  WITH PROPOFOL;  Surgeon: Lin Landsman, MD;  Location: [facility] ENDOSCOPY;  Service: Gastroenterology;  Laterality: N/A;  . LEG SURGERY    . SHOULDER SURGERY    . TONSILECTOMY, ADENOIDECTOMY, BILATERAL MYRINGOTOMY AND TUBES    . tubes in ears      Prior to Admission medications   Medication Sig Start Date End Date Taking? Authorizing Provider  amphetamine-dextroamphetamine (ADDERALL) 15 MG tablet Take 1 tablet by mouth 2 (two) times daily with a meal. 03/06/2020 01/10/20   Sowles, Drue Stager, MD  amphetamine-dextroamphetamine (ADDERALL) 15 MG tablet Take 1 tablet by mouth 2 (two) times daily. Fill 02/09/2020 01/10/20   Steele Sizer, MD  amphetamine-dextroamphetamine (ADDERALL) 15 MG tablet Take 1 tablet by mouth 2 (two) times daily. 01/10/20   Steele Sizer, MD  cyclobenzaprine (FLEXERIL) 10 MG tablet Take 1 tablet (10 mg total) by mouth at bedtime. 09/11/18   Steele Sizer, MD  meloxicam (MOBIC) 15 MG tablet Take 1 tablet (15 mg total) by mouth daily. 03/05/20   Victorino Dike, FNP    Allergies Patient has no known allergies.  Family History  Problem Relation Age of Onset  . Kidney disease Father   . ADD / ADHD Brother     Social History Social History   Tobacco Use  . Smoking status: Never Smoker  . Smokeless tobacco: Never Used  Substance Use Topics  . Alcohol use: Yes    Alcohol/week: 6.0 standard drinks    Types: 6 Cans of beer per week  .  Drug use: No    Review of Systems  Constitutional: No fever/chills. Eyes: No visual changes. ENT: No sore throat. Cardiovascular: Positive for chest pain. Negative for pleuritic pain. Negative for palpitations. Negative for leg pain. Respiratory: Negative shortness of breath. Gastrointestinal: No abdominal pain. no nausea, no vomiting.  No diarrhea.  No constipation. Genitourinary: Negative for dysuria. Musculoskeletal: Negative for back pain.  Skin: Negative for rash, lesion, wound. Neurological: Negative for headaches,  focal weakness or numbness. ____________________________________________   PHYSICAL EXAM:  VITAL SIGNS: ED Triage Vitals  Enc Vitals Group     BP 03/05/20 1428 (!) 144/104     Pulse Rate 03/05/20 1428 90     Resp 03/05/20 1428 16     Temp 03/05/20 1428 98.2 F (36.8 C)     Temp Source 03/05/20 1428 Oral     SpO2 03/05/20 1428 97 %     Weight 03/05/20 1425 275 lb (124.7 kg)     Height 03/05/20 1425 6\' 2"  (1.88 m)     Head Circumference --      Peak Flow --      Pain Score 03/05/20 1425 5     Pain Loc --      Pain Edu? --      Excl. in [place]? --     Constitutional: Alert and oriented. Well appearing and in no acute distress. Normal mental status. Eyes: Conjunctivae are normal. PERRL. Head: Atraumatic. Nose: No congestion/rhinnorhea. Mouth/Throat: Mucous membranes are moist.  Oropharynx non-erythematous. Tongue normal in size and color. Neck: No stridor. No carotid bruit appreciated on exam. Hematological/Lymphatic/Immunilogical: No cervical lymphadenopathy. Cardiovascular: Normal rate, regular rhythm. Grossly normal heart sounds.  Good peripheral circulation. Respiratory: Normal respiratory effort.  No retractions. Lungs CTAB. Gastrointestinal: Soft and nontender. No distention. No abdominal bruits. No CVA tenderness. Genitourinary: Exam deferred. Musculoskeletal: No lower extremity tenderness. No edema of extremities. Neurologic:  Normal speech and language. Decreased sensation to bilateral cheeks.  Skin:  Skin is warm, dry and intact. No rash noted. Psychiatric: Mood and affect are normal. Speech and behavior are normal.  ____________________________________________   LABS (all labs ordered are listed, but only abnormal results are displayed)  Labs Reviewed  BASIC METABOLIC PANEL - Abnormal; Notable for the following components:      Result Value   Glucose, Bld 115 (*)    All other components within normal limits  CBC - Abnormal; Notable for the following components:     nRBC 0.3 (*)    All other components within normal limits  TROPONIN I (HIGH SENSITIVITY)  TROPONIN I (HIGH SENSITIVITY)   ____________________________________________  EKG  ED ECG REPORT I, Braelyn Jenson, FNP-BC personally viewed and interpreted this ECG.   Date: 03/05/2020  EKG Time: 1424  Rate: 87  Rhythm: normal EKG, normal sinus rhythm  Axis: normal  Intervals:none  ST&T Change: no ST elevation  ____________________________________________  RADIOLOGY  ED MD interpretation:  No cardiopulmonary abnormality.  I, Sherrie George, personally viewed and evaluated these images (plain radiographs) as part of my medical decision making, as well as reviewing the written report by the radiologist.  Official radiology report(s): DG Chest 2 View  Result Date: 03/05/2020 CLINICAL DATA:  Chest pain EXAM: CHEST - 2 VIEW COMPARISON:  Mar 20, 2017 FINDINGS: Lungs are clear. Heart size and pulmonary vascularity are normal. No adenopathy. No pneumothorax. No bone lesions. IMPRESSION: No abnormality noted. Electronically Signed   By: Lowella Grip III M.D.   On: 03/05/2020 16:49   CT  Head Wo Contrast  Result Date: 03/05/2020 CLINICAL DATA:  Left-sided chest pain. EXAM: CT HEAD WITHOUT CONTRAST TECHNIQUE: Contiguous axial images were obtained from the base of the skull through the vertex without intravenous contrast. COMPARISON:  None. FINDINGS: Brain: No evidence of acute infarction, hemorrhage, hydrocephalus, extra-axial collection or mass lesion/mass effect. Vascular: No hyperdense vessel or unexpected calcification. Skull: Normal. Negative for fracture or focal lesion. Sinuses/Orbits: No acute finding. Other: None. IMPRESSION: No acute intracranial pathology. Electronically Signed   By: Virgina Norfolk M.D.   On: 03/05/2020 19:10   CT Cervical Spine Wo Contrast  Result Date: 03/05/2020 CLINICAL DATA:  Left-sided chest pain. EXAM: CT CERVICAL SPINE WITHOUT CONTRAST TECHNIQUE:  Multidetector CT imaging of the cervical spine was performed without intravenous contrast. Multiplanar CT image reconstructions were also generated. COMPARISON:  None. FINDINGS: Alignment: Normal. Skull base and vertebrae: No acute fracture. No primary bone lesion or focal pathologic process. Soft tissues and spinal canal: No prevertebral fluid or swelling. No visible canal hematoma. Disc levels: Normal multilevel endplates are seen with normal multilevel intervertebral disc spaces. Upper chest: Negative. Other: None. IMPRESSION: No acute osseous abnormality in the cervical spine. Electronically Signed   By: Virgina Norfolk M.D.   On: 03/05/2020 19:12    ____________________________________________   PROCEDURES  Procedure(s) performed: None  Procedures  Critical Care performed: No  ____________________________________________   INITIAL IMPRESSION / ASSESSMENT AND PLAN / ED COURSE  As part of my medical decision making, I reviewed the following data within the electronic MEDICAL RECORD NUMBER   34 year old male with no previous cardiac history presents to the emergency department for treatment and evaluation of left-sided neck pain, left-sided chest pain, left arm pain, and facial paresthesias.  While awaiting ER room assignment, serial troponins were drawn.  Both are negative.  EKG shows no concern for ST elevation or new bundle branch block.  Chest x-ray is normal.  CBC does show some nucleated red blood cells but is otherwise normal.  Patient does report a history of cervical radiculopathy.  This is most likely the cause of his symptoms and is to follow up with his primary care provider this week. Prescription for meloxicam sent to his pharmacy.  For symptoms that change, worsen, or for new concerns he is to return to the ER . ____________________________________________  Differential diagnosis includes, but not limited to:     FINAL CLINICAL IMPRESSION(S) / ED DIAGNOSES  Final  diagnoses:  Cervical radiculopathy  Atypical chest pain     ED Discharge Orders         Ordered    meloxicam (MOBIC) 15 MG tablet  Daily     03/05/20 1956           Christian Hamilton was evaluated in Emergency Department on 03/05/2020 for the symptoms described in the history of present illness. He was evaluated in the context of the global COVID-19 pandemic, which necessitated consideration that the patient might be at risk for infection with the SARS-CoV-2 virus that causes COVID-19. Institutional protocols and algorithms that pertain to the evaluation of patients at risk for COVID-19 are in a state of rapid change based on information released by regulatory bodies including the CDC and federal and state organizations. These policies and algorithms were followed during the patient's care in the ED.   Note:  This document was prepared using Dragon voice recognition software and may include unintentional dictation errors.   Victorino Dike, FNP 03/05/20 2028    Delman Kitten,  MD 03/06/20 0100

## 2020-03-05 NOTE — Discharge Instructions (Signed)
Please call and schedule a follow up appointment with your primary care provider to have repeat labs in about a week.  Return to the ER for symptoms that change or worsen or new concerns if unable to schedule an appointment.

## 2020-03-05 NOTE — ED Triage Notes (Signed)
Pt to ED via POV, pt states that he is having left sided chest pain tightness. Pt states that this started around 10 am. Pt was able to go to work, pt states that he took a couple of breaks to see if the tightness would ease off but it never did. Pt reports that he is also having pain into his left neck and heaviness in his left arm. Pt reports numbness on both sides of his face, pt states that he noticed the numbness around 11 am. Pt states that he still has numbness on both sides of his face but mostly on the left side. Pt had some diarrhea last night but none today. Pt is in NAD.

## 2020-04-13 ENCOUNTER — Encounter: Payer: Self-pay | Admitting: Family Medicine

## 2020-05-26 ENCOUNTER — Other Ambulatory Visit: Payer: Self-pay | Admitting: Family Medicine

## 2020-05-26 DIAGNOSIS — F900 Attention-deficit hyperactivity disorder, predominantly inattentive type: Secondary | ICD-10-CM

## 2020-05-29 ENCOUNTER — Other Ambulatory Visit: Payer: Self-pay

## 2020-05-29 DIAGNOSIS — F900 Attention-deficit hyperactivity disorder, predominantly inattentive type: Secondary | ICD-10-CM

## 2020-05-30 ENCOUNTER — Other Ambulatory Visit: Payer: Self-pay | Admitting: Family Medicine

## 2020-05-30 DIAGNOSIS — F900 Attention-deficit hyperactivity disorder, predominantly inattentive type: Secondary | ICD-10-CM

## 2020-05-30 NOTE — Telephone Encounter (Signed)
Requested medication (s) are due for refill today: yes  Requested medication (s) are on the active medication list: yes  Last refill:  01/10/2020  Future visit scheduled: yes  Notes to clinic:  this refill cannot be delegated    Requested Prescriptions  Pending Prescriptions Disp Refills   amphetamine-dextroamphetamine (ADDERALL) 15 MG tablet 60 tablet 0    Sig: Take 1 tablet by mouth 2 (two) times daily. Fill 02/09/2020      Not Delegated - Psychiatry:  Stimulants/ADHD Failed - 05/30/2020 12:52 PM      Failed - This refill cannot be delegated      Failed - Urine Drug Screen completed in last 360 days.      Failed - Valid encounter within last 3 months    Recent Outpatient Visits           4 months ago Attention deficit hyperactivity disorder (ADHD), predominantly inattentive type   [facility] Steele Sizer, MD   7 months ago Annual physical exam   [facility] Steele Sizer, MD   1 year ago Attention deficit hyperactivity disorder (ADHD), predominantly inattentive type   [facility] Steele Sizer, MD   1 year ago Attention deficit hyperactivity disorder (ADHD), predominantly inattentive type   [facility] Steele Sizer, MD   1 year ago Attention deficit hyperactivity disorder (ADHD), predominantly inattentive type   [facility] Steele Sizer, MD       Future Appointments             In 1 month Ancil Boozer, Drue Stager, MD [facility]

## 2020-05-30 NOTE — Telephone Encounter (Signed)
Copied from [place] [id number]. Topic: Quick Communication - Rx Refill/Question >> May 30, 2020 12:49 PM Leward Quan A wrote: Medication: amphetamine-dextroamphetamine (ADDERALL) 15 MG tablet  Appt. 07/12/20 1st available   Has the patient contacted their pharmacy? Yes.   (Agent: If no, request that the patient contact the pharmacy for the refill.) (Agent: If yes, when and what did the pharmacy advise?)  Preferred Pharmacy (with phone number or street name): [place], [place]  Phone:  [phone] Fax:  [phone]     Agent: Please be advised that RX refills may take up to 3 business days. We ask that you follow-up with your pharmacy.

## 2020-05-30 NOTE — Telephone Encounter (Signed)
lvm for pt to return call to schedule appt before refills can be given

## 2020-06-01 DIAGNOSIS — Z20822 Contact with and (suspected) exposure to covid-19: Secondary | ICD-10-CM | POA: Diagnosis not present

## 2020-06-01 DIAGNOSIS — Z03818 Encounter for observation for suspected exposure to other biological agents ruled out: Secondary | ICD-10-CM | POA: Diagnosis not present

## 2020-06-01 NOTE — Telephone Encounter (Signed)
Lvm to call and schedule appt for med refill due to controled substance.

## 2020-07-12 ENCOUNTER — Ambulatory Visit: Payer: BC Managed Care – PPO | Admitting: Family Medicine

## 2020-09-18 ENCOUNTER — Telehealth: Payer: Self-pay | Admitting: Family Medicine

## 2020-09-18 DIAGNOSIS — F900 Attention-deficit hyperactivity disorder, predominantly inattentive type: Secondary | ICD-10-CM

## 2020-09-19 NOTE — Telephone Encounter (Signed)
lvm to inform pt that he needs an appt per dr Ancil Boozer for refills

## 2020-09-27 ENCOUNTER — Encounter: Payer: Self-pay | Admitting: Family Medicine

## 2020-10-10 ENCOUNTER — Other Ambulatory Visit: Payer: Self-pay

## 2020-10-10 ENCOUNTER — Ambulatory Visit: Payer: BC Managed Care – PPO | Admitting: Family Medicine

## 2020-10-10 ENCOUNTER — Encounter: Payer: Self-pay | Admitting: Family Medicine

## 2020-10-10 VITALS — BP 138/88 | HR 83 | Temp 98.1°F | Resp 16 | Ht 74.0 in | Wt 283.9 lb

## 2020-10-10 DIAGNOSIS — E559 Vitamin D deficiency, unspecified: Secondary | ICD-10-CM | POA: Diagnosis not present

## 2020-10-10 DIAGNOSIS — R030 Elevated blood-pressure reading, without diagnosis of hypertension: Secondary | ICD-10-CM

## 2020-10-10 DIAGNOSIS — G8929 Other chronic pain: Secondary | ICD-10-CM

## 2020-10-10 DIAGNOSIS — E669 Obesity, unspecified: Secondary | ICD-10-CM | POA: Diagnosis not present

## 2020-10-10 DIAGNOSIS — F900 Attention-deficit hyperactivity disorder, predominantly inattentive type: Secondary | ICD-10-CM | POA: Diagnosis not present

## 2020-10-10 DIAGNOSIS — M25562 Pain in left knee: Secondary | ICD-10-CM

## 2020-10-10 MED ORDER — AMPHETAMINE-DEXTROAMPHETAMINE 15 MG PO TABS: 15.0000 mg | ORAL_TABLET | Freq: Two times a day (BID) | ORAL | 0 refills | Status: DC

## 2020-10-10 NOTE — Progress Notes (Signed)
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 We will resume medication today   Elevated bp : at home he states his bp is well controlled, he states he has been drinking a lot of caffeine to stay awake and focused at work.   Obesity: BMI above 35, he has gained a lot of weight since last visit, discussed life style modification again , he states nothing tastes well since COVID -19 last year and has difficulty eating healthy food   Back pain chronic intermittent: he state no recent problems with his back, he is stretching when he gets to work and is not having to take any medication  Left knee pain and instability: seen by Ortho in the past, was given meloxicam without help. He states he has a meniscal tear, sometimes is swells and sometimes it locks. 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   Obesity (BMI  30-39.9) 08/08/2008    Past Surgical History:  Procedure Laterality Date   COLONOSCOPY WITH PROPOFOL N/A 07/23/2018   Procedure: COLONOSCOPY WITH PROPOFOL;  Surgeon: Lin Landsman, MD;  Location: [place];  Service: Gastroenterology;  Laterality: N/A;   LEG SURGERY     SHOULDER SURGERY     TONSILECTOMY, ADENOIDECTOMY, BILATERAL MYRINGOTOMY AND TUBES     tubes in ears      Family History  Problem Relation Age of Onset   Kidney disease Father    ADD / ADHD Brother     Social History   Tobacco Use   Smoking status: Never Smoker   Smokeless tobacco: Never Used  Substance Use Topics   Alcohol use: Yes    Alcohol/week: 6.0 standard drinks    Types: 6 Cans of beer per week     Current Outpatient Medications:    amphetamine-dextroamphetamine (ADDERALL) 15 MG tablet, Take 1 tablet by mouth 2 (two) times daily with a meal. 03/06/2020, Disp: 60 tablet, Rfl: 0   amphetamine-dextroamphetamine (ADDERALL) 15 MG tablet, Take 1 tablet by mouth 2 (two) times daily. Fill 02/09/2020, Disp: 60 tablet, Rfl: 0   amphetamine-dextroamphetamine (ADDERALL) 15 MG tablet, Take 1 tablet by mouth 2 (two) times daily., Disp: 60 tablet, Rfl: 0   cyclobenzaprine (FLEXERIL) 10 MG tablet, Take 1 tablet (10 mg total) by mouth at bedtime. (Patient not taking: Reported on  10/10/2020), Disp: 30 tablet, Rfl: 0   meloxicam (MOBIC) 15 MG tablet, Take 1 tablet (15 mg total) by mouth daily. (Patient not taking: Reported on 10/10/2020), Disp: 30 tablet, Rfl: 0  No Known Allergies  I personally reviewed active problem list, medication list, allergies, family history, social history, health maintenance with the patient/caregiver today.   ROS  Ten systems reviewed and is negative except as mentioned in HPI   Objective  Vitals:   10/10/20 1137  BP: 138/88  Pulse: 83  Resp: 16  Temp: 98.1 F (36.7 C)  TempSrc: Oral  SpO2: 99%  Weight: 283 lb 14.4 oz (128.8 kg)  Height: 6\' 2"  (1.88  m)    Body mass index is 36.45 kg/m.  Physical Exam  Constitutional: Patient appears well-developed and well-nourished. Obese  No distress.  HEENT: head atraumatic, normocephalic, pupils equal and reactive to light,  neck supple Cardiovascular: Normal rate, regular rhythm and normal heart sounds.  No murmur heard. No BLE edema. Pulmonary/Chest: Effort normal and breath sounds normal. No respiratory distress. Abdominal: Soft.  There is no tenderness. Psychiatric: Patient has a normal mood and affect. behavior is normal. Judgment and thought content normal.  PHQ2/9: Depression screen [facility] 2/9 10/10/2020 01/10/2020 10/11/2019 10/11/2019 03/30/2019  Decreased Interest 0 0 0 0 0  Down, Depressed, Hopeless 0 0 0 0 0  PHQ - 2 Score 0 0 0 0 0  Altered sleeping - 0 2 0 0  Tired, decreased energy - 0 2 0 0  Change in appetite - 0 2 0 0  Feeling bad or failure about yourself  - 0 0 0 0  Trouble concentrating - 0 0 0 0  Moving slowly or fidgety/restless - 0 0 0 0  Suicidal thoughts - 0 0 - 0  PHQ-9 Score - 0 6 0 0  Difficult doing work/chores - - Somewhat difficult - Not difficult at all  Some recent data might be hidden    phq 9 is negative   Fall Risk: Fall Risk  10/10/2020 01/10/2020 10/11/2019 03/30/2019 01/15/2019  Falls in the past year? 0 0 0 0 0  Number falls in past yr: 0 0 0 0 0  Injury with Fall? 0 0 0 0 0    Functional Status Survey: Is the patient deaf or have difficulty hearing?: No Does the patient have difficulty seeing, even when wearing glasses/contacts?: No Does the patient have difficulty concentrating, remembering, or making decisions?: Yes Does the patient have difficulty walking or climbing stairs?: No Does the patient have difficulty dressing or bathing?: No Does the patient have difficulty doing errands alone such as visiting a doctor's office or shopping?: No    Assessment & Plan  1. Attention deficit hyperactivity disorder (ADHD), predominantly inattentive  type  - amphetamine-dextroamphetamine (ADDERALL) 15 MG tablet; Take 1 tablet by mouth 2 (two) times daily with a meal. 12/09/2020  Dispense: 60 tablet; Refill: 0 - amphetamine-dextroamphetamine (ADDERALL) 15 MG tablet; Take 1 tablet by mouth 2 (two) times daily. Fill 11/09/2020  Dispense: 60 tablet; Refill: 0 - amphetamine-dextroamphetamine (ADDERALL) 15 MG tablet; Take 1 tablet by mouth 2 (two) times daily.  Dispense: 60 tablet; Refill: 0   2. Obesity (BMI 30-39.9)  Discussed with the patient the risk posed by an increased BMI. Discussed importance of portion control, calorie counting and at least 150 minutes of physical activity weekly. Avoid sweet beverages and drink more water. Eat at least 6 servings of fruit and vegetables daily   3. Vitamin D  deficiency  Resume supplementation   4. Elevated blood pressure reading  Continue to monitor   5. Chronic pain of left knee  Follow up with Ortho

## 2020-11-04 ENCOUNTER — Other Ambulatory Visit: Payer: BC Managed Care – PPO

## 2020-11-04 DIAGNOSIS — Z20822 Contact with and (suspected) exposure to covid-19: Secondary | ICD-10-CM

## 2020-11-07 LAB — NOVEL CORONAVIRUS, NAA: SARS-CoV-2, NAA: NOT DETECTED

## 2020-11-10 ENCOUNTER — Ambulatory Visit: Payer: BC Managed Care – PPO | Admitting: Family Medicine

## 2020-12-19 ENCOUNTER — Encounter: Payer: BC Managed Care – PPO | Admitting: Family Medicine

## 2021-02-15 ENCOUNTER — Encounter: Payer: Self-pay | Admitting: Family Medicine

## 2021-02-16 NOTE — Progress Notes (Signed)
Name: Christian Hamilton   MRN: [id number]    DOB: 04/03/1986   Date:02/19/2021       Progress Note  Subjective  Chief Complaint  Medication Refill  HPI  ADHD: hewas onVyvanse but stopped working and also too expensive, we switched toAdderal XR back in August 2019 and was  doing well on Adderal XR 25 mg in am however it was keeping him up at night, we switched to Adderal 15 mg BID and was doing well on medication, ran out of medication three days ago and really felt the difference without being without medication. Difficulty focusing, first day without medication made him crash after that focus problems. He states two daily still does not last as long. He had another schedule shift change, he gets up around 2:30-3 am and works between 10-12 hours per day and is going to bed around 9 pm. He would like to try Adderal XR 25 again since he is working longer hours.   Obesity: BMI above 35, he has gained a lot of weight since last visit, discussed life style modification again , he states nothing tastes well since COVID -19 last year and has difficulty eating healthy food , craving french fries, he also skips meals but will try to resume protein shakes   Left knee pain and instability: seen by Ortho in the past, was given meloxicam without help. He states he has a meniscal tear, sometimes is swells and sometimes it locks.He stopped meloxicam, not ready to have surgery   Patient Active Problem List   Diagnosis Date Noted  . DDD (degenerative disc disease), cervical 11/04/2017  . Varicocele 07/25/2016  . Neck pain 10/12/2015  . Panic attack as reaction to stress 06/02/2015  . ADD (attention deficit disorder) 05/30/2015  . Back pain, chronic 05/30/2015  . Chronic venous insufficiency 05/30/2015  . Abnormal antinuclear antibody titer 05/30/2015  . Migraine without aura and responsive to treatment 05/10/2009  . 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 Obesity (BMI 30-39.9) 08/08/2008     Past Surgical History:  Procedure Laterality Date  . COLONOSCOPY WITH PROPOFOL N/A 07/23/2018   Procedure: COLONOSCOPY WITH PROPOFOL;  Surgeon: Lin Landsman, MD;  Location: [facility] ENDOSCOPY;  Service: Gastroenterology;  Laterality: N/A;  . LEG SURGERY    . SHOULDER SURGERY    . TONSILECTOMY, ADENOIDECTOMY, BILATERAL MYRINGOTOMY AND TUBES    . tubes in ears      Family History  Problem Relation Age of Onset  . Kidney disease Father   . ADD / ADHD Brother     Social History   Tobacco Use  . Smoking status: Never Smoker  . Smokeless tobacco: Never Used  Substance Use Topics  . Alcohol use: Yes    Alcohol/week: 6.0 standard drinks    Types: 6 Cans of beer per week     Current Outpatient Medications:  .  amphetamine-dextroamphetamine (ADDERALL) 15 MG tablet, Take 1 tablet by mouth 2 (two) times daily with a meal. 12/09/2020, Disp: 60 tablet, Rfl: 0 .  amphetamine-dextroamphetamine (ADDERALL) 15 MG tablet, Take 1 tablet by mouth 2 (two) times daily. Fill 11/09/2020, Disp: 60 tablet, Rfl: 0 .  amphetamine-dextroamphetamine (ADDERALL) 15 MG tablet, Take 1 tablet by mouth 2 (two) times daily., Disp: 60 tablet, Rfl: 0 .  meloxicam (MOBIC) 15 MG tablet, Take 1 tablet (15 mg total) by mouth daily. (Patient not taking: No sig reported), Disp: 30 tablet, Rfl: 0  No Known Allergies  I personally reviewed active problem  list, medication list, allergies, family history, social history, health maintenance with the patient/caregiver today.   ROS  Constitutional: Negative for fever or weight change.  Respiratory: Negative for cough and shortness of breath.   Cardiovascular: Negative for chest pain or palpitations.  Gastrointestinal: Negative for abdominal pain, no bowel changes.  Musculoskeletal: Negative for gait problem or joint swelling.  Skin: Negative for rash.  Neurological: Negative for dizziness or headache.  No other specific complaints in a complete review of systems  (except as listed in HPI above).  Objective  Vitals:   02/19/21 1147  BP: 128/82  Pulse: 85  Resp: 16  Temp: 98.1 F (36.7 C)  TempSrc: Oral  SpO2: 98%  Weight: 283 lb (128.4 kg)  Height: 6\' 2"  (1.88 m)    Body mass index is 36.34 kg/m.  Physical Exam  Constitutional: Patient appears well-developed and well-nourished. Obese No distress.  HEENT: head atraumatic, normocephalic, pupils equal and reactive to light, neck supple Cardiovascular: Normal rate, regular rhythm and normal heart sounds.  No murmur heard. No BLE edema. Pulmonary/Chest: Effort normal and breath sounds normal. No respiratory distress. Abdominal: Soft.  There is no tenderness. Psychiatric: Patient has a normal mood and affect. behavior is normal. Judgment and thought content normal.  PHQ2/9: Depression screen [facility] 2/9 02/19/2021 10/10/2020 01/10/2020 10/11/2019 10/11/2019  Decreased Interest 0 0 0 0 0  Down, Depressed, Hopeless 0 0 0 0 0  PHQ - 2 Score 0 0 0 0 0  Altered sleeping - - 0 2 0  Tired, decreased energy - - 0 2 0  Change in appetite - - 0 2 0  Feeling bad or failure about yourself  - - 0 0 0  Trouble concentrating - - 0 0 0  Moving slowly or fidgety/restless - - 0 0 0  Suicidal thoughts - - 0 0 -  PHQ-9 Score - - 0 6 0  Difficult doing work/chores - - - Somewhat difficult -  Some recent data might be hidden    phq 9 is negative   Fall Risk: Fall Risk  02/19/2021 10/10/2020 01/10/2020 10/11/2019 03/30/2019  Falls in the past year? 0 0 0 0 0  Number falls in past yr: 0 0 0 0 0  Injury with Fall? 0 0 0 0 0    Functional Status Survey: Is the patient deaf or have difficulty hearing?: No Does the patient have difficulty seeing, even when wearing glasses/contacts?: No Does the patient have difficulty concentrating, remembering, or making decisions?: No Does the patient have difficulty walking or climbing stairs?: No Does the patient have difficulty dressing or bathing?: No Does the patient  have difficulty doing errands alone such as visiting a doctor's office or shopping?: No    Assessment & Plan  1. Attention deficit hyperactivity disorder (ADHD), predominantly inattentive type  - amphetamine-dextroamphetamine (ADDERALL XR) 25 MG 24 hr capsule; Take 1 capsule by mouth every morning.  Dispense: 30 capsule; Refill: 0 - amphetamine-dextroamphetamine (ADDERALL XR) 25 MG 24 hr capsule; Take 1 capsule by mouth every morning. Fill May 24 , 2022  Dispense: 30 capsule; Refill: 0 - amphetamine-dextroamphetamine (ADDERALL XR) 25 MG 24 hr capsule; Take 1 capsule by mouth every morning.  Dispense: 30 capsule; Refill: 0  2. Obesity (BMI 30-39.9)  Discussed with the patient the risk posed by an increased BMI. Discussed importance of portion control, calorie counting and at least 150 minutes of physical activity weekly. Avoid sweet beverages and drink more water. Eat at least 6  servings of fruit and vegetables daily   3. Vitamin D deficiency  Advised him to resume Vitamin D supplementation   4. Chronic pain of left knee

## 2021-02-19 ENCOUNTER — Other Ambulatory Visit: Payer: Self-pay

## 2021-02-19 ENCOUNTER — Encounter: Payer: Self-pay | Admitting: Family Medicine

## 2021-02-19 ENCOUNTER — Ambulatory Visit: Payer: BC Managed Care – PPO | Admitting: Family Medicine

## 2021-02-19 VITALS — BP 128/82 | HR 85 | Temp 98.1°F | Resp 16 | Ht 74.0 in | Wt 283.0 lb

## 2021-02-19 DIAGNOSIS — E669 Obesity, unspecified: Secondary | ICD-10-CM

## 2021-02-19 DIAGNOSIS — G8929 Other chronic pain: Secondary | ICD-10-CM

## 2021-02-19 DIAGNOSIS — M25562 Pain in left knee: Secondary | ICD-10-CM | POA: Diagnosis not present

## 2021-02-19 DIAGNOSIS — E559 Vitamin D deficiency, unspecified: Secondary | ICD-10-CM

## 2021-02-19 DIAGNOSIS — F900 Attention-deficit hyperactivity disorder, predominantly inattentive type: Secondary | ICD-10-CM | POA: Diagnosis not present

## 2021-02-19 MED ORDER — AMPHETAMINE-DEXTROAMPHET ER 25 MG PO CP24: 25.0000 mg | ORAL_CAPSULE | ORAL | 0 refills | Status: DC

## 2021-04-18 DIAGNOSIS — D3122 Benign neoplasm of left retina: Secondary | ICD-10-CM | POA: Diagnosis not present

## 2021-04-18 DIAGNOSIS — D3121 Benign neoplasm of right retina: Secondary | ICD-10-CM | POA: Diagnosis not present

## 2021-05-15 NOTE — Progress Notes (Deleted)
Name: Christian Hamilton   MRN: [id number]    DOB: September 06, 1986   Date:05/15/2021       Progress Note  Subjective  Chief Complaint  Medication Refill  HPI  ADHD: he was on  Vyvanse but stopped working and also too expensive, we switched to  Adderal XR back in August 2019 and was  doing well on Adderal XR 25 mg in am however it was keeping him up at night, we switched to Adderal 15 mg BID and was doing well on medication, ran out of medication three days ago and really felt the difference without being without medication. Difficulty focusing, first day without medication made him crash after that focus problems. He states two daily still does not last as long. He had another schedule shift change, he gets up around 2:30-3 am and works between 10-12 hours per day and is going to bed around 9 pm. He would like to try Adderal XR 25 again since he is working longer hours.   Obesity: BMI above 35, he has gained a lot of weight since last visit, discussed life style modification again , he states nothing tastes well since COVID -19 last year and has difficulty eating healthy food , craving french fries, he also skips meals but will try to resume protein shakes   Left knee pain and instability: seen by Ortho in the past, was given meloxicam without help. He states he has a meniscal tear, sometimes is swells and sometimes it locks.He stopped meloxicam, not ready to have surgery   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   Obesity (BMI 30-39.9) 08/08/2008    Past  Surgical History:  Procedure Laterality Date   COLONOSCOPY WITH PROPOFOL N/A 07/23/2018   Procedure: COLONOSCOPY WITH PROPOFOL;  Surgeon: Lin Landsman, MD;  Location: [facility];  Service: Gastroenterology;  Laterality: N/A;   LEG SURGERY     SHOULDER SURGERY     TONSILECTOMY, ADENOIDECTOMY, BILATERAL MYRINGOTOMY AND TUBES     tubes in ears      Family History  Problem Relation Age of Onset   Kidney disease Father    ADD / ADHD Brother     Social History   Tobacco Use   Smoking status: Never   Smokeless tobacco: Never  Substance Use Topics   Alcohol use: Yes    Alcohol/week: 6.0 standard drinks    Types: 6 Cans of beer per week     Current Outpatient Medications:    amphetamine-dextroamphetamine (ADDERALL XR) 25 MG 24 hr capsule, Take 1 capsule by mouth every morning., Disp: 30 capsule, Rfl: 0   amphetamine-dextroamphetamine (ADDERALL XR) 25 MG 24 hr capsule, Take 1 capsule by mouth every morning. Fill May 24 , 2022, Disp: 30 capsule, Rfl: 0   amphetamine-dextroamphetamine (ADDERALL XR) 25 MG 24 hr capsule, Take 1 capsule by mouth every morning., Disp: 30 capsule, Rfl: 0  No Known Allergies  I personally reviewed {Reviewed:[id number]} with the patient/caregiver today.   ROS  ***  Objective  There were no vitals filed for this visit.  There is  no height or weight on file to calculate BMI.  Physical Exam ***  No results found for this or any previous visit (from the past 2160 hour(s)).  Diabetic Foot Exam: Diabetic Foot Exam - Simple   No data filed    ***  PHQ2/9: Depression screen [facility] 2/9 02/19/2021 10/10/2020 01/10/2020 10/11/2019 10/11/2019  Decreased Interest 0 0 0 0 0  Down, Depressed, Hopeless 0 0 0 0 0  PHQ - 2 Score 0 0 0 0 0  Altered sleeping - - 0 2 0  Tired, decreased energy - - 0 2 0  Change in appetite - - 0 2 0  Feeling bad or failure about yourself  - - 0 0 0  Trouble concentrating - - 0 0 0  Moving slowly or fidgety/restless - -  0 0 0  Suicidal thoughts - - 0 0 -  PHQ-9 Score - - 0 6 0  Difficult doing work/chores - - - Somewhat difficult -  Some recent data might be hidden    phq 9 is {gen pos [id number]} ***  Fall Risk: Fall Risk  02/19/2021 10/10/2020 01/10/2020 10/11/2019 03/30/2019  Falls in the past year? 0 0 0 0 0  Number falls in past yr: 0 0 0 0 0  Injury with Fall? 0 0 0 0 0   ***   Functional Status Survey:   ***   Assessment & Plan  *** There are no diagnoses linked to this encounter.

## 2021-05-16 ENCOUNTER — Ambulatory Visit: Payer: BC Managed Care – PPO | Admitting: Family Medicine

## 2021-05-17 ENCOUNTER — Encounter: Payer: Self-pay | Admitting: Unknown Physician Specialty

## 2021-05-17 ENCOUNTER — Ambulatory Visit: Payer: BC Managed Care – PPO | Admitting: Unknown Physician Specialty

## 2021-05-17 ENCOUNTER — Other Ambulatory Visit: Payer: Self-pay

## 2021-05-17 DIAGNOSIS — F900 Attention-deficit hyperactivity disorder, predominantly inattentive type: Secondary | ICD-10-CM

## 2021-05-17 MED ORDER — AMPHETAMINE-DEXTROAMPHET ER 25 MG PO CP24: 25.0000 mg | ORAL_CAPSULE | ORAL | 0 refills | Status: DC

## 2021-05-17 NOTE — Progress Notes (Signed)
BP 118/76   Pulse 98   Temp 98.2 F (36.8 C) (Oral)   Resp 18   Ht 6\' 2"  (1.88 m)   Wt 272 lb 8 oz (123.6 kg)   SpO2 99%   BMI 34.99 kg/m    Subjective:    Patient ID: Christian Hamilton, male    DOB: 1986-08-15, 35 y.o.   MRN: [id number]  HPI: Christian Hamilton is a 35 y.o. male  Chief Complaint  Patient presents with   ADHD    Medication refills   Pt states that he was prescribed Adderall by Dr Ancil Boozer due to inability to focus and tendency to "drift." Takes the Adderall XR 25 nmg daily.  No weight loss.  He does admit to anxiety attacks in the last 2 weeks in which he felt his chest was "closing up."  He used to get them more frequently.  At one time had Xanax at one time.  The time he remembers he forgot his stimulant.    Depression screen [facility] 2/9 05/17/2021 02/19/2021 10/10/2020 01/10/2020 10/11/2019  Decreased Interest 0 0 0 0 0  Down, Depressed, Hopeless 0 0 0 0 0  PHQ - 2 Score 0 0 0 0 0  Altered sleeping - - - 0 2  Tired, decreased energy - - - 0 2  Change in appetite - - - 0 2  Feeling bad or failure about yourself  - - - 0 0  Trouble concentrating - - - 0 0  Moving slowly or fidgety/restless - - - 0 0  Suicidal thoughts - - - 0 0  PHQ-9 Score - - - 0 6  Difficult doing work/chores - - - - Somewhat difficult  Some recent data might be hidden    Relevant past medical, surgical, family and social history reviewed and updated as indicated. Interim medical history since our last visit reviewed. Allergies and medications reviewed and updated.  Review of Systems  Per HPI unless specifically indicated above     Objective:    BP 118/76   Pulse 98   Temp 98.2 F (36.8 C) (Oral)   Resp 18   Ht 6\' 2"  (1.88 m)   Wt 272 lb 8 oz (123.6 kg)   SpO2 99%   BMI 34.99 kg/m   Wt Readings from Last 3 Encounters:  05/17/21 272 lb 8 oz (123.6 kg)  02/19/21 283 lb (128.4 kg)  10/10/20 283 lb 14.4 oz (128.8 kg)    Physical Exam Constitutional:      General: He is not in acute  distress.    Appearance: Normal appearance. He is well-developed.  HENT:     Head: Normocephalic and atraumatic.  Eyes:     General: Lids are normal. No scleral icterus.       Right eye: No discharge.        Left eye: No discharge.     Conjunctiva/sclera: Conjunctivae normal.  Neck:     Vascular: No carotid bruit or JVD.  Cardiovascular:     Rate and Rhythm: Normal rate and regular rhythm.     Heart sounds: Normal heart sounds.  Pulmonary:     Effort: Pulmonary effort is normal. No respiratory distress.     Breath sounds: Normal breath sounds.  Abdominal:     Palpations: There is no hepatomegaly or splenomegaly.  Musculoskeletal:        General: Normal range of motion.     Cervical back: Normal range of motion and neck supple.  Skin:    General: Skin is warm and dry.     Coloration: Skin is not pale.     Findings: No rash.  Neurological:     Mental Status: He is alert and oriented to person, place, and time.  Psychiatric:        Behavior: Behavior normal.        Thought Content: Thought content normal.        Judgment: Judgment normal.    Results for orders placed or performed in visit on 11/04/20  Novel Coronavirus, NAA ([facility])   Specimen: Nasopharyngeal(NP) swabs in vial transport medium   Nasopharynge  Result Value Ref Range   SARS-CoV-2, NAA Not Detected Not Detected      Assessment & Plan:   Problem List Items Addressed This Visit       Unprioritized   ADD (attention deficit disorder)    Refilled medications today.  Had a day of panic attacks.  Discussed Xanax would not be our first line medicaiton.  ? If ADHD meds should be adjusted.  Will continue to monitor       Relevant Medications   amphetamine-dextroamphetamine (ADDERALL XR) 25 MG 24 hr capsule   amphetamine-dextroamphetamine (ADDERALL XR) 25 MG 24 hr capsule   amphetamine-dextroamphetamine (ADDERALL XR) 25 MG 24 hr capsule     Follow up plan: Return in about 3 months (around  08/17/2021).

## 2021-05-17 NOTE — Assessment & Plan Note (Signed)
Refilled medications today.  Had a day of panic attacks.  Discussed Xanax would not be our first line medicaiton.  ? If ADHD meds should be adjusted.  Will continue to monitor

## 2021-05-31 IMAGING — CT CT HEAD W/O CM
4 series · 16 of 47 positions shown, 18 images · non-contrast
Comparison: None.

CLINICAL DATA: Left-sided chest pain.

EXAM:
CT HEAD WITHOUT CONTRAST
TECHNIQUE: Contiguous axial images were obtained from the base of the skull
through the vertex without intravenous contrast.

[Series 2: head wo · axial · 0.42mm/px · z∈[+246,+361]mm · 7 of 31 slices shown, 9 images]
[im 4/31  brain]
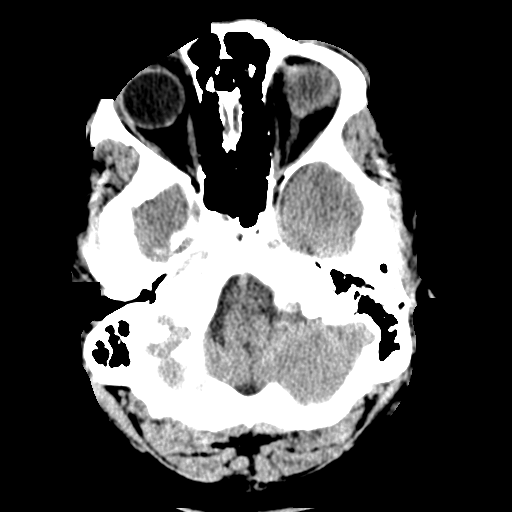
[im 4/31  bone]
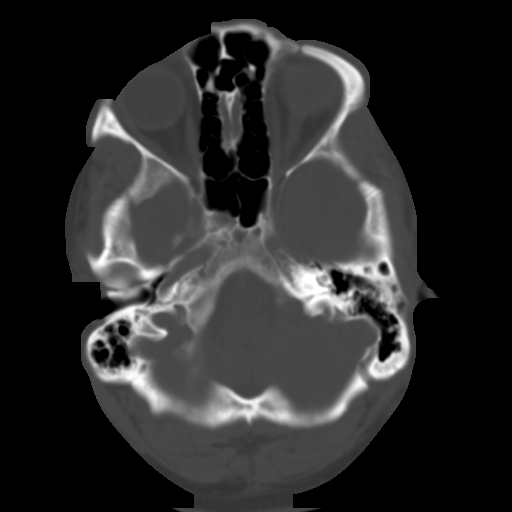
[im 8/31  brain]
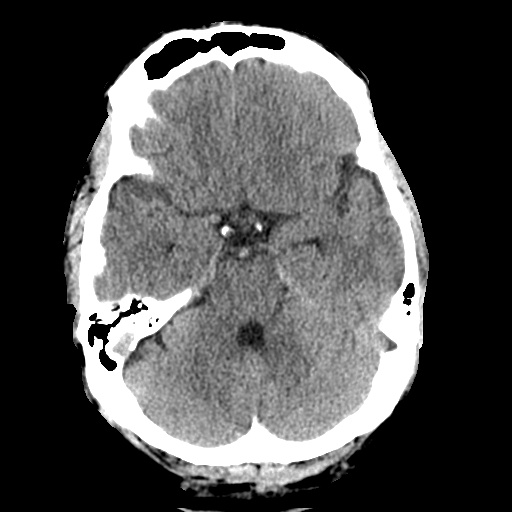
[im 12/31  brain]
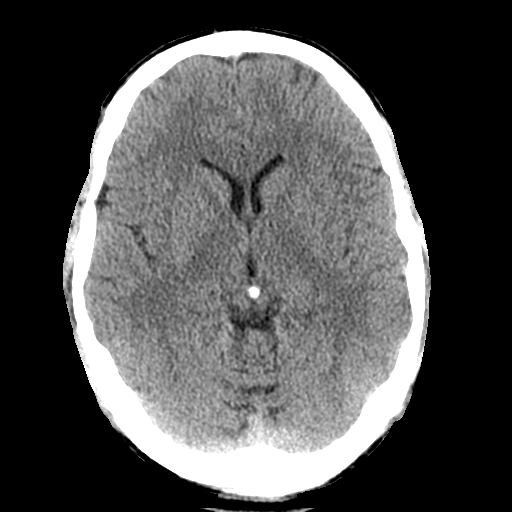
[im 16/31  brain]
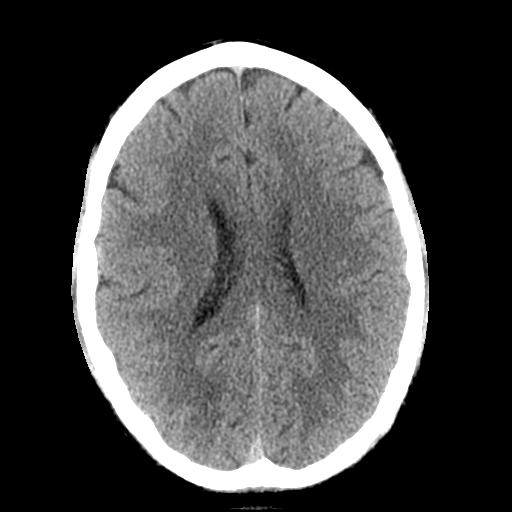
[im 19/31  brain]
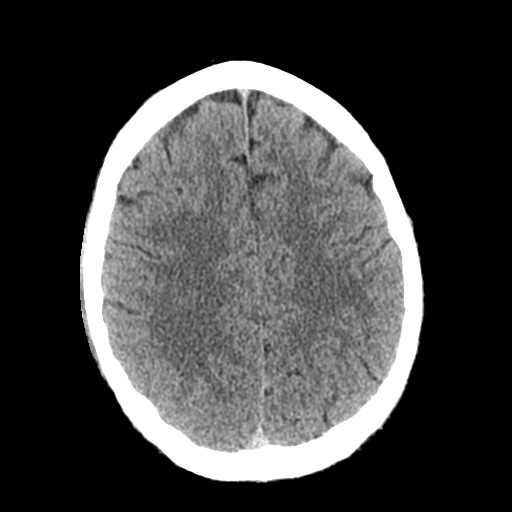
[im 19/31  bone]
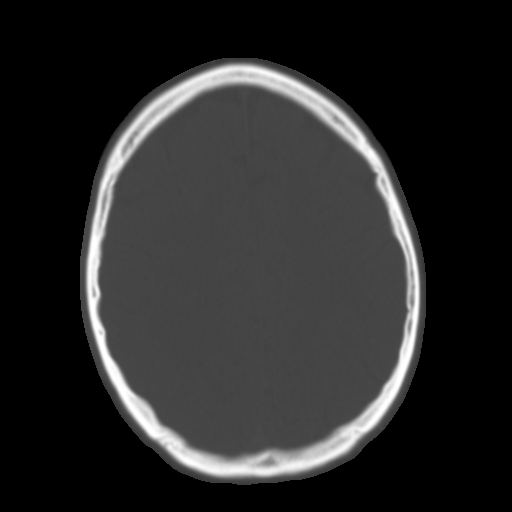
[im 23/31  brain]
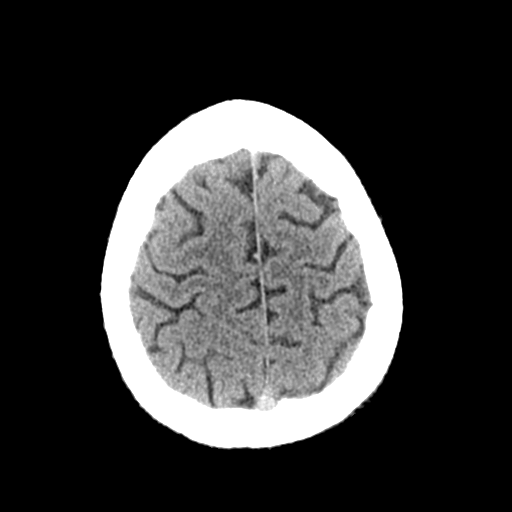
[im 27/31  brain]
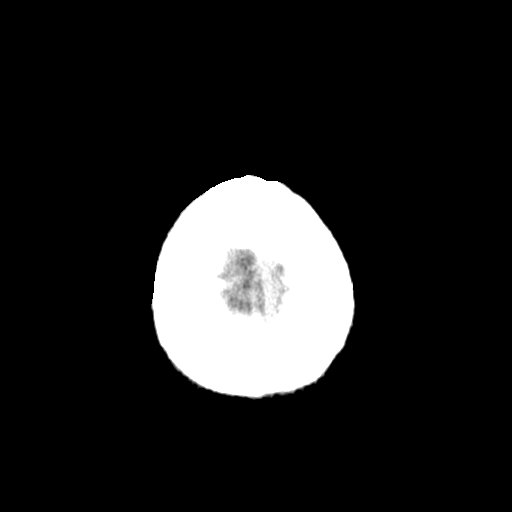

[Series 3: head bone · axial · 0.42mm/px · z∈[+245,+277]mm · 3 of 78 slices shown]
[im 8/78  bone]
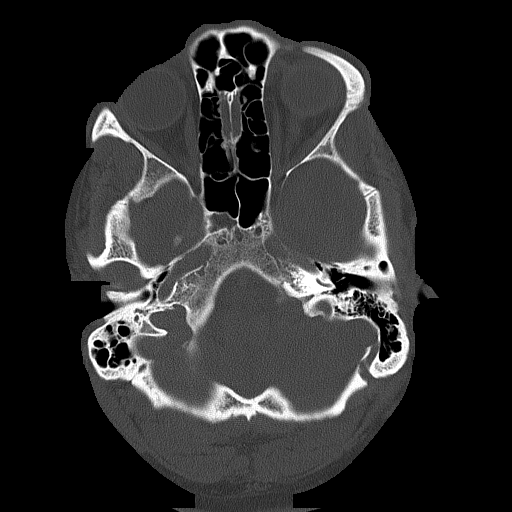
[im 16/78  bone]
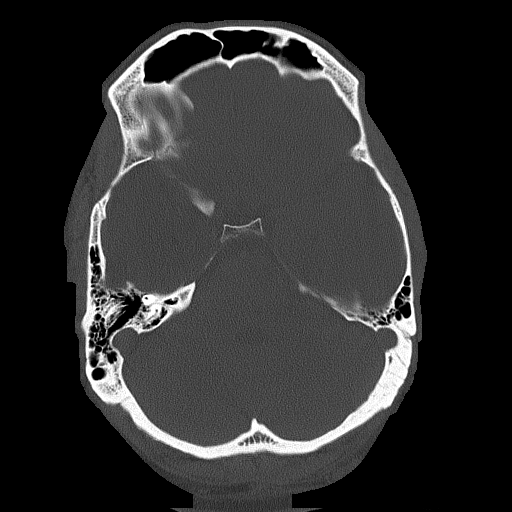
[im 24/78  bone]
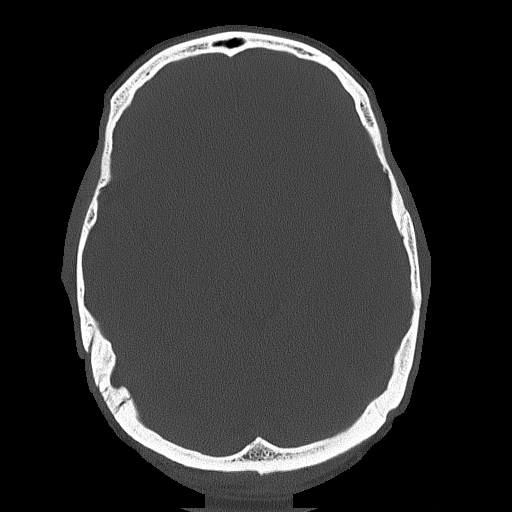

[Series 4: coronal soft tissue · coronal · 0.30mm/px · 3 of 71 slices shown]
[im 24/71  brain]
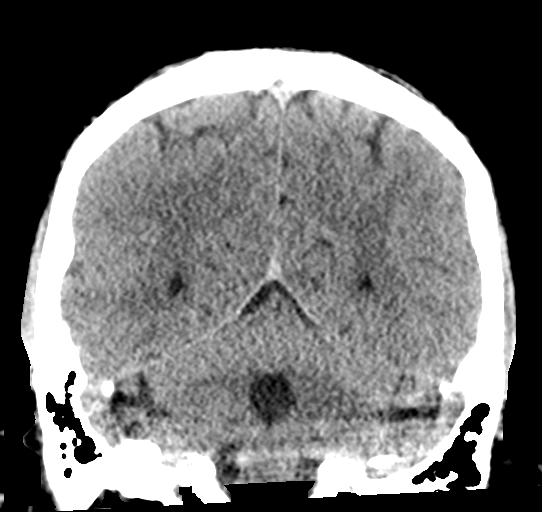
[im 32/71  brain]
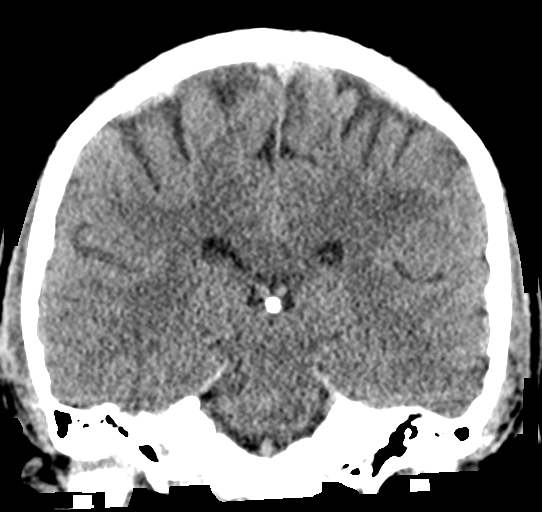
[im 39/71  brain]
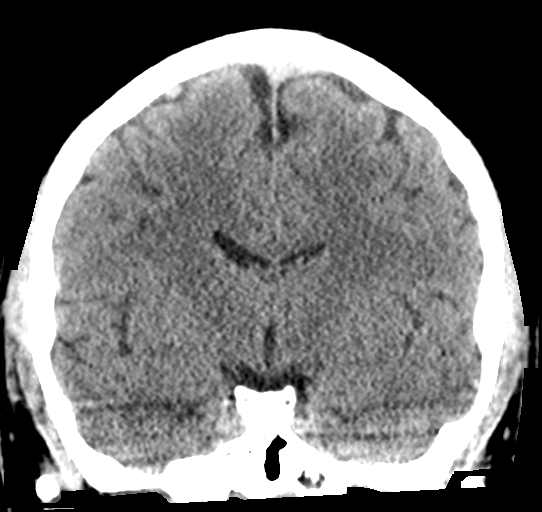

[Series 5: sagittal soft tissue · sagittal · 0.30mm/px · 3 of 55 slices shown]
[im 19/55  brain]
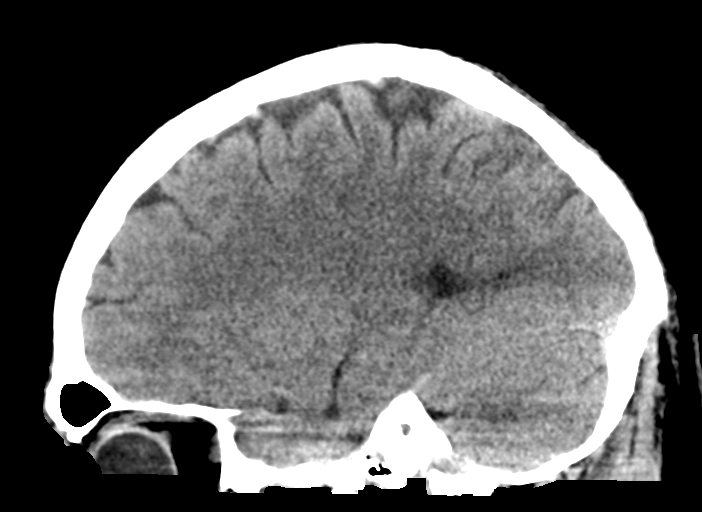
[im 28/55  brain]
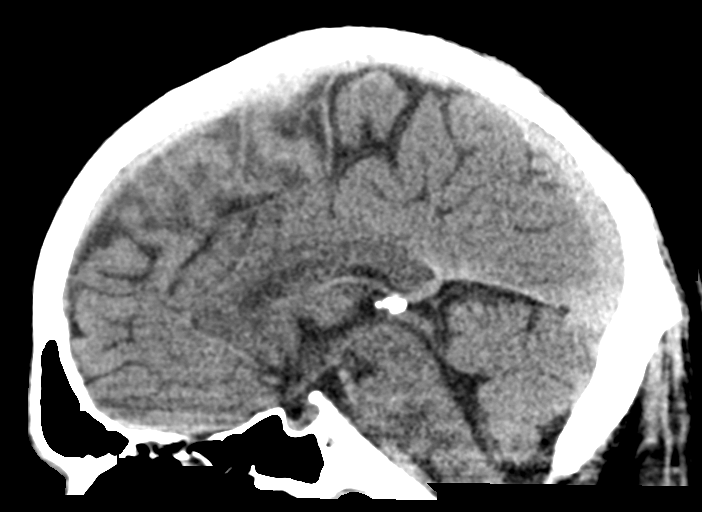
[im 37/55  brain]
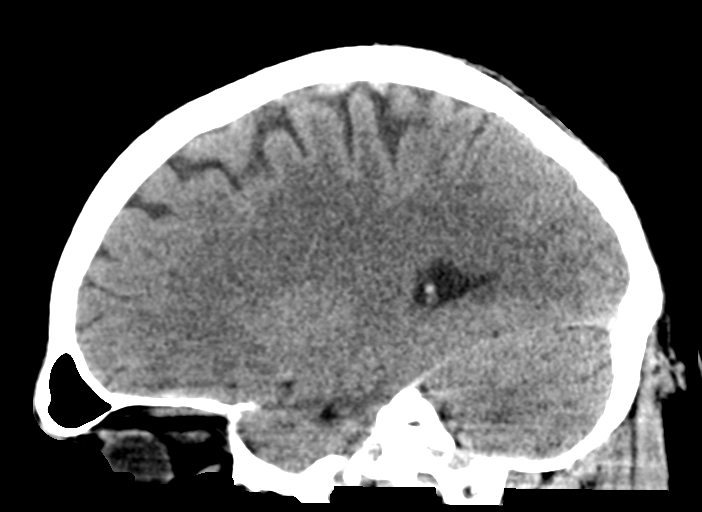

[16 of 47 positions shown; findings below may reference images not displayed]

FINDINGS: Brain: No evidence of acute infarction, hemorrhage, hydrocephalus,
extra-axial collection or mass lesion/mass effect.

Vascular: No hyperdense vessel or unexpected calcification.

Skull: Normal. Negative for fracture or focal lesion.

Sinuses/Orbits: No acute finding.

Other: None.
IMPRESSION: No acute intracranial pathology.

## 2021-05-31 IMAGING — CR DG CHEST 2V
1 series · 2 of 2 positions shown · non-contrast
Comparison: March 20, 2017

CLINICAL DATA: Chest pain

EXAM:
CHEST - 2 VIEW

[Series 1: dg chest 2 view · 0.14mm/px · 2 of 2 slices shown]
[im 1/2]
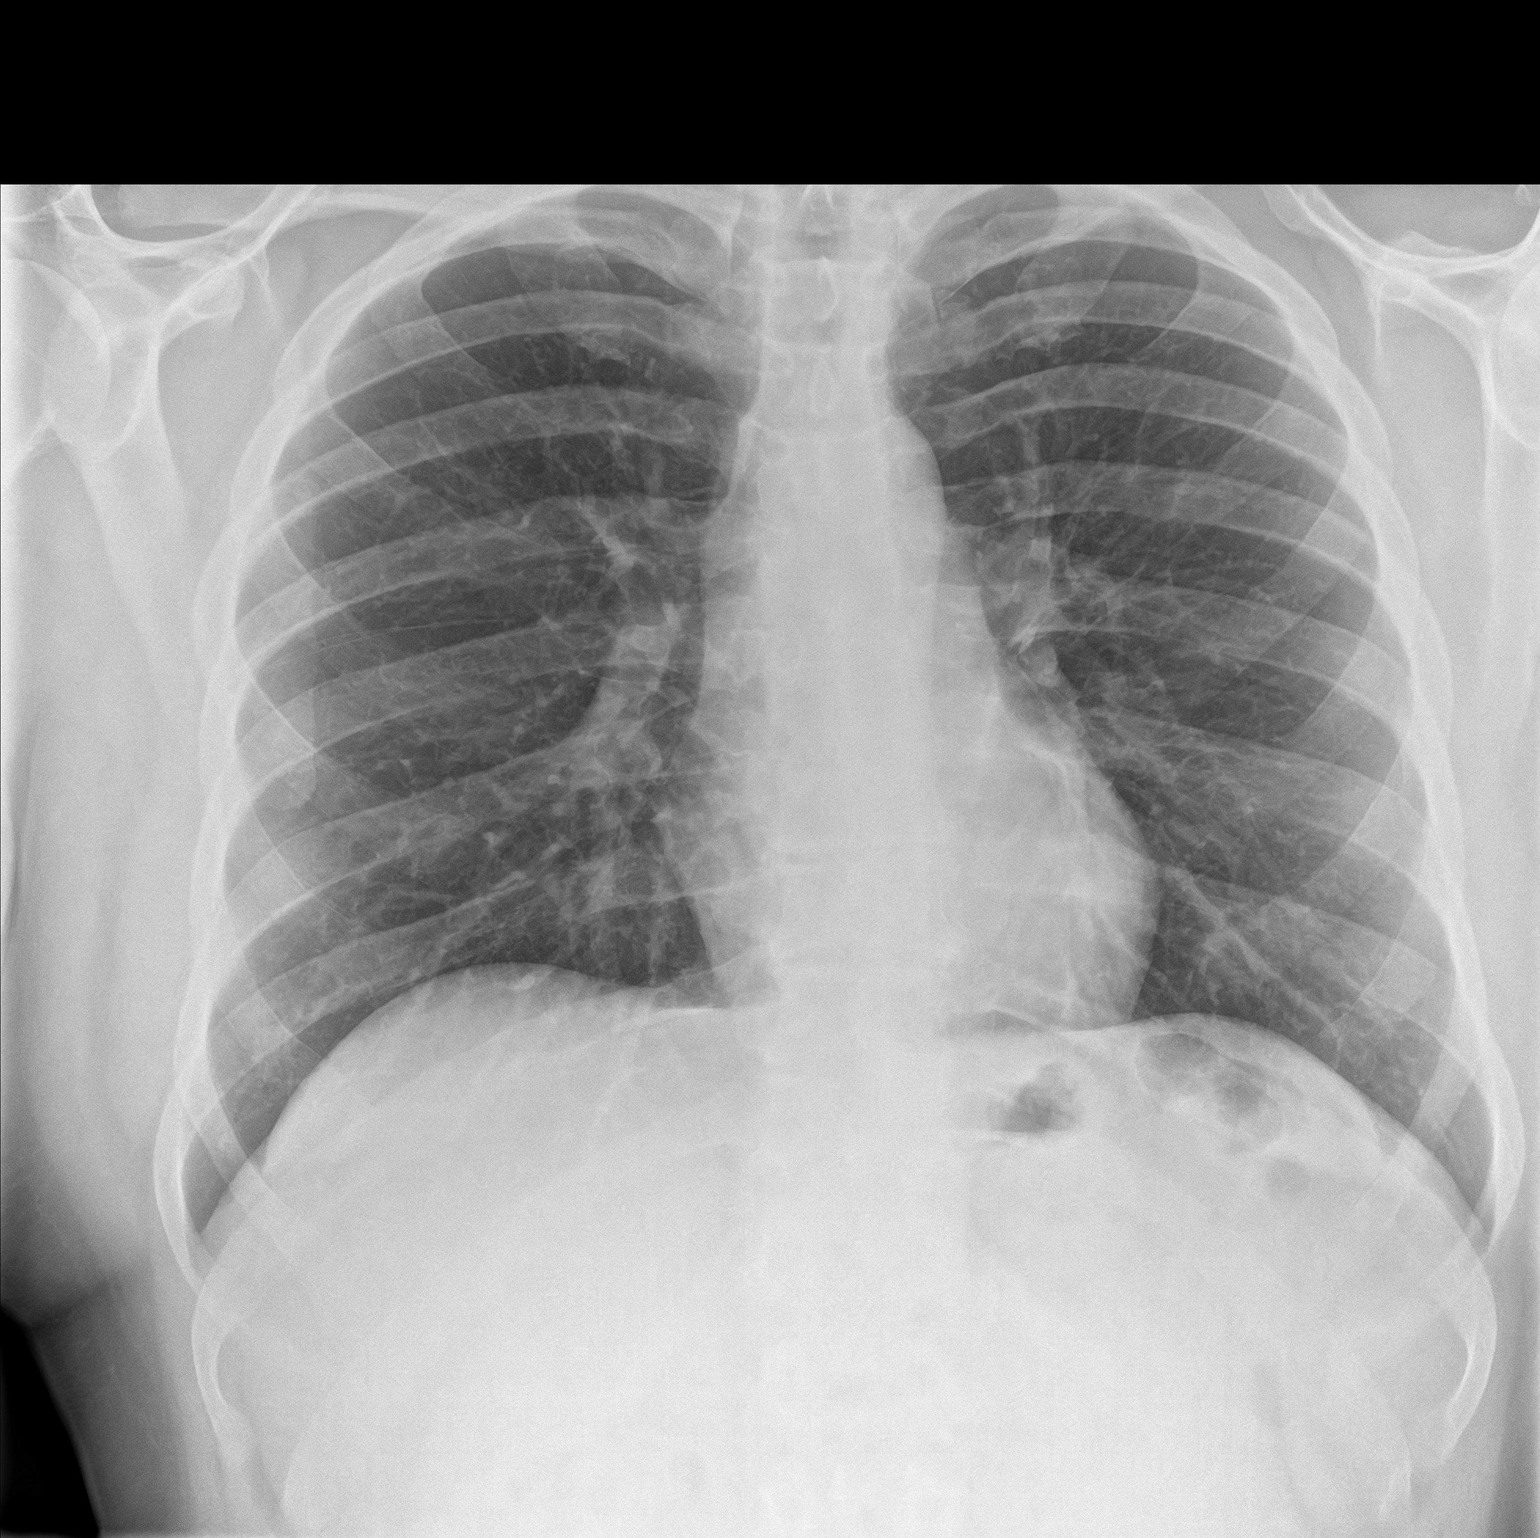
[im 2/2]
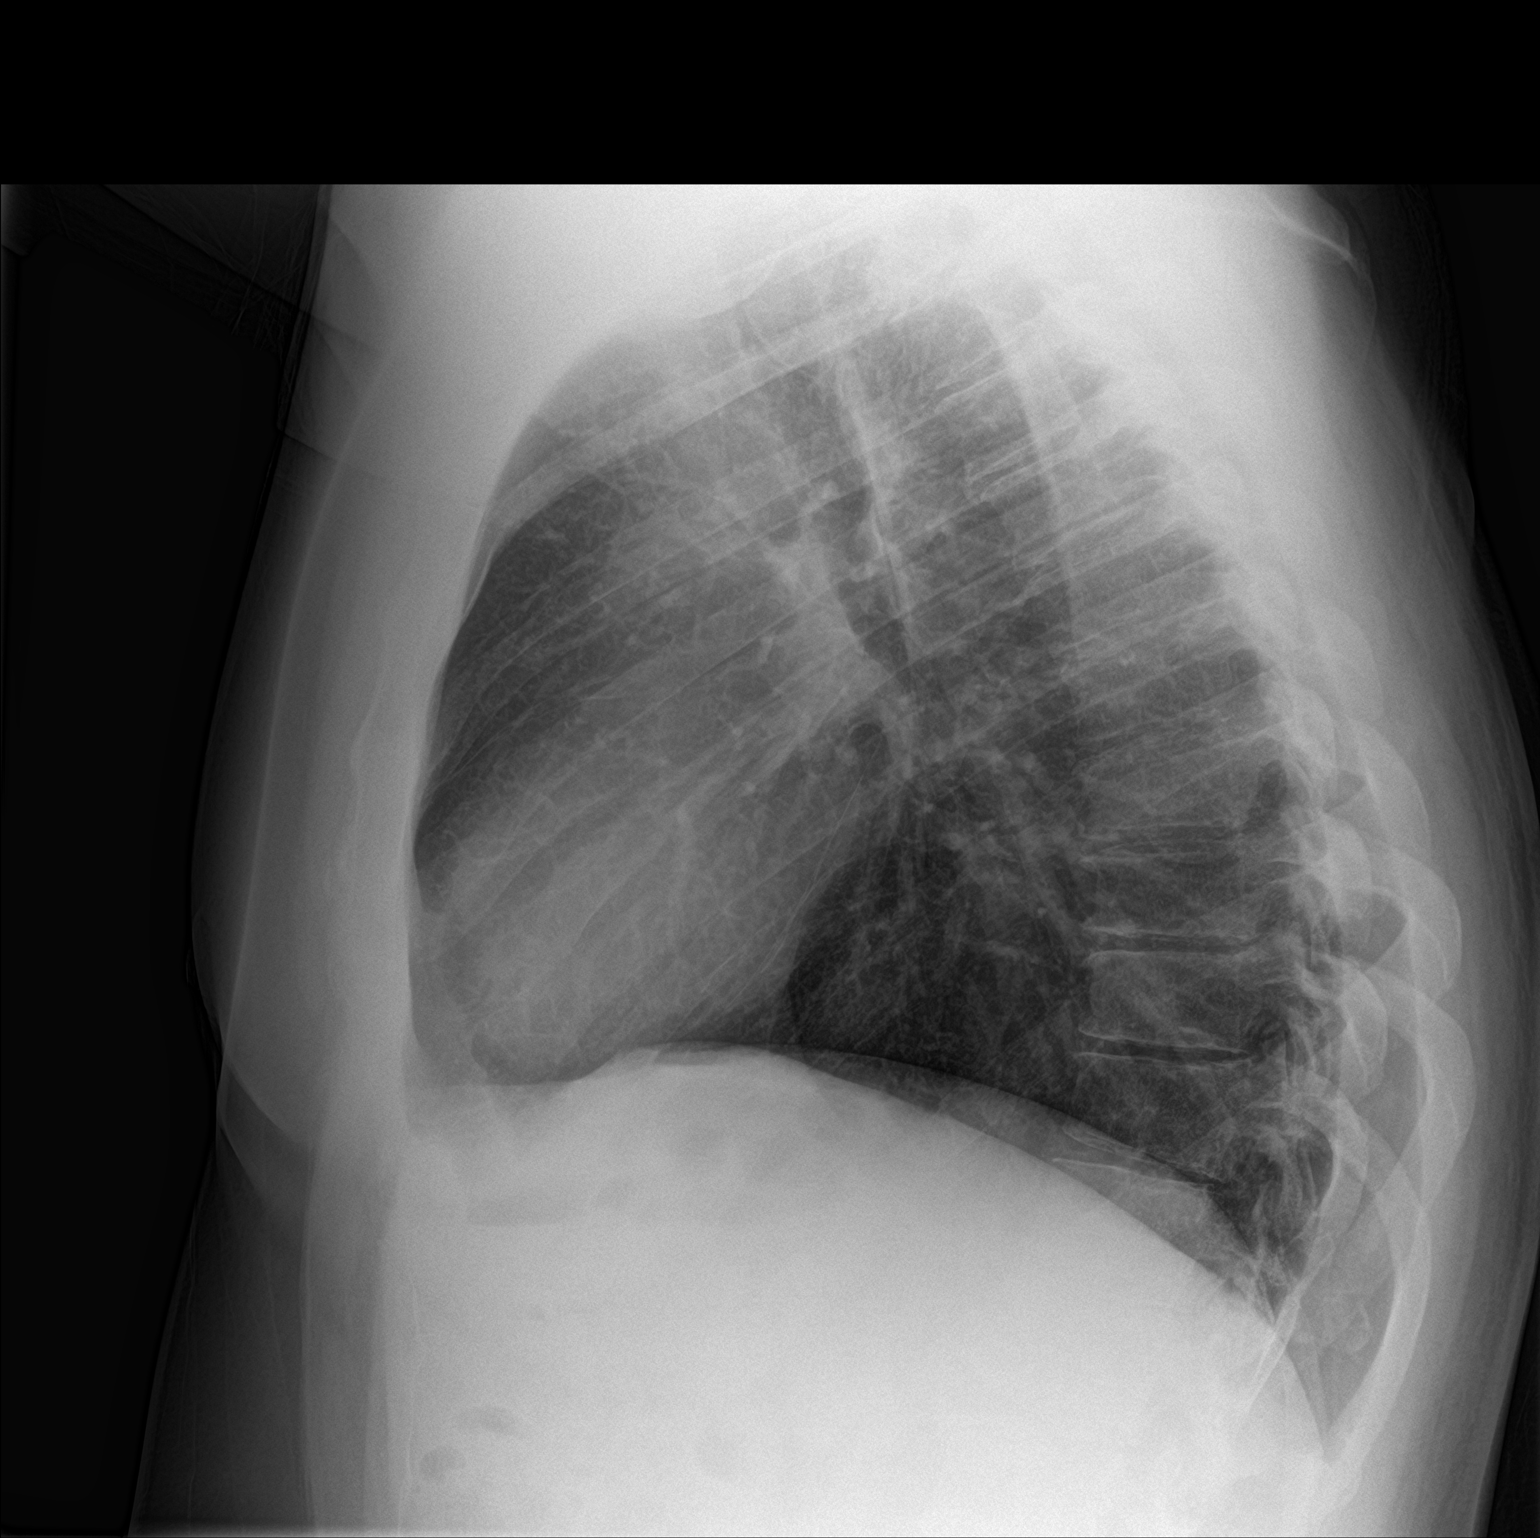

[2 of 2 positions shown; findings below may reference images not displayed]

FINDINGS: Lungs are clear. Heart size and pulmonary vascularity are normal. No
adenopathy. No pneumothorax. No bone lesions.
IMPRESSION: No abnormality noted.

## 2021-07-17 DIAGNOSIS — Z131 Encounter for screening for diabetes mellitus: Secondary | ICD-10-CM | POA: Diagnosis not present

## 2021-07-17 DIAGNOSIS — Z13220 Encounter for screening for lipoid disorders: Secondary | ICD-10-CM | POA: Diagnosis not present

## 2021-07-17 DIAGNOSIS — Z713 Dietary counseling and surveillance: Secondary | ICD-10-CM | POA: Diagnosis not present

## 2021-07-17 LAB — LIPID PANEL
Cholesterol: 198 (ref 0–200)
HDL: 46 (ref 35–70)
LDL Cholesterol: 103
Triglycerides: 136 (ref 40–160)

## 2021-07-17 LAB — BASIC METABOLIC PANEL: Glucose: 110

## 2021-08-17 ENCOUNTER — Encounter: Payer: Self-pay | Admitting: Family Medicine

## 2021-08-17 ENCOUNTER — Other Ambulatory Visit: Payer: Self-pay

## 2021-08-17 ENCOUNTER — Ambulatory Visit: Payer: BC Managed Care – PPO | Admitting: Family Medicine

## 2021-08-17 VITALS — BP 118/80 | HR 97 | Temp 98.0°F | Resp 18 | Ht 74.0 in | Wt 268.7 lb

## 2021-08-17 DIAGNOSIS — M25562 Pain in left knee: Secondary | ICD-10-CM

## 2021-08-17 DIAGNOSIS — M5030 Other cervical disc degeneration, unspecified cervical region: Secondary | ICD-10-CM

## 2021-08-17 DIAGNOSIS — E669 Obesity, unspecified: Secondary | ICD-10-CM | POA: Diagnosis not present

## 2021-08-17 DIAGNOSIS — F900 Attention-deficit hyperactivity disorder, predominantly inattentive type: Secondary | ICD-10-CM | POA: Diagnosis not present

## 2021-08-17 DIAGNOSIS — M5412 Radiculopathy, cervical region: Secondary | ICD-10-CM

## 2021-08-17 DIAGNOSIS — G8929 Other chronic pain: Secondary | ICD-10-CM

## 2021-08-17 DIAGNOSIS — E559 Vitamin D deficiency, unspecified: Secondary | ICD-10-CM

## 2021-08-17 MED ORDER — AMPHETAMINE-DEXTROAMPHETAMINE 10 MG PO TABS: 10.0000 mg | ORAL_TABLET | Freq: Every day | ORAL | 0 refills | Status: DC

## 2021-08-17 MED ORDER — AMPHETAMINE-DEXTROAMPHET ER 25 MG PO CP24: 25.0000 mg | ORAL_CAPSULE | ORAL | 0 refills | Status: DC

## 2021-08-17 MED ORDER — PREGABALIN 50 MG PO CAPS: 50.0000 mg | ORAL_CAPSULE | Freq: Three times a day (TID) | ORAL | 0 refills | Status: DC

## 2021-08-17 NOTE — Progress Notes (Signed)
Name: Christian Hamilton   MRN: [id number]    DOB: 09-23-1986   Date:08/17/2021       Progress Note  Subjective  Chief Complaint  Follow Up  HPI  ADHD: he is currently taking Adderal XR 25 mg, working 12 hour days and is not lasting the entire day, he would like to resume low dose around lunch. Denies side effects of medications  Obesity: BMI above 35, he is eating more at home, eating a lighter breakfast, lost 5 lbs since last visit. Drinking water   Left knee pain and instability: seen by Ortho in the past, was given meloxicam without help. He states he has a meniscal tear, sometimes is swells and sometimes it locks.He stopped meloxicam, and has been doing some home strength training and is feeling better.   DDD cervical: he has not been sleeping well again due to pain, he has radiculitis down right arm, trying to stretch at home, he was doing well, symptoms resumed a couple of weeks ago. Old [employer] injury.   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   Obesity (BMI 30-39.9) 08/08/2008    Past Surgical History:  Procedure Laterality Date   COLONOSCOPY WITH PROPOFOL N/A 07/23/2018   Procedure: COLONOSCOPY WITH PROPOFOL;  Surgeon: Lin Landsman, MD;  Location: [facility];  Service: Gastroenterology;  Laterality: N/A;   LEG SURGERY     SHOULDER SURGERY     TONSILECTOMY, ADENOIDECTOMY, BILATERAL MYRINGOTOMY AND TUBES     tubes in ears      Family History  Problem Relation Age of Onset   Kidney disease Father    ADD / ADHD  Brother     Social History   Tobacco Use   Smoking status: Never   Smokeless tobacco: Never  Substance Use Topics   Alcohol use: Yes    Alcohol/week: 6.0 standard drinks    Types: 6 Cans of beer per week     Current Outpatient Medications:    amphetamine-dextroamphetamine (ADDERALL XR) 25 MG 24 hr capsule, Take 1 capsule by mouth every morning., Disp: 30 capsule, Rfl: 0   amphetamine-dextroamphetamine (ADDERALL XR) 25 MG 24 hr capsule, Take 1 capsule by mouth every morning. Fill August 20 , 2022, Disp: 30 capsule, Rfl: 0   amphetamine-dextroamphetamine (ADDERALL XR) 25 MG 24 hr capsule, Take 1 capsule by mouth every morning. Fill September 19, Disp: 30 capsule, Rfl: 0  No Known Allergies  I personally reviewed active problem list, medication list, allergies, family history, social history, health maintenance with the patient/caregiver today.   ROS  Constitutional: Negative for fever or weight change.  Respiratory: Negative for cough and shortness of breath.   Cardiovascular: Negative for chest pain or palpitations.  Gastrointestinal: Negative for abdominal pain, no bowel changes.  Musculoskeletal: Negative for gait problem or joint swelling.  Skin: Negative for rash.  Neurological: Negative for dizziness or headache.  No other specific complaints in a complete review of systems (except as listed in HPI above).   Objective  Vitals:   08/17/21 1409  BP: 118/80  Pulse: 97  Resp: 18  Temp: 98 F (36.7 C)  TempSrc: Oral  SpO2: 98%  Weight: 268 lb 11.2 oz (121.9 kg)  Height: 6\' 2"  (1.88 m)    Body mass index is 34.5 kg/m.  Physical Exam  Constitutional: Patient appears well-developed and well-nourished. Obese  No distress.  HEENT: head atraumatic, normocephalic, pupils equal and reactive to light,  neck supple Cardiovascular: Normal rate, regular rhythm and normal heart sounds.  No murmur heard. No BLE edema. Pulmonary/Chest: Effort normal and breath sounds  normal. No respiratory distress. Abdominal: Soft.  There is no tenderness. Neurological: normal rom of shoulder, pain on right side of neck and trapezium muscle, normal grip  Psychiatric: Patient has a normal mood and affect. behavior is normal. Judgment and thought content normal.    PHQ2/9: Depression screen [facility] 2/9 08/17/2021 05/17/2021 02/19/2021 10/10/2020 01/10/2020  Decreased Interest 0 0 0 0 0  Down, Depressed, Hopeless 0 0 0 0 0  PHQ - 2 Score 0 0 0 0 0  Altered sleeping 0 - - - 0  Tired, decreased energy 0 - - - 0  Change in appetite 0 - - - 0  Feeling bad or failure about yourself  0 - - - 0  Trouble concentrating 0 - - - 0  Moving slowly or fidgety/restless 0 - - - 0  Suicidal thoughts 0 - - - 0  PHQ-9 Score 0 - - - 0  Difficult doing work/chores Not difficult at all - - - -  Some recent data might be hidden    phq 9 is negative   Fall Risk: Fall Risk  08/17/2021 05/17/2021 02/19/2021 10/10/2020 01/10/2020  Falls in the past year? 0 0 0 0 0  Number falls in past yr: 0 0 0 0 0  Injury with Fall? 0 0 0 0 0  Risk for fall due to : No Fall Risks - - - -  Follow up Falls prevention discussed Falls evaluation completed - - -      Functional Status Survey: Is the patient deaf or have difficulty hearing?: No Does the patient have difficulty seeing, even when wearing glasses/contacts?: No Does the patient have difficulty concentrating, remembering, or making decisions?: No Does the patient have difficulty walking or climbing stairs?: No Does the patient have difficulty dressing or bathing?: No Does the patient have difficulty doing errands alone such as visiting a doctor's office or shopping?: No    Assessment & Plan  1. Attention deficit hyperactivity disorder (ADHD), predominantly inattentive type  - amphetamine-dextroamphetamine (ADDERALL XR) 25 MG 24 hr capsule; Take 1 capsule by mouth every morning.  Dispense: 30 capsule; Refill: 0 - amphetamine-dextroamphetamine  (ADDERALL XR) 25 MG 24 hr capsule; Take 1 capsule by mouth every morning. Fill August 20 , 2022  Dispense: 30 capsule; Refill: 0 - amphetamine-dextroamphetamine (ADDERALL XR) 25 MG 24 hr capsule; Take 1 capsule by mouth every morning. Fill September 19  Dispense: 30 capsule; Refill: 0 - amphetamine-dextroamphetamine (ADDERALL) 10 MG tablet; Take 1 tablet (10 mg total) by mouth daily at 12 noon.  Dispense: 90 tablet; Refill: 0  2. Chronic pain of left knee  Doing better  3. Obesity (BMI 30-39.9)  Discussed with the patient the risk posed by an increased BMI. Discussed importance of portion control, calorie counting and at least 150 minutes of physical activity weekly. Avoid sweet beverages and drink more water. Eat at least 6 servings of fruit and vegetables daily    4. Vitamin D deficiency  5. DDD (degenerative disc disease), cervical  - pregabalin (LYRICA) 50 MG capsule; Take 1-3 capsules (50-150 mg total) by mouth 3 (three) times daily.  Dispense: 90 capsule; Refill: 0  6. Radiculitis, cervical  He prefers holding off on prednisone taper, but will call if he changes his mind.  He will try meloxicam that he has at home  - pregabalin (LYRICA) 50 MG capsule; Take 1-3 capsules (50-150 mg total) by mouth 3 (three) times daily.  Dispense: 90 capsule; Refill: 0

## 2021-08-24 ENCOUNTER — Other Ambulatory Visit: Payer: Self-pay | Admitting: Family Medicine

## 2021-08-24 DIAGNOSIS — M5030 Other cervical disc degeneration, unspecified cervical region: Secondary | ICD-10-CM

## 2021-08-24 DIAGNOSIS — M5412 Radiculopathy, cervical region: Secondary | ICD-10-CM

## 2021-11-16 NOTE — Progress Notes (Signed)
Name: Christian Hamilton   MRN: [id number]    DOB: 08-06-1986   Date:11/19/2021       Progress Note  Subjective  Chief Complaint  Follow Up  HPI  ADHD: he is currently taking Adderal XR 25 mg, working 12 hour days and is not lasting the entire day, he would like to resume low dose around lunch. Denies side effects of medications  Obesity: BMI is now below 35  he is eating more at home, eating a lighter breakfast, and also stopped drinking alcohol  Left knee pain and instability: seen by Ortho in the past, was given meloxicam without help. He states he has a meniscal tear, sometimes is swells and sometimes it locks. Still off Meloxicam and doing home stretching exercises   DDD cervical: he was having symptoms radiculitis down right arm, it was severe in the Fall and was keeping him awake at night, I gave him Lyrica but he decided to just stretch at home and is doing better. Symptoms have improved, it happens 3-4 times per week and can last hours and improves when he stands up It is from an Old [employer]'s [employer] injury.   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   Obesity (BMI 30-39.9) 08/08/2008    Past Surgical History:  Procedure Laterality Date   COLONOSCOPY WITH PROPOFOL N/A 07/23/2018   Procedure: COLONOSCOPY WITH PROPOFOL;  Surgeon: Lin Landsman, MD;  Location: [facility];  Service: Gastroenterology;  Laterality: N/A;   LEG SURGERY     SHOULDER SURGERY     TONSILECTOMY, ADENOIDECTOMY, BILATERAL MYRINGOTOMY AND TUBES     tubes in ears       Family History  Problem Relation Age of Onset   Kidney disease Father    ADD / ADHD Brother     Social History   Tobacco Use   Smoking status: Never   Smokeless tobacco: Never  Substance Use Topics   Alcohol use: Yes    Alcohol/week: 6.0 standard drinks    Types: 6 Cans of beer per week     Current Outpatient Medications:    amphetamine-dextroamphetamine (ADDERALL XR) 25 MG 24 hr capsule, Take 1 capsule by mouth every morning., Disp: 30 capsule, Rfl: 0   amphetamine-dextroamphetamine (ADDERALL XR) 25 MG 24 hr capsule, Take 1 capsule by mouth every morning. Fill August 20 , 2022, Disp: 30 capsule, Rfl: 0   amphetamine-dextroamphetamine (ADDERALL XR) 25 MG 24 hr capsule, Take 1 capsule by mouth every morning. Fill September 19, Disp: 30 capsule, Rfl: 0   amphetamine-dextroamphetamine (ADDERALL) 10 MG tablet, Take 1 tablet (10 mg total) by mouth daily at 12 noon., Disp: 90 tablet, Rfl: 0   pregabalin (LYRICA) 50 MG capsule, Take 1-3 capsules (50-150 mg total) by mouth 3 (three) times daily. (Patient not taking: Reported on 11/19/2021), Disp: 90 capsule, Rfl: 0  No Known Allergies  I personally reviewed active problem list, medication list, allergies, family history, social history, health maintenance with the patient/caregiver today.   ROS  Constitutional: Negative for fever, positive for  weight change.  Respiratory: Negative  for cough and shortness of breath.   Cardiovascular: Negative for chest pain or palpitations.  Gastrointestinal: Negative for abdominal pain, no bowel changes.  Musculoskeletal: Negative for gait problem or joint swelling.  Skin: Negative for rash.  Neurological: Negative for dizziness or headache.  No other specific complaints in a complete review of systems (except as listed in HPI above).   Objective  Vitals:   11/19/21 1249  BP: 114/76  Pulse: 90  Resp: 16  Temp: 98 F (36.7 C)  SpO2: 98%  Weight: 259 lb (117.5 kg)  Height: 6\' 2"  (1.88  m)    Body mass index is 33.25 kg/m.  Physical Exam  Constitutional: Patient appears well-developed and well-nourished. Obese  No distress.  HEENT: head atraumatic, normocephalic, pupils equal and reactive to light, neck supple Cardiovascular: Normal rate, regular rhythm and normal heart sounds.  No murmur heard. No BLE edema. Pulmonary/Chest: Effort normal and breath sounds normal. No respiratory distress. Abdominal: Soft.  There is no tenderness. Psychiatric: Patient has a normal mood and affect. behavior is normal. Judgment and thought content normal.   PHQ2/9: Depression screen [facility] 2/9 11/19/2021 08/17/2021 05/17/2021 02/19/2021 10/10/2020  Decreased Interest 0 0 0 0 0  Down, Depressed, Hopeless 0 0 0 0 0  PHQ - 2 Score 0 0 0 0 0  Altered sleeping 0 0 - - -  Tired, decreased energy 0 0 - - -  Change in appetite 0 0 - - -  Feeling bad or failure about yourself  0 0 - - -  Trouble concentrating 0 0 - - -  Moving slowly or fidgety/restless 0 0 - - -  Suicidal thoughts 0 0 - - -  PHQ-9 Score 0 0 - - -  Difficult doing work/chores - Not difficult at all - - -  Some recent data might be hidden    phq 9 is negative   Fall Risk: Fall Risk  11/19/2021 08/17/2021 05/17/2021 02/19/2021 10/10/2020  Falls in the past year? 0 0 0 0 0  Number falls in past yr: 0 0 0 0 0  Injury with Fall? 0 0 0 0 0  Risk for fall due to : No Fall Risks No Fall Risks - - -  Follow up Falls prevention discussed Falls prevention discussed Falls evaluation completed - -      Functional Status Survey: Is the patient deaf or have difficulty hearing?: No Does the patient have difficulty seeing, even when wearing glasses/contacts?: No Does the patient have difficulty concentrating, remembering, or making decisions?: No Does the patient have difficulty walking or climbing stairs?: No Does the patient have difficulty dressing or bathing?: No Does the patient have difficulty doing errands alone such as  visiting a doctor's office or shopping?: No    Assessment & Plan  1. Attention deficit hyperactivity disorder (ADHD), predominantly inattentive type  - amphetamine-dextroamphetamine (ADDERALL XR) 25 MG 24 hr capsule; Take 1 capsule by mouth every morning.  Dispense: 90 capsule; Refill: 0 - amphetamine-dextroamphetamine (ADDERALL) 10 MG tablet; Take 1 tablet (10 mg total) by mouth daily at 12 noon.  Dispense: 90 tablet; Refill: 0    2. Chronic pain of left knee  Doing better since he lost weight   3. DDD (degenerative disc disease), cervical   4. Vitamin D deficiency   5. Radiculitis, cervical  Improved since last visit

## 2021-11-19 ENCOUNTER — Ambulatory Visit (INDEPENDENT_AMBULATORY_CARE_PROVIDER_SITE_OTHER): Payer: BC Managed Care – PPO | Admitting: Family Medicine

## 2021-11-19 ENCOUNTER — Encounter: Payer: Self-pay | Admitting: Family Medicine

## 2021-11-19 VITALS — BP 114/76 | HR 90 | Temp 98.0°F | Resp 16 | Ht 74.0 in | Wt 259.0 lb

## 2021-11-19 DIAGNOSIS — M5412 Radiculopathy, cervical region: Secondary | ICD-10-CM

## 2021-11-19 DIAGNOSIS — M25562 Pain in left knee: Secondary | ICD-10-CM

## 2021-11-19 DIAGNOSIS — G8929 Other chronic pain: Secondary | ICD-10-CM

## 2021-11-19 DIAGNOSIS — M5030 Other cervical disc degeneration, unspecified cervical region: Secondary | ICD-10-CM

## 2021-11-19 DIAGNOSIS — F900 Attention-deficit hyperactivity disorder, predominantly inattentive type: Secondary | ICD-10-CM

## 2021-11-19 DIAGNOSIS — E559 Vitamin D deficiency, unspecified: Secondary | ICD-10-CM | POA: Diagnosis not present

## 2021-11-19 MED ORDER — AMPHETAMINE-DEXTROAMPHET ER 25 MG PO CP24: 25.0000 mg | ORAL_CAPSULE | ORAL | 0 refills | Status: DC

## 2021-11-19 MED ORDER — AMPHETAMINE-DEXTROAMPHETAMINE 10 MG PO TABS: 10.0000 mg | ORAL_TABLET | Freq: Every day | ORAL | 0 refills | Status: DC

## 2022-02-15 NOTE — Progress Notes (Signed)
Name: Christian Hamilton   MRN: [id number]    DOB: 1986-04-19   Date:02/18/2022 ? ?     Progress Note ? ?Subjective ? ?Chief Complaint ? ?Follow Up ? ?HPI ? ?ADHD: he is currently taking Adderal XR 25 mg, working 12 hour days and is not lasting the entire day, he takes XR in am and 10 mg in the afternoon. He states medication works well for him and denies side effects  ? ?Obesity: BMI is now below 35  he is eating more at home, since last visit he started intermittent fasting about 4 days a week, he does not fast when off work but eats a light breakfast when he skips the fasting. Eats from 3 pm till 6 pm usually Lost 17 lbs since last visit 3 months ago  ? ?Left knee pain and instability: seen by Ortho in the past, was given meloxicam without help. He states he has a meniscal tear, sometimes is swells and sometimes it locks. He is off medication anymore and states doing well since weight loss, no pain, occasionally popping sounds but no instability  ? ?DDD cervical: he was having symptoms radiculitis down right arm, it was severe in the Fall 22 and was keeping him awake at night, I gave him Lyrica but he decided to just stretch at home and is doing better. He states no recent episodes.  ? ?Vitamin D deficiency: he is taking supplementation and has helped his mood  ? ?Patient Active Problem List  ? Diagnosis Date Noted  ? DDD (degenerative disc disease), cervical 11/04/2017  ? Varicocele 07/25/2016  ? Neck pain 10/12/2015  ? Panic attack as reaction to stress 06/02/2015  ? ADD (attention deficit disorder) 05/30/2015  ? Back pain, chronic 05/30/2015  ? Chronic venous insufficiency 05/30/2015  ? Abnormal antinuclear antibody titer 05/30/2015  ? Migraine without aura and responsive to treatment 05/10/2009  ? 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 Obesity (BMI 30-39.9) 08/08/2008  ? ? ?Past Surgical History:  ?Procedure Laterality Date  ? COLONOSCOPY WITH PROPOFOL N/A 07/23/2018  ? Procedure: COLONOSCOPY WITH  PROPOFOL;  Surgeon: Lin Landsman, MD;  Location: [facility];  Service: Gastroenterology;  Laterality: N/A;  ? LEG SURGERY    ? SHOULDER SURGERY    ? TONSILECTOMY, ADENOIDECTOMY, BILATERAL MYRINGOTOMY AND TUBES    ? tubes in ears    ? ? ?Family History  ?Problem Relation Age of Onset  ? Kidney disease Father   ? ADD / ADHD Brother   ? ? ?Social History  ? ?Tobacco Use  ? Smoking status: Never  ? Smokeless tobacco: Never  ?Substance Use Topics  ? Alcohol use: Yes  ?  Alcohol/week: 6.0 standard drinks  ?  Types: 6 Cans of beer per week  ? ? ? ?Current Outpatient Medications:  ?  amphetamine-dextroamphetamine (ADDERALL XR) 25 MG 24 hr capsule, Take 1 capsule by mouth every morning., Disp: 30 capsule, Rfl: 0 ?  amphetamine-dextroamphetamine (ADDERALL XR) 25 MG 24 hr capsule, Take 1 capsule by mouth every morning. Fill August 20 , 2022, Disp: 30 capsule, Rfl: 0 ?  amphetamine-dextroamphetamine (ADDERALL XR) 25 MG 24 hr capsule, Take 1 capsule by mouth every morning., Disp: 90 capsule, Rfl: 0 ?  amphetamine-dextroamphetamine (ADDERALL) 10 MG tablet, Take 1 tablet (10 mg total) by mouth daily at 12 noon., Disp: 90 tablet, Rfl: 0 ? ?No Known Allergies ? ?I personally reviewed active problem list, medication list, allergies, family history, social history, health maintenance with  the patient/caregiver today. ? ? ?ROS ? ?Ten systems reviewed and is negative except as mentioned in HPI  ? ?Objective ? ?Vitals:  ? 02/18/22 1342  ?BP: 122/80  ?Pulse: 92  ?Resp: 16  ?SpO2: 99%  ?Weight: 242 lb (109.8 kg)  ?Height: '6\' 2"'$  (1.88 m)  ? ? ?Body mass index is 31.07 kg/m?. ? ?Physical Exam ? ?Constitutional: Patient appears well-developed and well-nourished. Obese  No distress.  ?HEENT: head atraumatic, normocephalic, pupils equal and reactive to light, neck supple ?Cardiovascular: Normal rate, regular rhythm and normal heart sounds.  No murmur heard. No BLE edema. ?Pulmonary/Chest: Effort normal and breath sounds normal. No  respiratory distress. ?Abdominal: Soft.  There is no tenderness. ?Psychiatric: Patient has a normal mood and affect. behavior is normal. Judgment and thought content normal.  ? ?PHQ2/9: ? ?  02/18/2022  ?  1:42 PM 11/19/2021  ? 12:48 PM 08/17/2021  ?  2:09 PM 05/17/2021  ?  2:23 PM 02/19/2021  ? 11:44 AM  ?Depression screen PHQ 2/9  ?Decreased Interest 0 0 0 0 0  ?Down, Depressed, Hopeless 0 0 0 0 0  ?PHQ - 2 Score 0 0 0 0 0  ?Altered sleeping 0 0 0    ?Tired, decreased energy 0 0 0    ?Change in appetite 0 0 0    ?Feeling bad or failure about yourself  0 0 0    ?Trouble concentrating 0 0 0    ?Moving slowly or fidgety/restless 0 0 0    ?Suicidal thoughts 0 0 0    ?PHQ-9 Score 0 0 0    ?Difficult doing work/chores   Not difficult at all    ?  ?phq 9 is negative ? ? ?Fall Risk: ? ?  02/18/2022  ?  1:42 PM 11/19/2021  ? 12:48 PM 08/17/2021  ?  2:08 PM 05/17/2021  ?  2:23 PM 02/19/2021  ? 11:44 AM  ?Fall Risk   ?Falls in the past year? 0 0 0 0 0  ?Number falls in past yr: 0 0 0 0 0  ?Injury with Fall? 0 0 0 0 0  ?Risk for fall due to : No Fall Risks No Fall Risks No Fall Risks    ?Follow up Falls prevention discussed Falls prevention discussed Falls prevention discussed Falls evaluation completed   ? ? ? ? ?Functional Status Survey: ?Is the patient deaf or have difficulty hearing?: No ?Does the patient have difficulty seeing, even when wearing glasses/contacts?: No ?Does the patient have difficulty concentrating, remembering, or making decisions?: No ?Does the patient have difficulty walking or climbing stairs?: No ?Does the patient have difficulty dressing or bathing?: No ?Does the patient have difficulty doing errands alone such as visiting a doctor's office or shopping?: No ? ? ? ?Assessment & Plan ? ?1. Attention deficit hyperactivity disorder (ADHD), predominantly inattentive type ? ?- amphetamine-dextroamphetamine (ADDERALL XR) 25 MG 24 hr capsule; Take 1 capsule by mouth every morning.  Dispense: 90 capsule; Refill:  0 ?- amphetamine-dextroamphetamine (ADDERALL) 10 MG tablet; Take 1 tablet (10 mg total) by mouth daily at 12 noon.  Dispense: 90 tablet; Refill: 0 ? ?2. DDD (degenerative disc disease), cervical ? ? ?3. Chronic pain of left knee ? ? ?4. Vitamin D deficiency ? ?- VITAMIN D 25 Hydroxy (Vit-D Deficiency, Fractures) ? ?5. Radiculitis, cervical ? ? ?6. Diabetes mellitus screening ? ?- Hemoglobin A1c ? ?7. Lipid screening ? ?- Lipid panel ? ?8. Long-term use of high-risk medication ? ?- COMPLETE  METABOLIC PANEL WITH GFR ?- CBC with Differential/Platelet  ?

## 2022-02-18 ENCOUNTER — Encounter: Payer: Self-pay | Admitting: Family Medicine

## 2022-02-18 ENCOUNTER — Ambulatory Visit (INDEPENDENT_AMBULATORY_CARE_PROVIDER_SITE_OTHER): Payer: BC Managed Care – PPO | Admitting: Family Medicine

## 2022-02-18 VITALS — BP 122/80 | HR 92 | Resp 16 | Ht 74.0 in | Wt 242.0 lb

## 2022-02-18 DIAGNOSIS — M5030 Other cervical disc degeneration, unspecified cervical region: Secondary | ICD-10-CM | POA: Diagnosis not present

## 2022-02-18 DIAGNOSIS — M25562 Pain in left knee: Secondary | ICD-10-CM | POA: Diagnosis not present

## 2022-02-18 DIAGNOSIS — E559 Vitamin D deficiency, unspecified: Secondary | ICD-10-CM

## 2022-02-18 DIAGNOSIS — G8929 Other chronic pain: Secondary | ICD-10-CM

## 2022-02-18 DIAGNOSIS — Z79899 Other long term (current) drug therapy: Secondary | ICD-10-CM

## 2022-02-18 DIAGNOSIS — Z13220 Encounter for screening for lipoid disorders: Secondary | ICD-10-CM | POA: Diagnosis not present

## 2022-02-18 DIAGNOSIS — M5412 Radiculopathy, cervical region: Secondary | ICD-10-CM

## 2022-02-18 DIAGNOSIS — Z131 Encounter for screening for diabetes mellitus: Secondary | ICD-10-CM | POA: Diagnosis not present

## 2022-02-18 DIAGNOSIS — F900 Attention-deficit hyperactivity disorder, predominantly inattentive type: Secondary | ICD-10-CM | POA: Diagnosis not present

## 2022-02-18 MED ORDER — AMPHETAMINE-DEXTROAMPHET ER 25 MG PO CP24: 25.0000 mg | ORAL_CAPSULE | ORAL | 0 refills | Status: DC

## 2022-02-18 MED ORDER — AMPHETAMINE-DEXTROAMPHETAMINE 10 MG PO TABS: 10.0000 mg | ORAL_TABLET | Freq: Every day | ORAL | 0 refills | Status: DC

## 2022-02-19 LAB — COMPLETE METABOLIC PANEL WITH GFR
AG Ratio: 2 (calc) (ref 1.0–2.5)
ALT: 16 U/L (ref 9–46)
AST: 14 U/L (ref 10–40)
Albumin: 4.3 g/dL (ref 3.6–5.1)
Alkaline phosphatase (APISO): 47 U/L (ref 36–130)
BUN: 12 mg/dL (ref 7–25)
CO2: 29 mmol/L (ref 20–32)
Calcium: 9.3 mg/dL (ref 8.6–10.3)
Chloride: 101 mmol/L (ref 98–110)
Creat: 0.92 mg/dL (ref 0.60–1.26)
Globulin: 2.1 g/dL (calc) (ref 1.9–3.7)
Glucose, Bld: 85 mg/dL (ref 65–99)
Potassium: 4.2 mmol/L (ref 3.5–5.3)
Sodium: 139 mmol/L (ref 135–146)
Total Bilirubin: 1 mg/dL (ref 0.2–1.2)
Total Protein: 6.4 g/dL (ref 6.1–8.1)
eGFR: 110 mL/min/{1.73_m2} (ref >=60)

## 2022-02-19 LAB — CBC WITH DIFFERENTIAL/PLATELET
Absolute Monocytes: 442 cells/uL (ref 200–950)
Basophils Absolute: 32 cells/uL (ref 0–200)
Basophils Relative: 0.5 %
Eosinophils Absolute: 102 cells/uL (ref 15–500)
Eosinophils Relative: 1.6 %
HCT: 42.1 % (ref 38.5–50.0)
Hemoglobin: 14.4 g/dL (ref 13.2–17.1)
Lymphs Abs: 1824 cells/uL (ref 850–3900)
MCH: 31.7 pg (ref 27.0–33.0)
MCHC: 34.2 g/dL (ref 32.0–36.0)
MCV: 92.7 fL (ref 80.0–100.0)
MPV: 12.1 fL (ref 7.5–12.5)
Monocytes Relative: 6.9 %
Neutro Abs: 4000 cells/uL (ref 1500–7800)
Neutrophils Relative %: 62.5 %
Platelets: 175 10*3/uL (ref 140–400)
RBC: 4.54 10*6/uL (ref 4.20–5.80)
RDW: 12 % (ref 11.0–15.0)
Total Lymphocyte: 28.5 %
WBC: 6.4 10*3/uL (ref 3.8–10.8)

## 2022-02-19 LAB — HEMOGLOBIN A1C
Hgb A1c MFr Bld: 5.4 % of total Hgb (ref <5.7)
Mean Plasma Glucose: 108 mg/dL
eAG (mmol/L): 6 mmol/L

## 2022-02-19 LAB — LIPID PANEL
Cholesterol: 150 mg/dL (ref <200)
HDL: 57 mg/dL (ref >=40)
LDL Cholesterol (Calc): 81 mg/dL (calc)
Non-HDL Cholesterol (Calc): 93 mg/dL (calc) (ref <130)
Total CHOL/HDL Ratio: 2.6 (calc) (ref <5.0)
Triglycerides: 42 mg/dL (ref <150)

## 2022-02-19 LAB — VITAMIN D 25 HYDROXY (VIT D DEFICIENCY, FRACTURES): Vit D, 25-Hydroxy: 35 ng/mL (ref 30–100)

## 2022-05-16 NOTE — Progress Notes (Signed)
Name: Christian Hamilton   MRN: [id number]    DOB: 05-07-1986   Date:05/17/2022       Progress Note  Subjective  Chief Complaint  Follow Up  HPI  ADHD: he is currently taking Adderal XR 25 mg, working 12 hour days , takes 10 mg around lunch time to last all day.  He states medication works well for him and denies side effects Medication helps him focus and be productive at work.   Obesity: BMI is now below 35  he is eating more at home, he is doing intermittent fasting and about once a month he goes 72 hours on a liquid diet , he had lost 17 lbs before his last visit and today he is down 7 more pounds   Left knee pain and instability: seen by Ortho in the past, was given meloxicam without help. He states he has a meniscal tear, sometimes is swells and sometimes it locks. Doing much better with weight loss, planning on going back to the gym   DDD cervical: he was having symptoms radiculitis down right arm, it was severe in the Fall 22 and was keeping him awake at night, I gave him Lyrica but he decided to just stretch at home and is doing better. Unchanged   Varicose: seen by vascular surgeon in the past and had laser treatment he is having swelling again but no pain lately. He has been wearing compression stocking hoses, he would like to go back for follow up  Vitamin D deficiency: he is taking supplementation and has helped his mood . 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   Obesity (BMI 30-39.9) 08/08/2008    Past Surgical History:   Procedure Laterality Date   COLONOSCOPY WITH PROPOFOL N/A 07/23/2018   Procedure: COLONOSCOPY WITH PROPOFOL;  Surgeon: Lin Landsman, MD;  Location: [place];  Service: Gastroenterology;  Laterality: N/A;   LEG SURGERY     SHOULDER SURGERY     TONSILECTOMY, ADENOIDECTOMY, BILATERAL MYRINGOTOMY AND TUBES     tubes in ears      Family History  Problem Relation Age of Onset   Kidney disease Father    ADD / ADHD Brother     Social History   Tobacco Use   Smoking status: Never   Smokeless tobacco: Never  Substance Use Topics   Alcohol use: Yes    Alcohol/week: 6.0 standard drinks of alcohol    Types: 6 Cans of beer per week     Current Outpatient Medications:    amphetamine-dextroamphetamine (ADDERALL XR) 25 MG 24 hr capsule, Take 1 capsule by mouth every morning., Disp: 90 capsule, Rfl: 0   amphetamine-dextroamphetamine (ADDERALL) 10 MG tablet, Take 1 tablet (10 mg total) by mouth daily at 12 noon., Disp: 90 tablet, Rfl: 0   Cholecalciferol (VITAMIN D) 50 MCG (2000 UT) CAPS, Take 1 capsule by mouth daily at 12 noon., Disp: , Rfl:   No Known Allergies  I personally reviewed active problem list, medication list, allergies, family history, social history, health maintenance with the patient/caregiver  today.   ROS  Constitutional: Negative for fever or weight change.  Respiratory: Negative for cough and shortness of breath.   Cardiovascular: Negative for chest pain or palpitations.  Gastrointestinal: Negative for abdominal pain, no bowel changes.  Musculoskeletal: Negative for gait problem or joint swelling.  Skin: Negative for rash.  Neurological: Negative for dizziness or headache.  No other specific complaints in a complete review of systems (except as listed in HPI above).   Objective  Vitals:   05/17/22 1317  BP: 130/80  Pulse: 94  Resp: 16  Temp: 98.1 F (36.7 C)  TempSrc: Oral  SpO2: 96%  Weight: 235 lb 4.8 oz (106.7 kg)  Height: _0  (1.88 m)     Body mass index is 30.21 kg/m.  Physical Exam  Constitutional: Patient appears well-developed and well-nourished. Obese  No distress.  HEENT: head atraumatic, normocephalic, pupils equal and reactive to light,  neck supple Cardiovascular: Normal rate, regular rhythm and normal heart sounds.  No murmur heard.. Trace of lower extremity edema, large varicose veins  Pulmonary/Chest: Effort normal and breath sounds normal. No respiratory distress. Abdominal: Soft.  There is no tenderness. Psychiatric: Patient has a normal mood and affect. behavior is normal. Judgment and thought content normal.   Recent Results (from the past 2160 hour(s))  Lipid panel     Status: None   Collection Time: 02/18/22  1:59 PM  Result Value Ref Range   Cholesterol 150 <200 mg/dL   HDL 57 > OR = 40 mg/dL   Triglycerides 42 <150 mg/dL   LDL Cholesterol (Calc) 81 mg/dL (calc)    Comment: Reference range: <100 . Desirable range <100 mg/dL for primary prevention;   <70 mg/dL for patients with CHD or diabetic patients  with > or = 2 CHD risk factors. Marland Kitchen LDL-C is now calculated using the Martin-Hopkins  calculation, which is a validated novel method providing  better accuracy than the Friedewald equation in the  estimation of LDL-C.  Cresenciano Genre et al. Annamaria Helling. 7001;749(44): 2061-2068  (http://education.[facility].com/faq/FAQ164)    Total CHOL/HDL Ratio 2.6 <5.0 (calc)   Non-HDL Cholesterol (Calc) 93 <130 mg/dL (calc)    Comment: For patients with diabetes plus 1 major ASCVD risk  factor, treating to a non-HDL-C goal of <100 mg/dL  (LDL-C of <70 mg/dL) is considered a therapeutic  option.   COMPLETE METABOLIC PANEL WITH GFR     Status: None   Collection Time: 02/18/22  1:59 PM  Result Value Ref Range   Glucose, Bld 85 65 - 99 mg/dL    Comment: .            Fasting reference interval .    BUN 12 7 - 25 mg/dL   Creat 0.92 0.60 - 1.26 mg/dL   eGFR 110 > OR = 60 mL/min/1.55m    Comment: The eGFR  is based on the CKD-EPI 2021 equation. To calculate  the new eGFR from a previous Creatinine or Cystatin C result, go to https://www.kidney.org/professionals/ kdoqi/gfr%5Fcalculator    BUN/Creatinine Ratio NOT APPLICABLE 6 - 22 (calc)   Sodium 139 135 - 146 mmol/L   Potassium 4.2 3.5 - 5.3 mmol/L   Chloride 101 98 - 110 mmol/L   CO2 29 20 - 32 mmol/L   Calcium 9.3 8.6 - 10.3 mg/dL   Total Protein 6.4 6.1 - 8.1 g/dL   Albumin 4.3 3.6 - 5.1 g/dL   Globulin 2.1 1.9 - 3.7 g/dL (calc)   AG Ratio 2.0 1.0 - 2.5 (calc)  Total Bilirubin 1.0 0.2 - 1.2 mg/dL   Alkaline phosphatase (APISO) 47 36 - 130 U/L   AST 14 10 - 40 U/L   ALT 16 9 - 46 U/L  CBC with Differential/Platelet     Status: None   Collection Time: 02/18/22  1:59 PM  Result Value Ref Range   WBC 6.4 3.8 - 10.8 Thousand/uL   RBC 4.54 4.20 - 5.80 Million/uL   Hemoglobin 14.4 13.2 - 17.1 g/dL   HCT 42.1 38.5 - 50.0 %   MCV 92.7 80.0 - 100.0 fL   MCH 31.7 27.0 - 33.0 pg   MCHC 34.2 32.0 - 36.0 g/dL   RDW 12.0 11.0 - 15.0 %   Platelets 175 140 - 400 Thousand/uL   MPV 12.1 7.5 - 12.5 fL   Neutro Abs 4,000 1,500 - 7,800 cells/uL   Lymphs Abs 1,824 850 - 3,900 cells/uL   Absolute Monocytes 442 200 - 950 cells/uL   Eosinophils Absolute 102 15 - 500 cells/uL   Basophils Absolute 32 0 - 200 cells/uL   Neutrophils Relative % 62.5 %   Total Lymphocyte 28.5 %   Monocytes Relative 6.9 %   Eosinophils Relative 1.6 %   Basophils Relative 0.5 %  Hemoglobin A1c     Status: None   Collection Time: 02/18/22  1:59 PM  Result Value Ref Range   Hgb A1c MFr Bld 5.4 <5.7 % of total Hgb    Comment: For the purpose of screening for the presence of diabetes: . <5.7%       Consistent with the absence of diabetes 5.7-6.4%    Consistent with increased risk for diabetes             (prediabetes) > or =6.5%  Consistent with diabetes . This assay result is consistent with a decreased risk of diabetes. . Currently, no consensus exists  regarding use of hemoglobin A1c for diagnosis of diabetes in children. . According to American Diabetes Association (ADA) guidelines, hemoglobin A1c <7.0% represents optimal control in non-pregnant diabetic patients. Different metrics may apply to specific patient populations.  Standards of Medical Care in Diabetes(ADA). .    Mean Plasma Glucose 108 mg/dL   eAG (mmol/L) 6.0 mmol/L  VITAMIN D 25 Hydroxy (Vit-D Deficiency, Fractures)     Status: None   Collection Time: 02/18/22  1:59 PM  Result Value Ref Range   Vit D, 25-Hydroxy 35 30 - 100 ng/mL    Comment: Vitamin D Status         25-OH Vitamin D: . Deficiency:                    <20 ng/mL Insufficiency:             20 - 29 ng/mL Optimal:                 > or = 30 ng/mL . For 25-OH Vitamin D testing on patients on  D2-supplementation and patients for whom quantitation  of D2 and D3 fractions is required, the [facility]ure[facility](TM) 25-OH VIT D, (D2,D3), LC/MS/MS is recommended: order  code [phone] (patients >53yr). See Note 1 . Note 1 . For additional information, please refer to  http://education.[facility].com/faq/FAQ199  (This link is being provided for informational/ educational purposes only.)     PHQ2/9:    05/17/2022    1:23 PM 02/18/2022    1:42 PM 11/19/2021   12:48 PM 08/17/2021    2:09 PM 05/17/2021    2:23 PM  Depression screen PHQ 2/9  Decreased Interest 0 0 0 0 0  Down, Depressed, Hopeless 0 0 0 0 0  PHQ - 2 Score 0 0 0 0 0  Altered sleeping 0 0 0 0   Tired, decreased energy 0 0 0 0   Change in appetite 0 0 0 0   Feeling bad or failure about yourself  0 0 0 0   Trouble concentrating 0 0 0 0   Moving slowly or fidgety/restless 0 0 0 0   Suicidal thoughts 0 0 0 0   PHQ-9 Score 0 0 0 0   Difficult doing work/chores    Not difficult at all     phq 9 is negative   Fall Risk:    05/17/2022    1:23 PM 02/18/2022    1:42 PM 11/19/2021   12:48 PM 08/17/2021    2:08 PM 05/17/2021    2:23 PM  Fall Risk    Falls in the past year? 0 0 0 0 0  Number falls in past yr:  0 0 0 0  Injury with Fall?  0 0 0 0  Risk for fall due to : No Fall Risks No Fall Risks No Fall Risks No Fall Risks   Follow up Falls prevention discussed Falls prevention discussed Falls prevention discussed Falls prevention discussed Falls evaluation completed      Functional Status Survey: Is the patient deaf or have difficulty hearing?: No Does the patient have difficulty seeing, even when wearing glasses/contacts?: No Does the patient have difficulty concentrating, remembering, or making decisions?: No Does the patient have difficulty walking or climbing stairs?: No Does the patient have difficulty dressing or bathing?: No Does the patient have difficulty doing errands alone such as visiting a doctor's office or shopping?: No    Assessment & Plan  1. Attention deficit hyperactivity disorder (ADHD), predominantly inattentive type  - amphetamine-dextroamphetamine (ADDERALL XR) 25 MG 24 hr capsule; Take 1 capsule by mouth every morning.  Dispense: 90 capsule; Refill: 0 - amphetamine-dextroamphetamine (ADDERALL) 10 MG tablet; Take 1 tablet (10 mg total) by mouth daily at 12 noon.  Dispense: 90 tablet; Refill: 0  2. DDD (degenerative disc disease), cervical   3. Vitamin D deficiency   4. Obesity (BMI 30-39.9)  Discussed with the patient the risk posed by an increased BMI. Discussed importance of portion control, calorie counting and at least 150 minutes of physical activity weekly. Avoid sweet beverages and drink more water. Eat at least 6 servings of fruit and vegetables daily    5. Varicose veins of left leg with edema  - Ambulatory referral to Vascular Surgery

## 2022-05-17 ENCOUNTER — Encounter: Payer: Self-pay | Admitting: Family Medicine

## 2022-05-17 ENCOUNTER — Ambulatory Visit: Payer: BC Managed Care – PPO | Admitting: Family Medicine

## 2022-05-17 VITALS — BP 130/80 | HR 94 | Temp 98.1°F | Resp 16 | Ht 74.0 in | Wt 235.3 lb

## 2022-05-17 DIAGNOSIS — E669 Obesity, unspecified: Secondary | ICD-10-CM

## 2022-05-17 DIAGNOSIS — I83892 Varicose veins of left lower extremities with other complications: Secondary | ICD-10-CM

## 2022-05-17 DIAGNOSIS — F900 Attention-deficit hyperactivity disorder, predominantly inattentive type: Secondary | ICD-10-CM

## 2022-05-17 DIAGNOSIS — M5030 Other cervical disc degeneration, unspecified cervical region: Secondary | ICD-10-CM | POA: Diagnosis not present

## 2022-05-17 DIAGNOSIS — E559 Vitamin D deficiency, unspecified: Secondary | ICD-10-CM | POA: Diagnosis not present

## 2022-05-17 MED ORDER — AMPHETAMINE-DEXTROAMPHETAMINE 10 MG PO TABS: 10.0000 mg | ORAL_TABLET | Freq: Every day | ORAL | 0 refills | Status: DC

## 2022-05-17 MED ORDER — AMPHETAMINE-DEXTROAMPHET ER 25 MG PO CP24: 25.0000 mg | ORAL_CAPSULE | ORAL | 0 refills | Status: DC

## 2022-07-02 NOTE — Progress Notes (Unsigned)
Name: Christian Hamilton   MRN: [id number]    DOB: 22-Sep-1986   Date:07/03/2022       Progress Note  Subjective  Chief Complaint  Annual Exam  HPI  Patient presents for annual CPE.  Diet: he has gained weight since last visit, he was living with in laws, moving now and eating more carbs lately, he will resume a healthier diet  Exercise: continue regular follow up Last Dental Exam: every 6 months Last Eye Exam: recently   Depression: phq 9 is negative    07/03/2022   12:52 PM 05/17/2022    1:23 PM 02/18/2022    1:42 PM 11/19/2021   12:48 PM 08/17/2021    2:09 PM  Depression screen PHQ 2/9  Decreased Interest 0 0 0 0 0  Down, Depressed, Hopeless 0 0 0 0 0  PHQ - 2 Score 0 0 0 0 0  Altered sleeping 0 0 0 0 0  Tired, decreased energy 0 0 0 0 0  Change in appetite 0 0 0 0 0  Feeling bad or failure about yourself  0 0 0 0 0  Trouble concentrating 0 0 0 0 0  Moving slowly or fidgety/restless 0 0 0 0 0  Suicidal thoughts 0 0 0 0 0  PHQ-9 Score 0 0 0 0 0  Difficult doing work/chores     Not difficult at all    Hypertension:  BP Readings from Last 3 Encounters:  07/03/22 132/78  05/17/22 130/80  02/18/22 122/80    Obesity: Wt Readings from Last 3 Encounters:  07/03/22 243 lb 8 oz (110.5 kg)  05/17/22 235 lb 4.8 oz (106.7 kg)  02/18/22 242 lb (109.8 kg)   BMI Readings from Last 3 Encounters:  07/03/22 31.26 kg/m  05/17/22 30.21 kg/m  02/18/22 31.07 kg/m     Lipids:  Lab Results  Component Value Date   CHOL 150 02/18/2022   CHOL 198 07/17/2021   CHOL 169 10/11/2019   Lab Results  Component Value Date   HDL 57 02/18/2022   HDL 46 07/17/2021   HDL 61 10/11/2019   Lab Results  Component Value Date   LDLCALC 81 02/18/2022   LDLCALC 103 07/17/2021   LDLCALC 91 10/11/2019   Lab Results  Component Value Date   TRIG 42 02/18/2022   TRIG 136 07/17/2021   TRIG 77 10/11/2019   Lab Results  Component Value Date   CHOLHDL 2.6 02/18/2022   CHOLHDL 2.8 10/11/2019    CHOLHDL 3.2 01/26/2018   No results found for: "LDLDIRECT" Glucose:  Glucose, Bld  Date Value Ref Range Status  02/18/2022 85 65 - 99 mg/dL Final    Comment:    .            Fasting reference interval .   03/05/2020 115 (H) 70 - 99 mg/dL Final    Comment:    Glucose reference range applies only to samples taken after fasting for at least 8 hours.  10/11/2019 93 65 - 99 mg/dL Final    Comment:    .            Fasting reference interval .     [place] [facility] Visit from 07/03/2022 in [facility]  AUDIT-C Score 1      Married STD testing and prevention (HIV/chl/gon/syphilis): N/A Sexual history: one partner  Hep C Screening: 10/11/19 Skin cancer: Discussed monitoring for atypical lesions - he wants to see a dermatologist  Colorectal cancer: 07/23/18  Prostate cancer:  father has a history of prostate cancer - diagnosed in his 32's    ECG:  03/06/20  Vaccines:   HPV: N/A Tdap: today  Shingrix: N/A Pneumonia: N/A Flu: refused  COVID-19: refused   Advanced Care Planning: A voluntary discussion about advance care planning including the explanation and discussion of advance directives.  Discussed health care proxy and Living will, and the patient was able to identify a health care proxy as wife.  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   Obesity (BMI 30-39.9) 08/08/2008    Past Surgical History:  Procedure Laterality Date    COLONOSCOPY WITH PROPOFOL N/A 07/23/2018   Procedure: COLONOSCOPY WITH PROPOFOL;  Surgeon: Lin Landsman, MD;  Location: [place];  Service: Gastroenterology;  Laterality: N/A;   LEG SURGERY     SHOULDER SURGERY     TONSILECTOMY, ADENOIDECTOMY, BILATERAL MYRINGOTOMY AND TUBES     tubes in ears      Family History  Problem Relation Age of Onset   Kidney disease Father    ADD / ADHD Brother     Social History   Socioeconomic History   Marital status: Married    Spouse name: [name]   Number of children: 3   Years of education: Not on file   Highest education level: Not on file  Occupational History   Occupation: Freight forwarder    Comment: Hotel manager  Tobacco Use   Smoking status: Never   Smokeless tobacco: Never  Vaping Use   Vaping Use: Never used  Substance and Sexual Activity   Alcohol use: Yes    Alcohol/week: 6.0 standard drinks of alcohol    Types: 6 Cans of beer per week   Drug use: No   Sexual activity: Yes  Other Topics Concern   Not on file  Social History Narrative   He is married and has 3 children   Social Determinants of Health   Financial Resource Strain: Low Risk  (07/03/2022)   Overall Financial Resource Strain (CARDIA)    Difficulty of Paying Living Expenses: Not hard at all  Food Insecurity: No Food Insecurity (07/03/2022)   Hunger Vital Sign    Worried About Running Out of Food in the Last Year: Never true    Ran Out of Food in the Last Year: Never true  Transportation Needs: No Transportation Needs (07/03/2022)   PRAPARE - Hydrologist (Medical): No    Lack of Transportation (Non-Medical): No  Physical Activity: Sufficiently Active (07/03/2022)   Exercise Vital Sign    Days of Exercise per Week: 7 days    Minutes of Exercise per Session: 30 min  Stress: No Stress Concern Present (07/03/2022)   [place]    Feeling of Stress : Not at all   Social Connections: [place] (07/03/2022)   Social Connection and Isolation Panel [NHANES]    Frequency of Communication with Friends and Family:  More than three times a week    Frequency of Social Gatherings with Friends and Family: More than three times a week    Attends Religious Services: More than 4 times per year    Active Member of Clubs or Organizations: Yes    Attends Music therapist: More than 4 times per year    Marital Status: Married  Human resources officer Violence: Not At Risk (07/03/2022)   Humiliation, Afraid, Rape, and Kick questionnaire    Fear of Current or Ex-Partner: No    Emotionally Abused: No    Physically Abused: No    Sexually Abused: No     Current Outpatient Medications:    amphetamine-dextroamphetamine (ADDERALL XR) 25 MG 24 hr capsule, Take 1 capsule by mouth every morning., Disp: 90 capsule, Rfl: 0   amphetamine-dextroamphetamine (ADDERALL) 10 MG tablet, Take 1 tablet (10 mg total) by mouth daily at 12 noon., Disp: 90 tablet, Rfl: 0   Cholecalciferol (VITAMIN D) 50 MCG (2000 UT) CAPS, Take 1 capsule by mouth daily at 12 noon., Disp: , Rfl:   No Known Allergies   ROS  Constitutional: Negative for fever, positive for  weight change.  Respiratory: Negative for cough and shortness of breath.   Cardiovascular: Negative for chest pain or palpitations.  Gastrointestinal: Negative for abdominal pain, no bowel changes.  Musculoskeletal: Negative for gait problem or joint swelling.  Skin: Negative for rash.  Neurological: Negative for dizziness or headache.  No other specific complaints in a complete review of systems (except as listed in HPI above).    Objective  Vitals:   07/03/22 1257  BP: 132/78  Pulse: 92  Resp: 16  Temp: 98.3 F (36.8 C)  TempSrc: Oral  SpO2: 98%  Weight: 243 lb 8 oz (110.5 kg)  Height: '6\' 2"'$  (1.88 m)    Body mass index is 31.26 kg/m.  Physical Exam  Constitutional: Patient appears well-developed  and well-nourished. No distress.  HENT: Head: Normocephalic and atraumatic. Ears: B TMs ok, no erythema or effusion; Nose: Nose normal. Mouth/Throat: Oropharynx is clear and moist. No oropharyngeal exudate.  Eyes: Conjunctivae and EOM are normal. Pupils are equal, round, and reactive to light. No scleral icterus.  Neck: Normal range of motion. Neck supple. No JVD present. No thyromegaly present.  Cardiovascular: Normal rate, regular rhythm and normal heart sounds.  No murmur heard. No BLE edema. Pulmonary/Chest: Effort normal and breath sounds normal. No respiratory distress. Abdominal: Soft. Bowel sounds are normal, no distension. There is no tenderness. no masses MALE GENITALIA: Normal descended testes bilaterally, no masses palpated, no hernias, no lesions, no discharge RECTAL: not done  Musculoskeletal: Normal range of motion, no joint effusions. No gross deformities Neurological: he is alert and oriented to person, place, and time. No cranial nerve deficit. Coordination, balance, strength, speech and gait are normal.  Skin: Skin is warm and dry. No rash noted. No erythema.  Psychiatric: Patient has a normal mood and affect. behavior is normal. Judgment and thought content normal.   Fall Risk:    07/03/2022   12:59 PM 05/17/2022    1:23 PM 02/18/2022    1:42 PM 11/19/2021   12:48 PM 08/17/2021    2:08 PM  Fall Risk   Falls in the past year? 0 0 0 0 0  Number falls in past yr:   0 0 0  Injury with Fall?   0 0 0  Risk for fall due to : No Fall Risks No Fall Risks No Fall  Risks No Fall Risks No Fall Risks  Follow up Falls prevention discussed;Education provided;Falls evaluation completed Falls prevention discussed Falls prevention discussed Falls prevention discussed Falls prevention discussed     Functional Status Survey: Is the patient deaf or have difficulty hearing?: No Does the patient have difficulty seeing, even when wearing glasses/contacts?: No Does the patient have difficulty  concentrating, remembering, or making decisions?: No Does the patient have difficulty walking or climbing stairs?: No Does the patient have difficulty dressing or bathing?: No Does the patient have difficulty doing errands alone such as visiting a doctor's office or shopping?: No    Assessment & Plan  1. Well adult exam   2. Family history of skin cancer  - Ambulatory referral to Dermatology    -Prostate cancer screening and PSA options (with potential risks and benefits of testing vs not testing) were discussed along with recent recs/guidelines. -USPSTF grade A and B recommendations reviewed with patient; age-appropriate recommendations, preventive care, screening tests, etc discussed and encouraged; healthy living encouraged; see AVS for patient education given to patient -Discussed importance of 150 minutes of physical activity weekly, eat two servings of fish weekly, eat one serving of tree nuts ( cashews, pistachios, pecans, almonds.Marland Kitchen) every other day, eat 6 servings of fruit/vegetables daily and drink plenty of water and avoid sweet beverages.  -Reviewed Health Maintenance: yes

## 2022-07-02 NOTE — Patient Instructions (Incomplete)
Preventive Care 21-39 Years Old, Male Preventive care refers to lifestyle choices and visits with your health care provider that can promote health and wellness. Preventive care visits are also called wellness exams. What can I expect for my preventive care visit? Counseling During your preventive care visit, your health care provider may ask about your: Medical history, including: Past medical problems. Family medical history. Current health, including: Emotional well-being. Home life and relationship well-being. Sexual activity. Lifestyle, including: Alcohol, nicotine or tobacco, and drug use. Access to firearms. Diet, exercise, and sleep habits. Safety issues such as seatbelt and bike helmet use. Sunscreen use. Work and work environment. Physical exam Your health care provider may check your: Height and weight. These may be used to calculate your BMI (body mass index). BMI is a measurement that tells if you are at a healthy weight. Waist circumference. This measures the distance around your waistline. This measurement also tells if you are at a healthy weight and may help predict your risk of certain diseases, such as type 2 diabetes and high blood pressure. Heart rate and blood pressure. Body temperature. Skin for abnormal spots. What immunizations do I need?  Vaccines are usually given at various ages, according to a schedule. Your health care provider will recommend vaccines for you based on your age, medical history, and lifestyle or other factors, such as travel or where you work. What tests do I need? Screening Your health care provider may recommend screening tests for certain conditions. This may include: Lipid and cholesterol levels. Diabetes screening. This is done by checking your blood sugar (glucose) after you have not eaten for a while (fasting). Hepatitis B test. Hepatitis C test. HIV (human immunodeficiency virus) test. STI (sexually transmitted infection)  testing, if you are at risk. Talk with your health care provider about your test results, treatment options, and if necessary, the need for more tests. Follow these instructions at home: Eating and drinking  Eat a healthy diet that includes fresh fruits and vegetables, whole grains, lean protein, and low-fat dairy products. Drink enough fluid to keep your urine pale yellow. Take vitamin and mineral supplements as recommended by your health care provider. Do not drink alcohol if your health care provider tells you not to drink. If you drink alcohol: Limit how much you have to 0-2 drinks a day. Know how much alcohol is in your drink. In the [place] one drink equals one 12 oz bottle of beer (355 mL), one 5 oz glass of wine (148 mL), or one 1 oz glass of hard liquor (44 mL). Lifestyle Brush your teeth every morning and night with fluoride toothpaste. Floss one time each day. Exercise for at least 30 minutes 5 or more days each week. Do not use any products that contain nicotine or tobacco. These products include cigarettes, chewing tobacco, and vaping devices, such as e-cigarettes. If you need help quitting, ask your health care provider. Do not use drugs. If you are sexually active, practice safe sex. Use a condom or other form of protection to prevent STIs. Find healthy ways to manage stress, such as: Meditation, yoga, or listening to music. Journaling. Talking to a trusted person. Spending time with friends and family. Minimize exposure to UV radiation to reduce your risk of skin cancer. Safety Always wear your seat belt while driving or riding in a vehicle. Do not drive: If you have been drinking alcohol. Do not ride with someone who has been drinking. If you have been using any mind-altering substances   or drugs. While texting. When you are tired or distracted. Wear a helmet and other protective equipment during sports activities. If you have firearms in your house, make sure you  follow all gun safety procedures. Seek help if you have been physically or sexually abused. What's next? Go to your health care provider once a year for an annual wellness visit. Ask your health care provider how often you should have your eyes and teeth checked. Stay up to date on all vaccines. This information is not intended to replace advice given to you by your health care provider. Make sure you discuss any questions you have with your health care provider. Document Revised: 04/11/2021 Document Reviewed: 04/11/2021 Elsevier Patient Education  2023 [employer] Inc.  

## 2022-07-03 ENCOUNTER — Ambulatory Visit (INDEPENDENT_AMBULATORY_CARE_PROVIDER_SITE_OTHER): Payer: BC Managed Care – PPO | Admitting: Family Medicine

## 2022-07-03 ENCOUNTER — Encounter: Payer: Self-pay | Admitting: Family Medicine

## 2022-07-03 VITALS — BP 132/78 | HR 92 | Temp 98.3°F | Resp 16 | Ht 74.0 in | Wt 243.5 lb

## 2022-07-03 DIAGNOSIS — Z0000 Encounter for general adult medical examination without abnormal findings: Secondary | ICD-10-CM | POA: Diagnosis not present

## 2022-07-03 DIAGNOSIS — Z808 Family history of malignant neoplasm of other organs or systems: Secondary | ICD-10-CM | POA: Diagnosis not present

## 2022-08-19 NOTE — Progress Notes (Signed)
Name: Christian Hamilton   MRN: [id number]    DOB: 1986/07/07   Date:08/20/2022       Progress Note  Subjective  Chief Complaint  Follow Up  HPI  ADHD: he is currently taking Adderal XR 25 mg, working 12 hour days , takes 10 mg around lunch time to last all day.  He states medication works well for him and denies side effects Medication helps him focus and be productive at work. He needs refills today   Overweight : BMI is now below 30, he lost 15 lbs since last visit. He is on a keto diet, and has been feeling well, he asked me to recheck his labs since his diet and is high in fat and protein   Left knee pain and instability: seen by Ortho in the past, was given meloxicam without help. He states he has a meniscal tear, sometimes is swells and sometimes it locks. Doing much better with weight loss. He has not been back to the gym yet   DDD cervical: he was having symptoms radiculitis down right arm, it was severe in the Fall 22 and was keeping him awake at night, I gave him Lyrica but he decided to just stretch at home and is doing better. Doing well at this time   Varicose: seen by vascular surgeon in the past and had laser treatment he is having swelling again but no pain lately. He has been wearing compression stocking hoses, states better since he lost weight   Vitamin D deficiency: he is taking supplementation and has helped his mood . 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   Obesity (BMI 30-39.9) 08/08/2008    Past Surgical  History:  Procedure Laterality Date   COLONOSCOPY WITH PROPOFOL N/A 07/23/2018   Procedure: COLONOSCOPY WITH PROPOFOL;  Surgeon: Lin Landsman, MD;  Location: [facility];  Service: Gastroenterology;  Laterality: N/A;   LEG SURGERY     SHOULDER SURGERY     TONSILECTOMY, ADENOIDECTOMY, BILATERAL MYRINGOTOMY AND TUBES     tubes in ears      Family History  Problem Relation Age of Onset   Kidney disease Father    ADD / ADHD Brother     Social History   Tobacco Use   Smoking status: Never   Smokeless tobacco: Never  Substance Use Topics   Alcohol use: Yes    Alcohol/week: 6.0 standard drinks of alcohol    Types: 6 Cans of beer per week     Current Outpatient Medications:    Cholecalciferol (VITAMIN D) 50 MCG (2000 UT) CAPS, Take 1 capsule by mouth daily at 12 noon., Disp: , Rfl:    amphetamine-dextroamphetamine (ADDERALL XR) 25 MG 24 hr capsule, Take 1 capsule by mouth every morning., Disp: 90 capsule, Rfl: 0   amphetamine-dextroamphetamine (ADDERALL) 10 MG tablet, Take 1 tablet (10 mg total) by mouth daily at 12 noon., Disp: 90 tablet, Rfl: 0  No Known Allergies  I personally reviewed active problem list, medication list, allergies, family history, social history, health maintenance with the  patient/caregiver today.   ROS  Constitutional: Negative for fever , positive for weight change.  Respiratory: Negative for cough and shortness of breath.   Cardiovascular: Negative for chest pain or palpitations.  Gastrointestinal: Negative for abdominal pain, no bowel changes.  Musculoskeletal: Negative for gait problem or joint swelling.  Skin: Negative for rash.  Neurological: Negative for dizziness or headache.  No other specific complaints in a complete review of systems (except as listed in HPI above).   Objective  Vitals:   08/20/22 1317  BP: 126/78  Pulse: 98  Resp: 14  Temp: 98.7 F (37.1 C)  TempSrc: Oral  SpO2: 99%  Weight: 229 lb 1.6 oz (103.9 kg)   Height: '6\' 2"'$  (1.88 m)    Body mass index is 29.41 kg/m.  Physical Exam  Constitutional: Patient appears well-developed and well-nourished.  No distress.  HEENT: head atraumatic, normocephalic, pupils equal and reactive to light, neck supple Cardiovascular: Normal rate, regular rhythm and normal heart sounds.  No murmur heard. No BLE edema. Pulmonary/Chest: Effort normal and breath sounds normal. No respiratory distress. Abdominal: Soft.  There is no tenderness. Psychiatric: Patient has a normal mood and affect. behavior is normal. Judgment and thought content normal.    PHQ2/9:    08/20/2022    1:21 PM 07/03/2022   12:52 PM 05/17/2022    1:23 PM 02/18/2022    1:42 PM 11/19/2021   12:48 PM  Depression screen PHQ 2/9  Decreased Interest 0 0 0 0 0  Down, Depressed, Hopeless 0 0 0 0 0  PHQ - 2 Score 0 0 0 0 0  Altered sleeping 0 0 0 0 0  Tired, decreased energy 0 0 0 0 0  Change in appetite 0 0 0 0 0  Feeling bad or failure about yourself  0 0 0 0 0  Trouble concentrating 0 0 0 0 0  Moving slowly or fidgety/restless 0 0 0 0 0  Suicidal thoughts 0 0 0 0 0  PHQ-9 Score 0 0 0 0 0    phq 9 is negative   Fall Risk:    08/20/2022    1:20 PM 07/03/2022   12:59 PM 05/17/2022    1:23 PM 02/18/2022    1:42 PM 11/19/2021   12:48 PM  Fall Risk   Falls in the past year? 0 0 0 0 0  Number falls in past yr:    0 0  Injury with Fall?    0 0  Risk for fall due to : No Fall Risks No Fall Risks No Fall Risks No Fall Risks No Fall Risks  Follow up Falls prevention discussed;Education provided;Falls evaluation completed Falls prevention discussed;Education provided;Falls evaluation completed Falls prevention discussed Falls prevention discussed Falls prevention discussed      Functional Status Survey: Is the patient deaf or have difficulty hearing?: No Does the patient have difficulty seeing, even when wearing glasses/contacts?: No Does the patient have difficulty concentrating,  remembering, or making decisions?: No Does the patient have difficulty walking or climbing stairs?: No Does the patient have difficulty dressing or bathing?: No Does the patient have difficulty doing errands alone such as visiting a doctor's office or shopping?: No    Assessment & Plan  1. Attention deficit hyperactivity disorder (ADHD), predominantly inattentive type  - amphetamine-dextroamphetamine (ADDERALL XR) 25 MG 24 hr capsule; Take 1 capsule by mouth every morning.  Dispense: 90 capsule; Refill: 0 - amphetamine-dextroamphetamine (ADDERALL) 10 MG tablet; Take 1 tablet (10 mg total) by  mouth daily at 12 noon.  Dispense: 90 tablet; Refill: 0  2. Ketogenic diet monitoring encounter  - Lipid panel - COMPLETE METABOLIC PANEL WITH GFR - Ketones, urine  3. DDD (degenerative disc disease), cervical   4. Varicose veins of left leg with edema   5. Vitamin D deficiency   6. Overweight (BMI 25.0-29.9)

## 2022-08-20 ENCOUNTER — Ambulatory Visit: Payer: BC Managed Care – PPO | Admitting: Family Medicine

## 2022-08-20 ENCOUNTER — Encounter: Payer: Self-pay | Admitting: Family Medicine

## 2022-08-20 ENCOUNTER — Other Ambulatory Visit: Payer: Self-pay | Admitting: Family Medicine

## 2022-08-20 VITALS — BP 126/78 | HR 98 | Temp 98.7°F | Resp 14 | Ht 74.0 in | Wt 229.1 lb

## 2022-08-20 DIAGNOSIS — F900 Attention-deficit hyperactivity disorder, predominantly inattentive type: Secondary | ICD-10-CM

## 2022-08-20 DIAGNOSIS — I83892 Varicose veins of left lower extremities with other complications: Secondary | ICD-10-CM | POA: Diagnosis not present

## 2022-08-20 DIAGNOSIS — E559 Vitamin D deficiency, unspecified: Secondary | ICD-10-CM | POA: Insufficient documentation

## 2022-08-20 DIAGNOSIS — M5030 Other cervical disc degeneration, unspecified cervical region: Secondary | ICD-10-CM

## 2022-08-20 DIAGNOSIS — E663 Overweight: Secondary | ICD-10-CM | POA: Insufficient documentation

## 2022-08-20 DIAGNOSIS — Z713 Dietary counseling and surveillance: Secondary | ICD-10-CM | POA: Diagnosis not present

## 2022-08-20 MED ORDER — AMPHETAMINE-DEXTROAMPHET ER 25 MG PO CP24: 25.0000 mg | ORAL_CAPSULE | ORAL | 0 refills | Status: DC

## 2022-08-20 MED ORDER — AMPHETAMINE-DEXTROAMPHETAMINE 10 MG PO TABS: 10.0000 mg | ORAL_TABLET | Freq: Every day | ORAL | 0 refills | Status: DC

## 2022-08-21 LAB — COMPLETE METABOLIC PANEL WITHOUT GFR
AG Ratio: 2.3 (calc) (ref 1.0–2.5)
ALT: 22 U/L (ref 9–46)
AST: 17 U/L (ref 10–40)
Albumin: 4.3 g/dL (ref 3.6–5.1)
Alkaline phosphatase (APISO): 43 U/L (ref 36–130)
BUN: 17 mg/dL (ref 7–25)
CO2: 31 mmol/L (ref 20–32)
Calcium: 9.1 mg/dL (ref 8.6–10.3)
Chloride: 101 mmol/L (ref 98–110)
Creat: 0.66 mg/dL (ref 0.60–1.26)
Globulin: 1.9 g/dL (ref 1.9–3.7)
Glucose, Bld: 117 mg/dL — ABNORMAL HIGH (ref 65–99)
Potassium: 3.9 mmol/L (ref 3.5–5.3)
Sodium: 139 mmol/L (ref 135–146)
Total Bilirubin: 0.5 mg/dL (ref 0.2–1.2)
Total Protein: 6.2 g/dL (ref 6.1–8.1)
eGFR: 124 mL/min/1.73m2

## 2022-08-21 LAB — LIPID PANEL
Cholesterol: 136 mg/dL
HDL: 59 mg/dL
LDL Cholesterol (Calc): 64 mg/dL
Non-HDL Cholesterol (Calc): 77 mg/dL
Total CHOL/HDL Ratio: 2.3 (calc)
Triglycerides: 58 mg/dL

## 2022-08-27 LAB — KETONE PANEL, URINE
ACETONE: NOT DETECTED mg/dL
CYCLOHEXANONE: NOT DETECTED ug/mL
METHYL ETHYL KETONE (MEK): NOT DETECTED ug/mL
METHYL ISOBUTYL KETONE (MIBK): NOT DETECTED ug/mL

## 2022-09-04 ENCOUNTER — Other Ambulatory Visit (INDEPENDENT_AMBULATORY_CARE_PROVIDER_SITE_OTHER): Payer: Self-pay | Admitting: Nurse Practitioner

## 2022-09-04 DIAGNOSIS — I83892 Varicose veins of left lower extremities with other complications: Secondary | ICD-10-CM

## 2022-09-09 ENCOUNTER — Encounter (INDEPENDENT_AMBULATORY_CARE_PROVIDER_SITE_OTHER): Payer: Self-pay | Admitting: Vascular Surgery

## 2022-09-09 ENCOUNTER — Ambulatory Visit (INDEPENDENT_AMBULATORY_CARE_PROVIDER_SITE_OTHER): Payer: BC Managed Care – PPO | Admitting: Vascular Surgery

## 2022-09-09 ENCOUNTER — Ambulatory Visit (INDEPENDENT_AMBULATORY_CARE_PROVIDER_SITE_OTHER): Payer: Self-pay

## 2022-09-09 VITALS — BP 119/81 | HR 79 | Resp 16 | Wt 226.0 lb

## 2022-09-09 DIAGNOSIS — I872 Venous insufficiency (chronic) (peripheral): Secondary | ICD-10-CM

## 2022-09-09 DIAGNOSIS — M5030 Other cervical disc degeneration, unspecified cervical region: Secondary | ICD-10-CM

## 2022-09-09 DIAGNOSIS — I83892 Varicose veins of left lower extremities with other complications: Secondary | ICD-10-CM

## 2022-09-09 DIAGNOSIS — I8310 Varicose veins of unspecified lower extremity with inflammation: Secondary | ICD-10-CM | POA: Insufficient documentation

## 2022-09-09 NOTE — Progress Notes (Signed)
MRN : [id number]  Christian Hamilton is a 36 y.o. (03/04/1986) male who presents with chief complaint of varicose veins hurt.  History of Present Illness:   The patient is seen for evaluation of symptomatic varicose veins. The patient relates burning and stinging which worsened steadily throughout the course of the day, particularly with standing. The patient also notes an aching and throbbing pain over the varicosities, particularly with prolonged dependent positions. The symptoms are significantly improved with elevation.  The patient also notes that during hot weather the symptoms are greatly intensified. The patient states the pain from the varicose veins interferes with work, daily exercise, shopping and household maintenance. At this point, the symptoms are persistent and severe enough that they're having a negative impact on lifestyle and are interfering with daily activities.  There is no history of DVT, PE or superficial thrombophlebitis. There is no history of ulceration or hemorrhage. The patient denies a significant family history of varicose veins.  The patient has not worn graduated compression in the past. At the present time the patient has not been using over-the-counter analgesics. There is no history of prior surgical intervention or sclerotherapy.  Duplex ultrasound of the left venous system shows normal deep venous system with superficial reflux in the GSV  No outpatient medications have been marked as taking for the 09/09/22 encounter (Appointment) with Delana Meyer, Dolores Lory, MD.    Past Medical History:  Diagnosis Date   ADHD (attention deficit hyperactivity disorder)    Anxiety    Asthma    Back pain    DDD (degenerative disc disease), cervical    Leg varices 05/30/2015   Dr. Hulda Humphrey    Rectal bleeding     Past Surgical History:  Procedure Laterality Date   COLONOSCOPY WITH PROPOFOL N/A 07/23/2018   Procedure: COLONOSCOPY WITH PROPOFOL;  Surgeon: Lin Landsman, MD;  Location: [facility] ENDOSCOPY;  Service: Gastroenterology;  Laterality: N/A;   LEG SURGERY     SHOULDER SURGERY     TONSILECTOMY, ADENOIDECTOMY, BILATERAL MYRINGOTOMY AND TUBES     tubes in ears      Social History Social History   Tobacco Use   Smoking status: Never   Smokeless tobacco: Never  Vaping Use   Vaping Use: Never used  Substance Use Topics   Alcohol use: Yes    Alcohol/week: 6.0 standard drinks of alcohol    Types: 6 Cans of beer per week   Drug use: No    Family History Family History  Problem Relation Age of Onset   Kidney disease Father    ADD / ADHD Brother     No Known Allergies   REVIEW OF SYSTEMS (Negative unless checked)  Constitutional: '[]'$ Weight loss  '[]'$ Fever  '[]'$ Chills Cardiac: '[]'$ Chest pain   '[]'$ Chest pressure   '[]'$ Palpitations   '[]'$ Shortness of breath when laying flat   '[]'$ Shortness of breath with exertion. Vascular:  '[]'$ Pain in legs with walking   '[x]'$ Pain in legs with standing  '[]'$ History of DVT   '[]'$ Phlebitis   '[]'$ Swelling in legs   '[x]'$ Varicose veins   '[]'$ Non-healing ulcers Pulmonary:   '[]'$ Uses home oxygen   '[]'$ Productive cough   '[]'$ Hemoptysis   '[]'$ Wheeze  '[]'$ COPD   '[]'$ Asthma Neurologic:  '[]'$ Dizziness   '[]'$ Seizures   '[]'$ History of stroke   '[]'$ History of TIA  '[]'$ Aphasia   '[]'$ Vissual changes   '[]'$ Weakness or numbness in arm   '[]'$ Weakness or numbness in leg Musculoskeletal:   '[]'$ Joint swelling   '[]'$ Joint pain   '[]'$   Low back pain Hematologic:  '[]'$ Easy bruising  '[]'$ Easy bleeding   '[]'$ Hypercoagulable state   '[]'$ Anemic Gastrointestinal:  '[]'$ Diarrhea   '[]'$ Vomiting  '[]'$ Gastroesophageal reflux/heartburn   '[]'$ Difficulty swallowing. Genitourinary:  '[]'$ Chronic kidney disease   '[]'$ Difficult urination  '[]'$ Frequent urination   '[]'$ Blood in urine Skin:  '[]'$ Rashes   '[]'$ Ulcers  Psychological:  '[]'$ History of anxiety   '[]'$  History of major depression.  Physical Examination  There were no vitals filed for this visit. There is no height or weight on file to calculate BMI. Gen: WD/WN, NAD Head: [place]/AT,  No temporalis wasting.  Ear/Nose/Throat: Hearing grossly intact, nares w/o erythema or drainage, pinna without lesions Eyes: PER, EOMI, sclera nonicteric.  Neck: Supple, no gross masses.  No JVD.  Pulmonary:  Good air movement, no audible wheezing, no use of accessory muscles.  Cardiac: RRR, precordium not hyperdynamic. Vascular:  Large varicosities present, greater than 10 mm left.  Veins are tender to palpation  Mild venous stasis changes to the legs bilaterally.  Trace soft pitting edema CEAP [id number] Vessel Right Left  Radial Palpable Palpable  Gastrointestinal: soft, non-distended. No guarding/no peritoneal signs.  Musculoskeletal: M/S 5/5 throughout.  No deformity.  Neurologic: CN 2-12 intact. Pain and light touch intact in extremities.  Symmetrical.  Speech is fluent. Motor exam as listed above. Psychiatric: Judgment intact, Mood & affect appropriate for pt's clinical situation. Dermatologic: Venous rashes no ulcers noted.  No changes consistent with cellulitis. Lymph : No lichenification or skin changes of chronic lymphedema.  CBC Lab Results  Component Value Date   WBC 6.4 02/18/2022   HGB 14.4 02/18/2022   HCT 42.1 02/18/2022   MCV 92.7 02/18/2022   PLT 175 02/18/2022    BMET    Component Value Date/Time   NA 139 08/20/2022 1354   K 3.9 08/20/2022 1354   CL 101 08/20/2022 1354   CO2 31 08/20/2022 1354   GLUCOSE 117 (H) 08/20/2022 1354   BUN 17 08/20/2022 1354   CREATININE 0.66 08/20/2022 1354   CALCIUM 9.1 08/20/2022 1354   GFRNONAA >60 03/05/2020 1429   GFRNONAA 115 10/11/2019 0000   GFRAA >60 03/05/2020 1429   GFRAA 133 10/11/2019 0000   CrCl cannot be calculated (Unknown ideal weight.).  COAG No results found for: "INR", "PROTIME"  Radiology No results found.   Assessment/Plan 1. Varicose veins with inflammation  Recommend:  The patient has large symptomatic varicose veins that are painful and associated with swelling.  I have had a long  discussion with the patient regarding  varicose veins and why they cause symptoms.  Patient will begin wearing graduated compression stockings class 1 on a daily basis, beginning first thing in the morning and removing them in the evening. The patient is instructed specifically not to sleep in the stockings.    The patient  will also begin using over-the-counter analgesics such as Motrin 600 mg po TID to help control the symptoms.    In addition, behavioral modification including elevation during the day will be initiated.    Duplex ultrasound of the left venous system shows normal deep venous system with superficial reflux in the GSV  Further plans will be based on whether conservative therapies are successful at eliminating the pain and swelling.   2. Chronic venous insufficiency Recommend:  The patient has large symptomatic varicose veins that are painful and associated with swelling.  I have had a long discussion with the patient regarding  varicose veins and why they cause symptoms.  Patient will begin wearing  graduated compression stockings class 1 on a daily basis, beginning first thing in the morning and removing them in the evening. The patient is instructed specifically not to sleep in the stockings.    The patient  will also begin using over-the-counter analgesics such as Motrin 600 mg po TID to help control the symptoms.    In addition, behavioral modification including elevation during the day will be initiated.    Duplex ultrasound of the left venous system shows normal deep venous system with superficial reflux in the GSV  Further plans will be based on whether conservative therapies are successful at eliminating the pain and swelling.   3. DDD (degenerative disc disease), cervical Continue NSAID medications as already ordered, these medications have been reviewed and there are no changes at this time.  Continued activity and therapy was stressed.  4. Varicose veins of  left leg with edema Recommend:  The patient has large symptomatic varicose veins that are painful and associated with swelling.  I have had a long discussion with the patient regarding  varicose veins and why they cause symptoms.  Patient will begin wearing graduated compression stockings class 1 on a daily basis, beginning first thing in the morning and removing them in the evening. The patient is instructed specifically not to sleep in the stockings.    The patient  will also begin using over-the-counter analgesics such as Motrin 600 mg po TID to help control the symptoms.    In addition, behavioral modification including elevation during the day will be initiated.    Duplex ultrasound of the left venous system shows normal deep venous system with superficial reflux in the GSV  Further plans will be based on whether conservative therapies are successful at eliminating the pain and swelling.  - VAS [place] LOWER EXTREMITY VENOUS REFLUX    Hortencia Pilar, MD  09/09/2022 12:53 PM

## 2022-09-15 ENCOUNTER — Encounter (INDEPENDENT_AMBULATORY_CARE_PROVIDER_SITE_OTHER): Payer: Self-pay | Admitting: Vascular Surgery

## 2022-11-19 NOTE — Progress Notes (Unsigned)
Name: Christian Hamilton   MRN: [id number]    DOB: 03/21/1986   Date:11/20/2022       Progress Note  Subjective  Chief Complaint  Follow Up  HPI  ADHD: he is currently taking Adderal XR 25 mg, working 12 hour days , takes 10 mg around lunch time to last all day.  He states medication works well for him and denies side effects Medication helps him focus and be productive at work. He needs refills today   Overweight : BMI is now below 30, his heaviest weight was over 300 lbs after his first son was born, he went down to 46 's lbs in 2021 and since October 2022  he started a low carb diet, intermittent fasting and has been gradually losing weight. He states he is feeling better physically , he states planning on going to the gym to gain some muscle mass.   Left knee pain and instability: seen by Ortho in the past, was given meloxicam without help. He states he has a meniscal tear, sometimes is swells and sometimes it locks. Doing much better with weight loss. He is planning on going back to the gym  DDD cervical: he was having symptoms radiculitis down right arm, it was severe in the Fall 22 and was keeping him awake at night, I gave him Lyrica but he decided to just stretch at home and is doing better. Unchanged  Lateral epicondylitis right side : started a couple of months ago, it started in the fall while throwing discs and a football, certain movements causes it to flare but not a constant pain, taking nsaid's prn only   Varicose: seen by vascular surgeon in the past and had laser treatment he is having swelling again but no pain lately. He has been wearing compression stocking hoses, states better since he lost weight Unchanged   Vitamin D deficiency: he needs to resume supplementation   Patient Active Problem List   Diagnosis Date Noted   Varicose veins with inflammation 09/09/2022   Overweight (BMI 25.0-29.9) 08/20/2022   Vitamin D deficiency 08/20/2022   Ketogenic diet monitoring  encounter 08/20/2022   DDD (degenerative disc disease), cervical 11/04/2017   Varicocele 07/25/2016   Neck pain 10/12/2015   Panic attack as reaction to stress 06/02/2015   ADD (attention deficit disorder) 05/30/2015   Back pain, chronic 05/30/2015   Chronic venous insufficiency 05/30/2015   Abnormal antinuclear antibody titer 05/30/2015   Varicose veins of left leg with edema 05/30/2015   Migraine without aura and responsive to treatment 05/10/2009   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   Obesity (BMI 30-39.9) 08/08/2008    Past Surgical History:  Procedure Laterality Date   COLONOSCOPY WITH PROPOFOL N/A 07/23/2018   Procedure: COLONOSCOPY WITH PROPOFOL;  Surgeon: Lin Landsman, MD;  Location: [facility] ENDOSCOPY;  Service: Gastroenterology;  Laterality: N/A;   LEG SURGERY     SHOULDER SURGERY     TONSILECTOMY, ADENOIDECTOMY, BILATERAL MYRINGOTOMY AND TUBES     tubes in ears      Family History  Problem Relation Age of Onset   Kidney disease Father    ADD / ADHD Brother     Social History   Tobacco Use   Smoking status: Never   Smokeless tobacco: Never  Substance Use Topics   Alcohol use: Yes    Alcohol/week: 6.0 standard drinks of alcohol    Types: 6 Cans of beer per week     Current Outpatient Medications:  amphetamine-dextroamphetamine (ADDERALL XR) 25 MG 24 hr capsule, Take 1 capsule by mouth every morning., Disp: 90 capsule, Rfl: 0   amphetamine-dextroamphetamine (ADDERALL) 10 MG tablet, Take 1 tablet (10 mg total) by mouth daily at 12 noon., Disp: 90 tablet, Rfl: 0   Cholecalciferol (VITAMIN D) 50 MCG (2000 UT) CAPS, Take 1 capsule by mouth daily at 12 noon., Disp: , Rfl:   No Known Allergies  I personally reviewed active problem list, medication list, allergies, family history, social history, health maintenance with the patient/caregiver today.   ROS  Ten systems reviewed and is negative except as mentioned in HPI   Objective  Vitals:    11/20/22 1318  BP: 118/76  Pulse: 88  Resp: 16  Temp: 98.1 F (36.7 C)  TempSrc: Oral  SpO2: 98%  Weight: 221 lb 12.8 oz (100.6 kg)  Height: '6\' 2"'$  (1.88 m)    Body mass index is 28.48 kg/m.  Physical Exam  Constitutional: Patient appears well-developed and well-nourished.  No distress.  HEENT: head atraumatic, normocephalic, pupils equal and reactive to light, neck supple Cardiovascular: Normal rate, regular rhythm and normal heart sounds.  No murmur heard. No BLE edema. Pulmonary/Chest: Effort normal and breath sounds normal. No respiratory distress. Abdominal: Soft.  There is no tenderness. Muscular skeletal: normal elbow exam  Psychiatric: Patient has a normal mood and affect. behavior is normal. Judgment and thought content normal.    PHQ2/9:    11/20/2022    1:23 PM 08/20/2022    1:21 PM 07/03/2022   12:52 PM 05/17/2022    1:23 PM 02/18/2022    1:42 PM  Depression screen PHQ 2/9  Decreased Interest 0 0 0 0 0  Down, Depressed, Hopeless 0 0 0 0 0  PHQ - 2 Score 0 0 0 0 0  Altered sleeping 0 0 0 0 0  Tired, decreased energy 0 0 0 0 0  Change in appetite 0 0 0 0 0  Feeling bad or failure about yourself  0 0 0 0 0  Trouble concentrating 0 0 0 0 0  Moving slowly or fidgety/restless 0 0 0 0 0  Suicidal thoughts 0 0 0 0 0  PHQ-9 Score 0 0 0 0 0    phq 9 is negative   Fall Risk:    11/20/2022    1:23 PM 08/20/2022    1:20 PM 07/03/2022   12:59 PM 05/17/2022    1:23 PM 02/18/2022    1:42 PM  Fall Risk   Falls in the past year? 0 0 0 0 0  Number falls in past yr:     0  Injury with Fall?     0  Risk for fall due to : No Fall Risks No Fall Risks No Fall Risks No Fall Risks No Fall Risks  Follow up Falls prevention discussed Falls prevention discussed;Education provided;Falls evaluation completed Falls prevention discussed;Education provided;Falls evaluation completed Falls prevention discussed Falls prevention discussed      Functional Status Survey: Is the  patient deaf or have difficulty hearing?: No Does the patient have difficulty seeing, even when wearing glasses/contacts?: No Does the patient have difficulty concentrating, remembering, or making decisions?: No Does the patient have difficulty walking or climbing stairs?: No Does the patient have difficulty dressing or bathing?: No Does the patient have difficulty doing errands alone such as visiting a doctor's office or shopping?: No    Assessment & Plan  1. Attention deficit hyperactivity disorder (ADHD), predominantly inattentive type  -  amphetamine-dextroamphetamine (ADDERALL) 10 MG tablet; Take 1 tablet (10 mg total) by mouth daily at 12 noon.  Dispense: 90 tablet; Refill: 0 - amphetamine-dextroamphetamine (ADDERALL XR) 25 MG 24 hr capsule; Take 1 capsule by mouth every morning.  Dispense: 90 capsule; Refill: 0  2. Varicose veins of left leg with edema  Stable  3. DDD (degenerative disc disease), cervical   4. Ketogenic diet monitoring encounter   5. Vitamin D deficiency  Continue supplementation  6. Overweight (BMI 25.0-29.9)  On low carb diet   7. Chronic pain of left knee   8. Right lateral epicondylitis

## 2022-11-20 ENCOUNTER — Encounter: Payer: Self-pay | Admitting: Family Medicine

## 2022-11-20 ENCOUNTER — Ambulatory Visit: Payer: BC Managed Care – PPO | Admitting: Family Medicine

## 2022-11-20 VITALS — BP 118/76 | HR 88 | Temp 98.1°F | Resp 16 | Ht 74.0 in | Wt 221.8 lb

## 2022-11-20 DIAGNOSIS — Z713 Dietary counseling and surveillance: Secondary | ICD-10-CM

## 2022-11-20 DIAGNOSIS — I83892 Varicose veins of left lower extremities with other complications: Secondary | ICD-10-CM | POA: Diagnosis not present

## 2022-11-20 DIAGNOSIS — F900 Attention-deficit hyperactivity disorder, predominantly inattentive type: Secondary | ICD-10-CM

## 2022-11-20 DIAGNOSIS — G8929 Other chronic pain: Secondary | ICD-10-CM

## 2022-11-20 DIAGNOSIS — E559 Vitamin D deficiency, unspecified: Secondary | ICD-10-CM

## 2022-11-20 DIAGNOSIS — M7711 Lateral epicondylitis, right elbow: Secondary | ICD-10-CM

## 2022-11-20 DIAGNOSIS — M25562 Pain in left knee: Secondary | ICD-10-CM

## 2022-11-20 DIAGNOSIS — E663 Overweight: Secondary | ICD-10-CM

## 2022-11-20 DIAGNOSIS — M5030 Other cervical disc degeneration, unspecified cervical region: Secondary | ICD-10-CM

## 2022-11-20 MED ORDER — AMPHETAMINE-DEXTROAMPHET ER 25 MG PO CP24: 25.0000 mg | ORAL_CAPSULE | ORAL | 0 refills | Status: DC

## 2022-11-20 MED ORDER — AMPHETAMINE-DEXTROAMPHETAMINE 10 MG PO TABS: 10.0000 mg | ORAL_TABLET | Freq: Every day | ORAL | 0 refills | Status: DC

## 2022-12-06 DIAGNOSIS — I8312 Varicose veins of left lower extremity with inflammation: Secondary | ICD-10-CM | POA: Insufficient documentation

## 2022-12-06 NOTE — Progress Notes (Signed)
MRN : [id number]  Christian Hamilton is a 37 y.o. (May 10, 1986) male who presents with chief complaint of varicose veins hurt.  History of Present Illness:   The patient returns for followup evaluation 3 months after the initial visit. The patient continues to have pain in the lower extremities with dependency. The pain is lessened with elevation. Graduated compression stockings, Class I (20-30 mmHg), have been worn but the stockings do not eliminate the leg pain. Over-the-counter analgesics do not improve the symptoms. The degree of discomfort continues to interfere with daily activities. The patient notes the pain in the legs is causing problems with daily exercise, at the workplace and even with household activities and maintenance such as standing in the kitchen preparing meals and doing dishes.   Duplex ultrasound of the left venous system shows normal deep venous system with superficial reflux in the GSV   No outpatient medications have been marked as taking for the 12/09/22 encounter (Appointment) with Delana Meyer, Dolores Lory, MD.    Past Medical History:  Diagnosis Date   ADHD (attention deficit hyperactivity disorder)    Anxiety    Asthma    Back pain    DDD (degenerative disc disease), cervical    Leg varices 05/30/2015   Dr. Hulda Humphrey    Rectal bleeding     Past Surgical History:  Procedure Laterality Date   COLONOSCOPY WITH PROPOFOL N/A 07/23/2018   Procedure: COLONOSCOPY WITH PROPOFOL;  Surgeon: Lin Landsman, MD;  Location: [facility] ENDOSCOPY;  Service: Gastroenterology;  Laterality: N/A;   LEG SURGERY     SHOULDER SURGERY     TONSILECTOMY, ADENOIDECTOMY, BILATERAL MYRINGOTOMY AND TUBES     tubes in ears      Social History Social History   Tobacco Use   Smoking status: Never   Smokeless tobacco: Never  Vaping Use   Vaping Use: Never used  Substance Use Topics   Alcohol use: Yes    Alcohol/week: 6.0 standard drinks of alcohol    Types: 6 Cans of beer per week    Drug use: No    Family History Family History  Problem Relation Age of Onset   Kidney disease Father    ADD / ADHD Brother     No Known Allergies   REVIEW OF SYSTEMS (Negative unless checked)  Constitutional: []$ Weight loss  []$ Fever  []$ Chills Cardiac: []$ Chest pain   []$ Chest pressure   []$ Palpitations   []$ Shortness of breath when laying flat   []$ Shortness of breath with exertion. Vascular:  []$ Pain in legs with walking   [x]$ Pain in legs with standing  []$ History of DVT   []$ Phlebitis   []$ Swelling in legs   [x]$ Varicose veins   []$ Non-healing ulcers Pulmonary:   []$ Uses home oxygen   []$ Productive cough   []$ Hemoptysis   []$ Wheeze  []$ COPD   []$ Asthma Neurologic:  []$ Dizziness   []$ Seizures   []$ History of stroke   []$ History of TIA  []$ Aphasia   []$ Vissual changes   []$ Weakness or numbness in arm   []$ Weakness or numbness in leg Musculoskeletal:   []$ Joint swelling   []$ Joint pain   []$ Low back pain Hematologic:  []$ Easy bruising  []$ Easy bleeding   []$ Hypercoagulable state   []$ Anemic Gastrointestinal:  []$ Diarrhea   []$ Vomiting  []$ Gastroesophageal reflux/heartburn   []$ Difficulty swallowing. Genitourinary:  []$ Chronic kidney disease   []$ Difficult urination  []$ Frequent urination   []$ Blood in urine Skin:  []$ Rashes   []$ Ulcers  Psychological:  []$ History of anxiety   []$  History of major depression.  Physical Examination  There were no vitals filed for this visit. There is no height or weight on file to calculate BMI. Gen: WD/WN, NAD Head: [place]/AT, No temporalis wasting.  Ear/Nose/Throat: Hearing grossly intact, nares w/o erythema or drainage, pinna without lesions Eyes: PER, EOMI, sclera nonicteric.  Neck: Supple, no gross masses.  No JVD.  Pulmonary:  Good air movement, no audible wheezing, no use of accessory muscles.  Cardiac: RRR, precordium not hyperdynamic. Vascular:  Large varicosities present, greater than 10 mm left.  Veins are tender to palpation  Mild venous stasis changes to the legs bilaterally.  Trace  soft pitting edema CEAP C3sEpAsPr Vessel Right Left  Radial Palpable Palpable  Gastrointestinal: soft, non-distended. No guarding/no peritoneal signs.  Musculoskeletal: M/S 5/5 throughout.  No deformity.  Neurologic: CN 2-12 intact. Pain and light touch intact in extremities.  Symmetrical.  Speech is fluent. Motor exam as listed above. Psychiatric: Judgment intact, Mood & affect appropriate for pt's clinical situation. Dermatologic: Venous rashes no ulcers noted.  No changes consistent with cellulitis. Lymph : No lichenification or skin changes of chronic lymphedema.  CBC Lab Results  Component Value Date   WBC 6.4 02/18/2022   HGB 14.4 02/18/2022   HCT 42.1 02/18/2022   MCV 92.7 02/18/2022   PLT 175 02/18/2022    BMET    Component Value Date/Time   NA 139 08/20/2022 1354   K 3.9 08/20/2022 1354   CL 101 08/20/2022 1354   CO2 31 08/20/2022 1354   GLUCOSE 117 (H) 08/20/2022 1354   BUN 17 08/20/2022 1354   CREATININE 0.66 08/20/2022 1354   CALCIUM 9.1 08/20/2022 1354   GFRNONAA >60 03/05/2020 1429   GFRNONAA 115 10/11/2019 0000   GFRAA >60 03/05/2020 1429   GFRAA 133 10/11/2019 0000   CrCl cannot be calculated (Patient's most recent lab result is older than the maximum 21 days allowed.).  COAG No results found for: "INR", "PROTIME"  Radiology No results found.   Assessment/Plan 1. Varicose veins of left lower extremity with inflammation Recommend  I have reviewed my previous  discussion with the patient regarding  varicose veins and why they cause symptoms. Patient will continue  wearing graduated compression stockings class 1 on a daily basis, beginning first thing in the morning and removing them in the evening.  The patient is CEAP C3sEpAsPr.  The patient has been wearing compression for more than 12 weeks with no or little benefit.  The patient has been exercising daily for more than 12 weeks. The patient has been elevating and taking OTC pain medications for more  than 12 weeks.  None of these have have eliminated the pain related to the varicose veins and venous reflux or the discomfort regarding venous congestion.    In addition, behavioral modification including elevation during the day was again discussed and this will continue.  The patient has utilized over the counter pain medications and has been exercising.  However, at this time conservative therapy has not alleviated the patient's symptoms of leg pain and swelling  Recommend: laser ablation of the left great saphenous vein to eliminate the symptoms of pain and swelling of the lower extremities caused by the severe superficial venous reflux disease.   2. Chronic venous insufficiency No surgery or intervention at this point in time.   The patient is CEAP C4sEpAsPr   I have discussed with the patient venous insufficiency and why it  causes symptoms. I have discussed with the patient the chronic skin changes that  accompany venous insufficiency and the long term sequela such as infection and ulceration.  Patient will begin wearing graduated compression stockings or compression wraps on a daily basis.  The patient will put the compression on first thing in the morning and removing them in the evening. The patient is instructed specifically not to sleep in the compression.    In addition, behavioral modification including several periods of elevation of the lower extremities during the day will be continued. I have demonstrated that proper elevation is a position with the ankles at heart level.  The patient is instructed to begin routine exercise, especially walking on a daily basis   3. DDD (degenerative disc disease), cervical Continue NSAID medications as already ordered, these medications have been reviewed and there are no changes at this time.  Continued activity and therapy was stressed.    Hortencia Pilar, MD  12/06/2022 12:25 PM

## 2022-12-09 ENCOUNTER — Encounter (INDEPENDENT_AMBULATORY_CARE_PROVIDER_SITE_OTHER): Payer: Self-pay | Admitting: Vascular Surgery

## 2022-12-09 ENCOUNTER — Ambulatory Visit (INDEPENDENT_AMBULATORY_CARE_PROVIDER_SITE_OTHER): Payer: BC Managed Care – PPO | Admitting: Vascular Surgery

## 2022-12-09 VITALS — BP 135/93 | HR 79 | Resp 18 | Ht 75.0 in | Wt 222.0 lb

## 2022-12-09 DIAGNOSIS — I872 Venous insufficiency (chronic) (peripheral): Secondary | ICD-10-CM

## 2022-12-09 DIAGNOSIS — M5030 Other cervical disc degeneration, unspecified cervical region: Secondary | ICD-10-CM

## 2022-12-09 DIAGNOSIS — I8312 Varicose veins of left lower extremity with inflammation: Secondary | ICD-10-CM | POA: Diagnosis not present

## 2022-12-12 ENCOUNTER — Encounter (INDEPENDENT_AMBULATORY_CARE_PROVIDER_SITE_OTHER): Payer: Self-pay | Admitting: Vascular Surgery

## 2023-02-18 NOTE — Progress Notes (Unsigned)
Name: Christian Hamilton   MRN: [id number]    DOB: October 12, 1986   Date:02/19/2023       Progress Note  Subjective  Chief Complaint  Follow Up  HPI  ADHD: he is currently taking Adderal XR 25 mg, working 12 hour days , takes 10 mg around lunch time to last all day.  He states medication works well for him and denies side effects Medication helps him focus and be productive at work. Refills sent today   Overweight : BMI is now below 30, his heaviest weight was over 300 lbs after his first son was born, he went down to 4 's lbs in 2021 and since October 2022  he started a low carb diet, intermittent fasting and has been gradually losing weight.  His weight is stable now between 220 - 221 lbs   Left knee pain and instability: seen by Ortho in the past, was given meloxicam without help. He states he has a meniscal tear, sometimes is swells and sometimes it locks. Doing much better with weight loss. He has been exercising more at home   DDD cervical: he was having symptoms radiculitis down right arm, it was severe in the Fall 22 and was keeping him awake at night, I gave him Lyrica but he decided to just stretch at home and is doing better. Unchanged   Lateral epicondylitis right side : started a couple of months ago, it started in the fall while throwing discs and a football, certain movements causes it to flare but not a constant pain, taking nsaid's prn only Stable  Sore throat: he developed sore throat about one month ago, not sure if he was sick at the time, but he has been feeling tired since and has noticed some discomfort, like throat is dry intermittently since. No heartburn. Discussed GERD or mono, we will check labs, can try otc gerd medication . We will also check labs   Varicose: seen by vascular surgeon in the past and had laser treatment  but symptoms are back.  He has been wearing compression stocking hoses, he l wants to go back to have  laser treatment again  he will contact them back    Vitamin D deficiency: he needs to resume supplementation   Patient Active Problem List   Diagnosis Date Noted   Varicose veins of left lower extremity with inflammation 12/06/2022   Varicose veins with inflammation 09/09/2022   Overweight (BMI 25.0-29.9) 08/20/2022   Vitamin D deficiency 08/20/2022   Ketogenic diet monitoring encounter 08/20/2022   DDD (degenerative disc disease), cervical 11/04/2017   Varicocele 07/25/2016   Neck pain 10/12/2015   Panic attack as reaction to stress 06/02/2015   ADD (attention deficit disorder) 05/30/2015   Back pain, chronic 05/30/2015   Chronic venous insufficiency 05/30/2015   Abnormal antinuclear antibody titer 05/30/2015   Varicose veins of left leg with edema 05/30/2015   Migraine without aura and responsive to treatment 05/10/2009   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   Obesity (BMI 30-39.9) 08/08/2008    Past Surgical History:  Procedure Laterality Date   COLONOSCOPY WITH PROPOFOL N/A 07/23/2018   Procedure: COLONOSCOPY WITH PROPOFOL;  Surgeon: Toney Reil, MD;  Location: [facility] ENDOSCOPY;  Service: Gastroenterology;  Laterality: N/A;   LEG SURGERY     SHOULDER SURGERY     TONSILECTOMY, ADENOIDECTOMY, BILATERAL MYRINGOTOMY AND TUBES     tubes in ears      Family History  Problem Relation Age of Onset  Kidney disease Father    ADD / ADHD Brother     Social History   Tobacco Use   Smoking status: Never   Smokeless tobacco: Never  Substance Use Topics   Alcohol use: Yes    Alcohol/week: 6.0 standard drinks of alcohol    Types: 6 Cans of beer per week     Current Outpatient Medications:    amphetamine-dextroamphetamine (ADDERALL XR) 25 MG 24 hr capsule, Take 1 capsule by mouth every morning., Disp: 90 capsule, Rfl: 0   amphetamine-dextroamphetamine (ADDERALL) 10 MG tablet, Take 1 tablet (10 mg total) by mouth daily at 12 noon., Disp: 90 tablet, Rfl: 0   Cholecalciferol (VITAMIN D) 50 MCG (2000 UT)  CAPS, Take 1 capsule by mouth daily at 12 noon., Disp: , Rfl:   No Known Allergies  I personally reviewed active problem list, medication list, allergies, family history, social history, health maintenance with the patient/caregiver today.   ROS  Ten systems reviewed and is negative except as mentioned in HPI   Objective  Vitals:   02/19/23 1307  BP: 132/86  Pulse: 88  Resp: 16  Temp: 97.8 F (36.6 C)  TempSrc: Oral  SpO2: 98%  Weight: 220 lb 9.6 oz (100.1 kg)  Height: 6\' 3"  (1.905 m)    Body mass index is 27.57 kg/m.  Physical Exam  Constitutional: Patient appears well-developed and well-nourished. Obese  No distress.  HEENT: head atraumatic, normocephalic, pupils equal and reactive to light, neck supple Cardiovascular: Normal rate, regular rhythm and normal heart sounds.  No murmur heard. No BLE edema. Pulmonary/Chest: Effort normal and breath sounds normal. No respiratory distress. Abdominal: Soft.  There is no tenderness. Psychiatric: Patient has a normal mood and affect. behavior is normal. Judgment and thought content normal.    PHQ2/9:    02/19/2023    1:01 PM 11/20/2022    1:23 PM 08/20/2022    1:21 PM 07/03/2022   12:52 PM 05/17/2022    1:23 PM  Depression screen PHQ 2/9  Decreased Interest 0 0 0 0 0  Down, Depressed, Hopeless 0 0 0 0 0  PHQ - 2 Score 0 0 0 0 0  Altered sleeping 0 0 0 0 0  Tired, decreased energy 0 0 0 0 0  Change in appetite 0 0 0 0 0  Feeling bad or failure about yourself  0 0 0 0 0  Trouble concentrating 0 0 0 0 0  Moving slowly or fidgety/restless 0 0 0 0 0  Suicidal thoughts 0 0 0 0 0  PHQ-9 Score 0 0 0 0 0  Difficult doing work/chores Not difficult at all        phq 9 is negative   Fall Risk:    02/19/2023    1:01 PM 11/20/2022    1:23 PM 08/20/2022    1:20 PM 07/03/2022   12:59 PM 05/17/2022    1:23 PM  Fall Risk   Falls in the past year? 0 0 0 0 0  Number falls in past yr: 0      Injury with Fall? 0      Risk for fall  due to : No Fall Risks No Fall Risks No Fall Risks No Fall Risks No Fall Risks  Follow up Falls prevention discussed;Education provided;Falls evaluation completed Falls prevention discussed Falls prevention discussed;Education provided;Falls evaluation completed Falls prevention discussed;Education provided;Falls evaluation completed Falls prevention discussed      Functional Status Survey: Is the patient deaf or have difficulty  hearing?: No Does the patient have difficulty seeing, even when wearing glasses/contacts?: No Does the patient have difficulty concentrating, remembering, or making decisions?: No Does the patient have difficulty walking or climbing stairs?: No Does the patient have difficulty dressing or bathing?: No Does the patient have difficulty doing errands alone such as visiting a doctor's office or shopping?: No    Assessment & Plan  1. Attention deficit hyperactivity disorder (ADHD), predominantly inattentive type  - amphetamine-dextroamphetamine (ADDERALL) 10 MG tablet; Take 1 tablet (10 mg total) by mouth daily at 12 noon.  Dispense: 90 tablet; Refill: 0 - amphetamine-dextroamphetamine (ADDERALL XR) 25 MG 24 hr capsule; Take 1 capsule by mouth every morning.  Dispense: 90 capsule; Refill: 0  2. Varicose veins of left leg with edema  He states he has been waiting for the laser   3. Vitamin D deficiency  - VITAMIN D 25 Hydroxy (Vit-D Deficiency, Fractures)  4. Overweight (BMI 25.0-29.9)  Still on low carb diet   5. Chronic pain of left knee  Stable  6. Other fatigue  - CBC with Differential/Platelet - COMPLETE METABOLIC PANEL WITH GFR - TSH - Vitamin B12 - Testosterone,Free and Total  7. Diabetes mellitus screening  - Hemoglobin A1c  8. Sore throat  - Epstein-Barr virus VCA antibody panel

## 2023-02-19 ENCOUNTER — Ambulatory Visit (INDEPENDENT_AMBULATORY_CARE_PROVIDER_SITE_OTHER): Payer: BC Managed Care – PPO | Admitting: Family Medicine

## 2023-02-19 ENCOUNTER — Encounter: Payer: Self-pay | Admitting: Family Medicine

## 2023-02-19 VITALS — BP 132/86 | HR 88 | Temp 97.8°F | Resp 16 | Ht 75.0 in | Wt 220.6 lb

## 2023-02-19 DIAGNOSIS — E663 Overweight: Secondary | ICD-10-CM | POA: Diagnosis not present

## 2023-02-19 DIAGNOSIS — Z131 Encounter for screening for diabetes mellitus: Secondary | ICD-10-CM

## 2023-02-19 DIAGNOSIS — F900 Attention-deficit hyperactivity disorder, predominantly inattentive type: Secondary | ICD-10-CM

## 2023-02-19 DIAGNOSIS — E559 Vitamin D deficiency, unspecified: Secondary | ICD-10-CM

## 2023-02-19 DIAGNOSIS — I83892 Varicose veins of left lower extremities with other complications: Secondary | ICD-10-CM

## 2023-02-19 DIAGNOSIS — M25562 Pain in left knee: Secondary | ICD-10-CM

## 2023-02-19 DIAGNOSIS — R5383 Other fatigue: Secondary | ICD-10-CM

## 2023-02-19 DIAGNOSIS — J029 Acute pharyngitis, unspecified: Secondary | ICD-10-CM

## 2023-02-19 DIAGNOSIS — G8929 Other chronic pain: Secondary | ICD-10-CM

## 2023-02-19 MED ORDER — AMPHETAMINE-DEXTROAMPHET ER 25 MG PO CP24: 25.0000 mg | ORAL_CAPSULE | ORAL | 0 refills | Status: DC

## 2023-02-19 MED ORDER — AMPHETAMINE-DEXTROAMPHETAMINE 10 MG PO TABS: 10.0000 mg | ORAL_TABLET | Freq: Every day | ORAL | 0 refills | Status: DC

## 2023-02-26 ENCOUNTER — Encounter (INDEPENDENT_AMBULATORY_CARE_PROVIDER_SITE_OTHER): Payer: Self-pay | Admitting: Vascular Surgery

## 2023-02-26 ENCOUNTER — Ambulatory Visit: Payer: BC Managed Care – PPO | Admitting: Dermatology

## 2023-02-26 VITALS — BP 137/93

## 2023-02-26 DIAGNOSIS — L814 Other melanin hyperpigmentation: Secondary | ICD-10-CM

## 2023-02-26 DIAGNOSIS — L821 Other seborrheic keratosis: Secondary | ICD-10-CM

## 2023-02-26 DIAGNOSIS — L578 Other skin changes due to chronic exposure to nonionizing radiation: Secondary | ICD-10-CM | POA: Diagnosis not present

## 2023-02-26 DIAGNOSIS — I8310 Varicose veins of unspecified lower extremity with inflammation: Secondary | ICD-10-CM

## 2023-02-26 DIAGNOSIS — D239 Other benign neoplasm of skin, unspecified: Secondary | ICD-10-CM

## 2023-02-26 DIAGNOSIS — D225 Melanocytic nevi of trunk: Secondary | ICD-10-CM

## 2023-02-26 DIAGNOSIS — D485 Neoplasm of uncertain behavior of skin: Secondary | ICD-10-CM

## 2023-02-26 DIAGNOSIS — D229 Melanocytic nevi, unspecified: Secondary | ICD-10-CM | POA: Diagnosis not present

## 2023-02-26 DIAGNOSIS — Z1283 Encounter for screening for malignant neoplasm of skin: Secondary | ICD-10-CM

## 2023-02-26 HISTORY — DX: Other benign neoplasm of skin, unspecified: D23.9

## 2023-02-26 NOTE — Patient Instructions (Addendum)
Shave Excision Benign Lesion Wound Care Instructions  Leave the original bandage on for 24 hours if possible.  If the bandage becomes soaked or soiled before that time, it is OK to remove it and examine the wound.  A small amount of post-operative bleeding is normal.  If excessive bleeding occurs, remove the bandage, place gauze over the site and apply continuous pressure (no peeking) over the area for 20-30 minutes.  If this does not stop the bleeding, try again for 40 minutes.  If this does not work, please call our clinic as soon as possible (even if after-hours).    Twice a day, cleanse the wound with soap and water.  If a thick crust develops you may use a Q-tip dipped into dilute hydrogen peroxide (mix 1:1 with water) to dissolve it.  Hydrogen peroxide can slow the healing process, so use it only as needed.  After washing, apply Vaseline jelly or Polysporin ointment.  For best healing, the wound should be covered with a layer of ointment at all times.  This may mean re-applying the ointment several times a day.  For open wounds, continue until it has healed.    If you have any swelling, keep the area elevated.  Some redness, tenderness and white or yellow material in the wound is normal healing.  If the area becomes very sore and red, or develops a thick yellow-green material (pus), it may be infected; please notify us.    Wound healing continues for up to one year following surgery.  It is not unusual to experience pain in the scar from time to time during the interval.  If the pain becomes severe or the scar thickens, you should notify the office.  A slight amount of redness in a scar is expected for the first six months.  After six months, the redness subsides and the scar will soften and fade.  The color difference becomes less noticeable with time.  If there are any problems, return for a post-op surgery check at your earliest convenience.  Please call our office for any questions or  concerns.  Due to recent changes in healthcare laws, you may see results of your pathology and/or laboratory studies on MyChart before the doctors have had a chance to review them. We understand that in some cases there may be results that are confusing or concerning to you. Please understand that not all results are received at the same time and often the doctors may need to interpret multiple results in order to provide you with the best plan of care or course of treatment. Therefore, we ask that you please give us 2 business days to thoroughly review all your results before contacting the office for clarification. Should we see a critical lab result, you will be contacted sooner.   If You Need Anything After Your Visit  If you have any questions or concerns for your doctor, please call our main line at [phone] and press option 4 to reach your doctor's medical assistant. If no one answers, please leave a voicemail as directed and we will return your call as soon as possible. Messages left after 4 pm will be answered the following business day.   You may also send us a message via MyChart. We typically respond to MyChart messages within 1-2 business days.  For prescription refills, please ask your pharmacy to contact our office. Our fax number is [phone].  If you have an urgent issue when the clinic is closed that   cannot wait until the next business day, you can page your doctor at the number below.    Please note that while we do our best to be available for urgent issues outside of office hours, we are not available 24/7.   If you have an urgent issue and are unable to reach us, you may choose to seek medical care at your doctor's office, retail clinic, urgent care center, or emergency room.  If you have a medical emergency, please immediately call 911 or go to the emergency department.  Pager Numbers  - Dr. Kowalski: [phone]  - Dr. Moye: [phone]  - Dr. Stewart:  [phone]  In the event of inclement weather, please call our main line at [phone] for an update on the status of any delays or closures.  Dermatology Medication Tips: Please keep the boxes that topical medications come in in order to help keep track of the instructions about where and how to use these. Pharmacies typically print the medication instructions only on the boxes and not directly on the medication tubes.   If your medication is too expensive, please contact our office at [phone] option 4 or send us a message through MyChart.   We are unable to tell what your co-pay for medications will be in advance as this is different depending on your insurance coverage. However, we may be able to find a substitute medication at lower cost or fill out paperwork to get insurance to cover a needed medication.   If a prior authorization is required to get your medication covered by your insurance company, please allow us 1-2 business days to complete this process.  Drug prices often vary depending on where the prescription is filled and some pharmacies may offer cheaper prices.  The website www.goodrx.com contains coupons for medications through different pharmacies. The prices here do not account for what the cost may be with help from insurance (it may be cheaper with your insurance), but the website can give you the price if you did not use any insurance.  - You can print the associated coupon and take it with your prescription to the pharmacy.  - You may also stop by our office during regular business hours and pick up a GoodRx coupon card.  - If you need your prescription sent electronically to a different pharmacy, notify our office through [facility] MyChart or by phone at [phone] option 4.     Si Usted Necesita Algo Despus de Su Visita  Tambin puede enviarnos un mensaje a travs de MyChart. Por lo general respondemos a los mensajes de MyChart en el transcurso de 1 a 2  das hbiles.  Para renovar recetas, por favor pida a su farmacia que se ponga en contacto con nuestra oficina. Nuestro nmero de fax es el [phone].  Si tiene un asunto urgente cuando la clnica est cerrada y que no puede esperar hasta el siguiente da hbil, puede llamar/localizar a su doctor(a) al nmero que aparece a continuacin.   Por favor, tenga en cuenta que aunque hacemos todo lo posible para estar disponibles para asuntos urgentes fuera del horario de oficina, no estamos disponibles las 24 horas del da, los 7 das de la semana.   Si tiene un problema urgente y no puede comunicarse con nosotros, puede optar por buscar atencin mdica  en el consultorio de su doctor(a), en una clnica privada, en un centro de atencin urgente o en una sala de emergencias.  Si tiene una emergencia mdica, por favor   llame inmediatamente al 911 o vaya a la sala de emergencias.  Nmeros de bper  - Dr. Kowalski: [phone]  - Dra. Moye: [phone]  - Dra. Stewart: [phone]  En caso de inclemencias del tiempo, por favor llame a nuestra lnea principal al [phone] para una actualizacin sobre el estado de cualquier retraso o cierre.  Consejos para la medicacin en dermatologa: Por favor, guarde las cajas en las que vienen los medicamentos de uso tpico para ayudarle a seguir las instrucciones sobre dnde y cmo usarlos. Las farmacias generalmente imprimen las instrucciones del medicamento slo en las cajas y no directamente en los tubos del medicamento.   Si su medicamento es muy caro, por favor, pngase en contacto con nuestra oficina llamando al [phone] y presione la opcin 4 o envenos un mensaje a travs de MyChart.   No podemos decirle cul ser su copago por los medicamentos por adelantado ya que esto es diferente dependiendo de la cobertura de su seguro. Sin embargo, es posible que podamos encontrar un medicamento sustituto a menor costo o llenar un formulario para que el  seguro cubra el medicamento que se considera necesario.   Si se requiere una autorizacin previa para que su compaa de seguros cubra su medicamento, por favor permtanos de 1 a 2 das hbiles para completar este proceso.  Los precios de los medicamentos varan con frecuencia dependiendo del lugar de dnde se surte la receta y alguna farmacias pueden ofrecer precios ms baratos.  El sitio web www.goodrx.com tiene cupones para medicamentos de diferentes farmacias. Los precios aqu no tienen en cuenta lo que podra costar con la ayuda del seguro (puede ser ms barato con su seguro), pero el sitio web puede darle el precio si no utiliz ningn seguro.  - Puede imprimir el cupn correspondiente y llevarlo con su receta a la farmacia.  - Tambin puede pasar por nuestra oficina durante el horario de atencin regular y recoger una tarjeta de cupones de GoodRx.  - Si necesita que su receta se enve electrnicamente a una farmacia diferente, informe a nuestra oficina a travs de MyChart de [facility] o por telfono llamando al [phone] y presione la opcin 4.  

## 2023-02-26 NOTE — Telephone Encounter (Signed)
Tanya,   Could you please reach out to the patient with an update. Thanks

## 2023-02-26 NOTE — Progress Notes (Signed)
New Patient Visit   Subjective  Christian Hamilton is a 37 y.o. male who presents for the following: Skin Cancer Screening and Full Body Skin Exam - Family history of Melanoma in maternal grandmother  The patient presents for Total-Body Skin Exam (TBSE) for skin cancer screening and mole check. The patient has spots, moles and lesions to be evaluated, some may be new or changing and the patient has concerns that these could be cancer.    The following portions of the chart were reviewed this encounter and updated as appropriate: medications, allergies, medical history  Review of Systems:  No other skin or systemic complaints except as noted in HPI or Assessment and Plan.  Objective  Well appearing patient in no apparent distress; mood and affect are within normal limits.  A full examination was performed including scalp, head, eyes, ears, nose, lips, neck, chest, axillae, abdomen, back, buttocks, bilateral upper extremities, bilateral lower extremities, hands, feet, fingers, toes, fingernails, and toenails. All findings within normal limits unless otherwise noted below.   Relevant physical exam findings are noted in the Assessment and Plan.  Right Lower Back Irregular brown macule 0.4 cm         Assessment & Plan   FAMILY HISTORY OF SKIN CANCER What type(s): Melanoma Who affected: Maternal grandmother  LENTIGINES, SEBORRHEIC KERATOSES, HEMANGIOMAS - Benign normal skin lesions - Benign-appearing - Call for any changes  MELANOCYTIC NEVI - Tan-brown and/or pink-flesh-colored symmetric macules and papules - Benign appearing on exam today - Observation - Call clinic for new or changing moles - Recommend daily use of broad spectrum spf 30+ sunscreen to sun-exposed areas.   ACTINIC DAMAGE - Chronic condition, secondary to cumulative UV/sun exposure - diffuse scaly erythematous macules with underlying dyspigmentation - Recommend daily broad spectrum sunscreen SPF 30+ to  sun-exposed areas, reapply every 2 hours as needed.  - Staying in the shade or wearing long sleeves, sun glasses (UVA+UVB protection) and wide brim hats (4-inch brim around the entire circumference of the hat) are also recommended for sun protection.  - Call for new or changing lesions.  Varicose Veins/Spider Veins with Schamberg's Purpura - Dilated blue, purple or red veins at the lower extremities - Reassured - Smaller vessels can be treated by sclerotherapy (a procedure to inject a medicine into the veins to make them disappear) if desired, but the treatment is not covered by insurance. Larger vessels may be covered if symptomatic and we would refer to vascular surgeon if treatment desired.  SKIN CANCER SCREENING PERFORMED TODAY.    Neoplasm of uncertain behavior of skin Right Lower Back  Epidermal / dermal shaving  Lesion diameter (cm):  0.4 Informed consent: discussed and consent obtained   Timeout: patient name, date of birth, surgical site, and procedure verified   Procedure prep:  Patient was prepped and draped in usual sterile fashion Prep type:  Isopropyl alcohol Anesthesia: the lesion was anesthetized in a standard fashion   Anesthetic:  1% lidocaine w/ epinephrine 1-100,000 buffered w/ 8.4% NaHCO3 Instrument used: flexible razor blade   Hemostasis achieved with: pressure, aluminum chloride and electrodesiccation   Outcome: patient tolerated procedure well   Post-procedure details: sterile dressing applied and wound care instructions given   Dressing type: bandage and petrolatum    Specimen 1 - Surgical pathology Differential Diagnosis: Nevus vs dysplastic nevus Check Margins: No    Return if symptoms worsen or fail to improve.  I, Joanie Coddington, CMA, am acting as scribe for Armida Sans, MD .  Documentation: I have reviewed the above documentation for accuracy and completeness, and I agree with the above.  Sarina Ser, MD

## 2023-03-04 ENCOUNTER — Encounter: Payer: Self-pay | Admitting: Dermatology

## 2023-03-06 ENCOUNTER — Telehealth: Payer: Self-pay

## 2023-03-06 NOTE — Telephone Encounter (Signed)
Left voicemail on machine to return my call.  

## 2023-03-06 NOTE — Telephone Encounter (Signed)
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

## 2023-03-11 ENCOUNTER — Telehealth: Payer: Self-pay

## 2023-03-11 NOTE — Telephone Encounter (Signed)
Left voicemail to return my call

## 2023-03-11 NOTE — Telephone Encounter (Signed)
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 

## 2023-03-12 ENCOUNTER — Telehealth: Payer: Self-pay

## 2023-03-12 NOTE — Telephone Encounter (Signed)
Left message on voicemail to return my call.  

## 2023-03-12 NOTE — Telephone Encounter (Signed)
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 

## 2023-05-21 ENCOUNTER — Ambulatory Visit: Payer: BC Managed Care – PPO | Admitting: Family Medicine

## 2023-05-26 ENCOUNTER — Other Ambulatory Visit (INDEPENDENT_AMBULATORY_CARE_PROVIDER_SITE_OTHER): Payer: Self-pay

## 2023-05-26 MED ORDER — ALPRAZOLAM 0.5 MG PO TABS: ORAL_TABLET | ORAL | 0 refills | Status: DC

## 2023-05-27 NOTE — Progress Notes (Unsigned)
Name: Christian Hamilton   MRN: [id number]    DOB: Aug 14, 1986   Date:05/28/2023       Progress Note  Subjective  Chief Complaint  Medication Refill  HPI  ADHD: he is currently taking Adderal XR 25 mg, working 12 hour days , takes 10 mg around lunch time to last all day.  He states schedule is changing and the 25 mg ER seems to make jittery now, he has tried taking 10 mg BID and seems to work better, we will change rx so he can skip second dose if not needed.   Overweight : BMI is now below 30, his heaviest weight was over 300 lbs after his first son was born, he went down to 17 's lbs in 2021 and since October 2022  he started a low carb diet, intermittent fasting and weight is now stable between 220 - 221 lbs   Left knee pain and instability: seen by Ortho in the past, was given meloxicam without help. He states he has a meniscal tear, sometimes is swells and sometimes it locks. Doing much better with weight loss.   DDD cervical: he was having symptoms radiculitis down right arm, it was severe in the Fall 22 and was keeping him awake at night, I gave him Lyrica but he decided to just stretch at home and is doing better. Unchanged   Varicose: seen by vascular surgeon in the past and had laser treatment  but symptoms are back.  He has been wearing compression stocking hoses, he l wants is going back in Nov for laser procedure   Vitamin D deficiency: he is due for labs   Patient Active Problem List   Diagnosis Date Noted   Varicose veins of left lower extremity with inflammation 12/06/2022   Varicose veins with inflammation 09/09/2022   Overweight (BMI 25.0-29.9) 08/20/2022   Vitamin D deficiency 08/20/2022   Ketogenic diet monitoring encounter 08/20/2022   DDD (degenerative disc disease), cervical 11/04/2017   Varicocele 07/25/2016   Neck pain 10/12/2015   Panic attack as reaction to stress 06/02/2015   ADD (attention deficit disorder) 05/30/2015   Back pain, chronic 05/30/2015   Chronic  venous insufficiency 05/30/2015   Abnormal antinuclear antibody titer 05/30/2015   Varicose veins of left leg with edema 05/30/2015   Migraine without aura and responsive to treatment 05/10/2009   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   Obesity (BMI 30-39.9) 08/08/2008    Past Surgical History:  Procedure Laterality Date   COLONOSCOPY WITH PROPOFOL N/A 07/23/2018   Procedure: COLONOSCOPY WITH PROPOFOL;  Surgeon: Toney Reil, MD;  Location[facility] ENDOSCOPY;  Service: Gastroenterology;  Laterality: N/A;   LEG SURGERY     SHOULDER SURGERY     TONSILECTOMY, ADENOIDECTOMY, BILATERAL MYRINGOTOMY AND TUBES     tubes in ears      Family History  Problem Relation Age of Onset   Kidney disease Father    ADD / ADHD Brother     Social History   Tobacco Use   Smoking status: Never   Smokeless tobacco: Never  Substance Use Topics   Alcohol use: Yes    Alcohol/week: 6.0 standard drinks of alcohol    Types: 6 Cans of beer per week     Current Outpatient Medications:    [START ON 07/05/2023] ALPRAZolam (XANAX) 0.5 MG tablet, Take 1 tab one hour before procedure and 1 tab when you arrive in office, Disp: 2 tablet, Rfl: 0   amphetamine-dextroamphetamine (ADDERALL XR)  25 MG 24 hr capsule, Take 1 capsule by mouth every morning., Disp: 90 capsule, Rfl: 0   amphetamine-dextroamphetamine (ADDERALL) 10 MG tablet, Take 1 tablet (10 mg total) by mouth daily at 12 noon., Disp: 90 tablet, Rfl: 0   Cholecalciferol (VITAMIN D) 50 MCG (2000 UT) CAPS, Take 1 capsule by mouth daily at 12 noon., Disp: , Rfl:   No Known Allergies  I personally reviewed active problem list, medication list, allergies, family history, social history, health maintenance with the patient/caregiver today.   ROS  Constitutional: Negative for fever or weight change.  Respiratory: Negative for cough and shortness of breath.   Cardiovascular: Negative for chest pain or palpitations.  Gastrointestinal: Negative  for abdominal pain, no bowel changes.  Musculoskeletal: Negative for gait problem or joint swelling.  Skin: Negative for rash.  Neurological: Negative for dizziness or headache.  No other specific complaints in a complete review of systems (except as listed in HPI above).   Objective  Vitals:   05/28/23 1349  BP: 120/80  Pulse: 98  Resp: 14  Temp: 98 F (36.7 C)  TempSrc: Oral  SpO2: 98%  Weight: 222 lb 14.4 oz (101.1 kg)  Height: 6\' 3"  (1.905 m)    Body mass index is 27.86 kg/m.  Physical Exam  Constitutional: Patient appears well-developed and well-nourished. No distress.  HEENT: head atraumatic, normocephalic, pupils equal and reactive to light, neck supple Cardiovascular: Normal rate, regular rhythm and normal heart sounds.  No murmur heard. No BLE edema. Pulmonary/Chest: Effort normal and breath sounds normal. No respiratory distress. Abdominal: Soft.  There is no tenderness. Psychiatric: Patient has a normal mood and affect. behavior is normal. Judgment and thought content normal.     PHQ2/9:    05/28/2023    1:53 PM 02/19/2023    1:01 PM 11/20/2022    1:23 PM 08/20/2022    1:21 PM 07/03/2022   12:52 PM  Depression screen PHQ 2/9  Decreased Interest 0 0 0 0 0  Down, Depressed, Hopeless 0 0 0 0 0  PHQ - 2 Score 0 0 0 0 0  Altered sleeping 0 0 0 0 0  Tired, decreased energy 0 0 0 0 0  Change in appetite 0 0 0 0 0  Feeling bad or failure about yourself  0 0 0 0 0  Trouble concentrating 0 0 0 0 0  Moving slowly or fidgety/restless 0 0 0 0 0  Suicidal thoughts 0 0 0 0 0  PHQ-9 Score 0 0 0 0 0  Difficult doing work/chores  Not difficult at all       phq 9 is negative   Fall Risk:    05/28/2023    1:53 PM 02/19/2023    1:01 PM 11/20/2022    1:23 PM 08/20/2022    1:20 PM 07/03/2022   12:59 PM  Fall Risk   Falls in the past year? 0 0 0 0 0  Number falls in past yr:  0     Injury with Fall?  0     Risk for fall due to : No Fall Risks No Fall Risks No Fall  Risks No Fall Risks No Fall Risks  Follow up Falls prevention discussed Falls prevention discussed;Education provided;Falls evaluation completed Falls prevention discussed Falls prevention discussed;Education provided;Falls evaluation completed Falls prevention discussed;Education provided;Falls evaluation completed      Functional Status Survey: Is the patient deaf or have difficulty hearing?: No Does the patient have difficulty seeing, even when wearing  glasses/contacts?: No Does the patient have difficulty concentrating, remembering, or making decisions?: No Does the patient have difficulty walking or climbing stairs?: No Does the patient have difficulty dressing or bathing?: No Does the patient have difficulty doing errands alone such as visiting a doctor's office or shopping?: No    Assessment & Plan  1. Attention deficit hyperactivity disorder (ADHD), predominantly inattentive type  - amphetamine-dextroamphetamine (ADDERALL) 10 MG tablet; Take 1 tablet (10 mg total) by mouth 2 (two) times daily with a meal.  Dispense: 180 tablet; Refill: 0  2. Vitamin D deficiency   Continue supplementation   3. Overweight (BMI 25.0-29.9)  Stable   4. Varicose veins of left leg with edema  Going to see vascular surgeon in Nov  5. Ketogenic diet monitoring encounter   He will have labs done today

## 2023-05-28 ENCOUNTER — Ambulatory Visit: Payer: BC Managed Care – PPO | Admitting: Family Medicine

## 2023-05-28 VITALS — BP 120/80 | HR 98 | Temp 98.0°F | Resp 14 | Ht 75.0 in | Wt 222.9 lb

## 2023-05-28 DIAGNOSIS — I83892 Varicose veins of left lower extremities with other complications: Secondary | ICD-10-CM | POA: Diagnosis not present

## 2023-05-28 DIAGNOSIS — E663 Overweight: Secondary | ICD-10-CM

## 2023-05-28 DIAGNOSIS — F900 Attention-deficit hyperactivity disorder, predominantly inattentive type: Secondary | ICD-10-CM

## 2023-05-28 DIAGNOSIS — R5383 Other fatigue: Secondary | ICD-10-CM | POA: Diagnosis not present

## 2023-05-28 DIAGNOSIS — E559 Vitamin D deficiency, unspecified: Secondary | ICD-10-CM

## 2023-05-28 DIAGNOSIS — Z713 Dietary counseling and surveillance: Secondary | ICD-10-CM

## 2023-05-28 DIAGNOSIS — Z131 Encounter for screening for diabetes mellitus: Secondary | ICD-10-CM | POA: Diagnosis not present

## 2023-05-28 MED ORDER — AMPHETAMINE-DEXTROAMPHETAMINE 10 MG PO TABS: 10.0000 mg | ORAL_TABLET | Freq: Two times a day (BID) | ORAL | 0 refills | Status: DC

## 2023-06-25 DIAGNOSIS — D3121 Benign neoplasm of right retina: Secondary | ICD-10-CM | POA: Diagnosis not present

## 2023-06-25 DIAGNOSIS — D3122 Benign neoplasm of left retina: Secondary | ICD-10-CM | POA: Diagnosis not present

## 2023-07-08 NOTE — Progress Notes (Signed)
MRN : [id number]  Christian Hamilton is a 37 y.o. (13-May-1986) male who presents with chief complaint of No chief complaint on file. .    The patient's left lower extremity was sterilely prepped and draped.  The ultrasound machine was used to visualize the left saphenous vein throughout its course.  A segment below the knee was selected for access.  The saphenous vein was accessed without difficulty using ultrasound guidance with a micropuncture needle.   An 0.018  wire was placed beyond the saphenofemoral junction through the sheath and the microneedle was removed.  The 65 cm sheath was then placed over the wire and the wire and dilator were removed.  The laser fiber was placed through the sheath and its tip was placed approximately 2 cm below the saphenofemoral junction.  Tumescent anesthesia was then created with a dilute lidocaine solution.  Laser energy was then delivered with constant withdrawal of the sheath and laser fiber.  Approximately 1881 Joules of energy were delivered over a length of 38 cm.  Sterile dressings were placed.  The patient tolerated the procedure well without complications.

## 2023-07-10 ENCOUNTER — Ambulatory Visit (INDEPENDENT_AMBULATORY_CARE_PROVIDER_SITE_OTHER): Payer: BC Managed Care – PPO | Admitting: Vascular Surgery

## 2023-07-10 ENCOUNTER — Other Ambulatory Visit (INDEPENDENT_AMBULATORY_CARE_PROVIDER_SITE_OTHER): Payer: Self-pay | Admitting: Vascular Surgery

## 2023-07-10 ENCOUNTER — Encounter (INDEPENDENT_AMBULATORY_CARE_PROVIDER_SITE_OTHER): Payer: Self-pay | Admitting: Vascular Surgery

## 2023-07-10 VITALS — BP 113/79 | HR 85 | Resp 16 | Wt 220.0 lb

## 2023-07-10 DIAGNOSIS — I8312 Varicose veins of left lower extremity with inflammation: Secondary | ICD-10-CM

## 2023-07-10 NOTE — Progress Notes (Signed)
Name: Christian Hamilton   MRN: [id number]    DOB: 08/25/1986   Date:07/11/2023       Progress Note  Subjective  Chief Complaint  Annual Exam  HPI  Patient presents for annual CPE.  Diet: balanced diet  Exercise: continue regular physical activity  Last Dental Exam: up to date  Last Eye Exam: up to date   Depression: phq 9 is negative    07/11/2023    2:34 PM 05/28/2023    1:53 PM 02/19/2023    1:01 PM 11/20/2022    1:23 PM 08/20/2022    1:21 PM  Depression screen PHQ 2/9  Decreased Interest 0 0 0 0 0  Down, Depressed, Hopeless 0 0 0 0 0  PHQ - 2 Score 0 0 0 0 0  Altered sleeping 0 0 0 0 0  Tired, decreased energy 0 0 0 0 0  Change in appetite 0 0 0 0 0  Feeling bad or failure about yourself  0 0 0 0 0  Trouble concentrating 0 0 0 0 0  Moving slowly or fidgety/restless 0 0 0 0 0  Suicidal thoughts 0 0 0 0 0  PHQ-9 Score 0 0 0 0 0  Difficult doing work/chores Not difficult at all  Not difficult at all      Hypertension:  BP Readings from Last 3 Encounters:  07/11/23 118/72  07/10/23 113/79  05/28/23 120/80    Obesity: Wt Readings from Last 3 Encounters:  07/11/23 227 lb 4.8 oz (103.1 kg)  07/10/23 220 lb (99.8 kg)  05/28/23 222 lb 14.4 oz (101.1 kg)   BMI Readings from Last 3 Encounters:  07/11/23 28.41 kg/m  07/10/23 27.50 kg/m  05/28/23 27.86 kg/m     Lipids:  Lab Results  Component Value Date   CHOL 136 08/20/2022   CHOL 150 02/18/2022   CHOL 198 07/17/2021   Lab Results  Component Value Date   HDL 59 08/20/2022   HDL 57 02/18/2022   HDL 46 07/17/2021   Lab Results  Component Value Date   LDLCALC 64 08/20/2022   LDLCALC 81 02/18/2022   LDLCALC 103 07/17/2021   Lab Results  Component Value Date   TRIG 58 08/20/2022   TRIG 42 02/18/2022   TRIG 136 07/17/2021   Lab Results  Component Value Date   CHOLHDL 2.3 08/20/2022   CHOLHDL 2.6 02/18/2022   CHOLHDL 2.8 10/11/2019   No results found for: "LDLDIRECT" Glucose:  Glucose, Bld  Date  Value Ref Range Status  05/28/2023 78 65 - 99 mg/dL Final    Comment:    .            Fasting reference interval .   08/20/2022 117 (H) 65 - 99 mg/dL Final    Comment:    .            Fasting reference interval . For someone without known diabetes, a glucose value between 100 and 125 mg/dL is consistent with prediabetes and should be confirmed with a follow-up test. .   02/18/2022 85 65 - 99 mg/dL Final    Comment:    .            Fasting reference interval .     Flowsheet Row Office Visit from 05/28/2023 in [facility]  AUDIT-C Score 5        Married STD testing and prevention (HIV/chl/gon/syphilis): N/A Sexual history: one partner , wife  Hep C Screening: 10/11/19  Skin cancer: Discussed monitoring for atypical lesions - seen by dermatologist earlier this year  Colorectal cancer: 07/23/18 Prostate cancer:  father has prostate cancer     Lung cancer:  Low Dose CT Chest recommended if Age 10-80 years, 30 pack-year currently smoking OR have quit w/in 15years. Patient  is not a candidate for screening   AAA: The USPSTF recommends one-time screening with ultrasonography in men ages 22 to 75 years who have ever smoked. Patient  is not  candidate for screening  ECG:  03/06/20  Vaccines:   HPV: N/A Tdap: booster today  Shingrix: N/A Pneumonia: N/A Flu: 2014 - refused  COVID-19: N/A  Advanced Care Planning: A voluntary discussion about advance care planning including the explanation and discussion of advance directives.  Discussed health care proxy and Living will, and the patient was able to identify a health care proxy as wife .  Patient does not have a living will and power of attorney of health care   Patient Active Problem List   Diagnosis Date Noted   Varicose veins of left lower extremity with inflammation 12/06/2022   Varicose veins with inflammation 09/09/2022   Overweight (BMI 25.0-29.9) 08/20/2022   Vitamin D deficiency  08/20/2022   Ketogenic diet monitoring encounter 08/20/2022   DDD (degenerative disc disease), cervical 11/04/2017   Varicocele 07/25/2016   Neck pain 10/12/2015   Panic attack as reaction to stress 06/02/2015   ADD (attention deficit disorder) 05/30/2015   Back pain, chronic 05/30/2015   Chronic venous insufficiency 05/30/2015   Abnormal antinuclear antibody titer 05/30/2015   Varicose veins of left leg with edema 05/30/2015   Migraine without aura and responsive to treatment 05/10/2009   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   Obesity (BMI 30-39.9) 08/08/2008    Past Surgical History:  Procedure Laterality Date   COLONOSCOPY WITH PROPOFOL N/A 07/23/2018   Procedure: COLONOSCOPY WITH PROPOFOL;  Surgeon: Toney Reil, MD;  Location: [facility] ENDOSCOPY;  Service: Gastroenterology;  Laterality: N/A;   LEG SURGERY     SHOULDER SURGERY     TONSILECTOMY, ADENOIDECTOMY, BILATERAL MYRINGOTOMY AND TUBES      Family History  Problem Relation Age of Onset   Kidney disease Father    ADD / ADHD Brother     Social History   Socioeconomic History   Marital status: Married    Spouse name: Rosey Bath   Number of children: 3   Years of education: Not on file   Highest education level: Some college, no degree  Occupational History   Occupation: Production designer, theatre/television/film    Comment: Administrator  Tobacco Use   Smoking status: Never   Smokeless tobacco: Never  Vaping Use   Vaping status: Never Used  Substance and Sexual Activity   Alcohol use: Yes    Alcohol/week: 6.0 standard drinks of alcohol    Types: 6 Cans of beer per week   Drug use: No   Sexual activity: Yes  Other Topics Concern   Not on file  Social History Narrative   He is married and has 3 children   Social Determinants of Health   Financial Resource Strain: Low Risk  (05/28/2023)   Overall Financial Resource Strain (CARDIA)    Difficulty of Paying Living Expenses: Not hard at all  Food Insecurity: No Food  Insecurity (05/28/2023)   Hunger Vital Sign    Worried About Running Out of Food in the Last Year: Never true    Ran Out of Food in the Last  Year: Never true  Transportation Needs: No Transportation Needs (05/28/2023)   PRAPARE - Administrator, Civil Service (Medical): No    Lack of Transportation (Non-Medical): No  Physical Activity: Sufficiently Active (05/28/2023)   Exercise Vital Sign    Days of Exercise per Week: 3 days    Minutes of Exercise per Session: 60 min  Stress: No Stress Concern Present (05/28/2023)   [employer] of Occupational Health - Occupational Stress Questionnaire    Feeling of Stress : Not at all  Social Connections: Socially Integrated (05/28/2023)   Social Connection and Isolation Panel [NHANES]    Frequency of Communication with Friends and Family: More than three times a week    Frequency of Social Gatherings with Friends and Family: Three times a week    Attends Religious Services: 1 to 4 times per year    Active Member of Clubs or Organizations: Yes    Attends Banker Meetings: 1 to 4 times per year    Marital Status: Married  Catering manager Violence: Not At Risk (07/11/2023)   Humiliation, Afraid, Rape, and Kick questionnaire    Fear of Current or Ex-Partner: No    Emotionally Abused: No    Physically Abused: No    Sexually Abused: No     Current Outpatient Medications:    amphetamine-dextroamphetamine (ADDERALL) 10 MG tablet, Take 1 tablet (10 mg total) by mouth 2 (two) times daily with a meal., Disp: 180 tablet, Rfl: 0   cyanocobalamin (VITAMIN B12) 1000 MCG tablet, Take 1,000 mcg by mouth daily., Disp: , Rfl:    Cholecalciferol (VITAMIN D) 50 MCG (2000 UT) CAPS, Take 1 capsule by mouth daily at 12 noon. (Patient not taking: Reported on 07/11/2023), Disp: , Rfl:   No Known Allergies   ROS  Constitutional: Negative for fever or weight change.  Respiratory: Negative for cough and shortness of breath.   Cardiovascular:  Negative for chest pain or palpitations.  Gastrointestinal: Negative for abdominal pain, no bowel changes.  Musculoskeletal: Negative for gait problem or joint swelling.  Skin: Negative for rash.  Neurological: Negative for dizziness or headache.  No other specific complaints in a complete review of systems (except as listed in HPI above).    Objective  Vitals:   07/11/23 1439  BP: 118/72  Pulse: 89  Resp: 16  Temp: 98 F (36.7 C)  TempSrc: Oral  SpO2: 99%  Weight: 227 lb 4.8 oz (103.1 kg)  Height: 6\' 3"  (1.905 m)    Body mass index is 28.41 kg/m.  Physical Exam  Constitutional: Patient appears well-developed and well-nourished. No distress.  HENT: Head: Normocephalic and atraumatic. Ears: B TMs ok, no erythema or effusion; Nose: Nose normal. Mouth/Throat: Oropharynx is clear and moist. No oropharyngeal exudate.  Eyes: Conjunctivae and EOM are normal. Pupils are equal, round, and reactive to light. No scleral icterus.  Neck: Normal range of motion. Neck supple. No JVD present. No thyromegaly present.  Cardiovascular: Normal rate, regular rhythm and normal heart sounds.  No murmur heard. Had varicose vein procedure yesterday and left lower leg is wrapped  Pulmonary/Chest: Effort normal and breath sounds normal. No respiratory distress. Abdominal: Soft. Bowel sounds are normal, no distension. There is no tenderness. no masses MALE GENITALIA: Normal descended testes bilaterally, varicocele, no masses palpated, no hernias, no lesions, no discharge RECTAL: not done  Musculoskeletal: Normal range of motion, no joint effusions. No gross deformities Neurological: he is alert and oriented to person, place, and time. No  cranial nerve deficit. Coordination, balance, strength, speech and gait are normal.  Skin: Skin is warm and dry. No rash noted. No erythema.  Psychiatric: Patient has a normal mood and affect. behavior is normal. Judgment and thought content normal.   Recent Results  (from the past 2160 hour(s))  CBC with Differential/Platelet     Status: Abnormal   Collection Time: 05/28/23  2:15 PM  Result Value Ref Range   WBC 5.5 3.8 - 10.8 Thousand/uL   RBC 4.14 (L) 4.20 - 5.80 Million/uL   Hemoglobin 13.8 13.2 - 17.1 g/dL   HCT 19.1 47.8 - 29.5 %   MCV 96.4 80.0 - 100.0 fL   MCH 33.3 (H) 27.0 - 33.0 pg   MCHC 34.6 32.0 - 36.0 g/dL   RDW 62.1 30.8 - 65.7 %   Platelets 153 140 - 400 Thousand/uL   MPV 11.3 7.5 - 12.5 fL   Neutro Abs 2,992 1,500 - 7,800 cells/uL   Lymphs Abs 1,898 850 - 3,900 cells/uL   Absolute Monocytes 484 200 - 950 cells/uL   Eosinophils Absolute 88 15 - 500 cells/uL   Basophils Absolute 39 0 - 200 cells/uL   Neutrophils Relative % 54.4 %   Total Lymphocyte 34.5 %   Monocytes Relative 8.8 %   Eosinophils Relative 1.6 %   Basophils Relative 0.7 %  COMPLETE METABOLIC PANEL WITH GFR     Status: Abnormal   Collection Time: 05/28/23  2:15 PM  Result Value Ref Range   Glucose, Bld 78 65 - 99 mg/dL    Comment: .            Fasting reference interval .    BUN 13 7 - 25 mg/dL   Creat 8.46 9.62 - 9.52 mg/dL   eGFR 841 > OR = 60 LK/GMW/1.02V2   BUN/Creatinine Ratio SEE NOTE: 6 - 22 (calc)    Comment:    Not Reported: BUN and Creatinine are within    reference range. .    Sodium 138 135 - 146 mmol/L   Potassium 4.0 3.5 - 5.3 mmol/L   Chloride 98 98 - 110 mmol/L   CO2 29 20 - 32 mmol/L   Calcium 9.3 8.6 - 10.3 mg/dL   Total Protein 6.2 6.1 - 8.1 g/dL   Albumin 4.5 3.6 - 5.1 g/dL   Globulin 1.7 (L) 1.9 - 3.7 g/dL (calc)   AG Ratio 2.6 (H) 1.0 - 2.5 (calc)   Total Bilirubin 1.2 0.2 - 1.2 mg/dL   Alkaline phosphatase (APISO) 31 (L) 36 - 130 U/L   AST 20 10 - 40 U/L   ALT 24 9 - 46 U/L  TSH     Status: None   Collection Time: 05/28/23  2:15 PM  Result Value Ref Range   TSH 1.32 0.40 - 4.50 mIU/L  Hemoglobin A1c     Status: None   Collection Time: 05/28/23  2:15 PM  Result Value Ref Range   Hgb A1c MFr Bld 5.4 <5.7 % of total Hgb     Comment: For the purpose of screening for the presence of diabetes: . <5.7%       Consistent with the absence of diabetes 5.7-6.4%    Consistent with increased risk for diabetes             (prediabetes) > or =6.5%  Consistent with diabetes . This assay result is consistent with a decreased risk of diabetes. . Currently, no consensus exists regarding use of hemoglobin A1c for  diagnosis of diabetes in children. . According to American Diabetes Association (ADA) guidelines, hemoglobin A1c <7.0% represents optimal control in non-pregnant diabetic patients. Different metrics may apply to specific patient populations.  Standards of Medical Care in Diabetes(ADA). .    Mean Plasma Glucose 108 mg/dL   eAG (mmol/L) 6.0 mmol/L    Comment: . This test was performed on the Roche cobas c503 platform. Effective 08/05/22, a change in test platforms from the Abbott Architect to the Roche cobas c503 may have shifted HbA1c results compared to historical results. Based on laboratory validation testing conducted at [facility], the Roche platform relative to the Abbott platform had an average increase in HbA1c value of < or = 0.3%. This difference is within accepted  variability established by the [facility]. Note that not all individuals will have had a shift in their results and direct comparisons between historical and current results for testing conducted on different platforms is not recommended.   Vitamin B12     Status: None   Collection Time: 05/28/23  2:15 PM  Result Value Ref Range   Vitamin B-12 389 200 - 1,100 pg/mL    Comment: . Please Note: Although the reference range for vitamin B12 is [phone] pg/mL, it has been reported that between 5 and 10% of patients with values between 200 and 400 pg/mL may experience neuropsychiatric and hematologic abnormalities due to occult B12 deficiency; less than 1% of patients with values above 400 pg/mL  will have symptoms. Marland Kitchen   VITAMIN D 25 Hydroxy (Vit-D Deficiency, Fractures)     Status: None   Collection Time: 05/28/23  2:15 PM  Result Value Ref Range   Vit D, 25-Hydroxy 35 30 - 100 ng/mL    Comment: Vitamin D Status         25-OH Vitamin D: . Deficiency:                    <20 ng/mL Insufficiency:             20 - 29 ng/mL Optimal:                 > or = 30 ng/mL . For 25-OH Vitamin D testing on patients on  D2-supplementation and patients for whom quantitation  of D2 and D3 fractions is required, the [facility](TM) 25-OH VIT D, (D2,D3), LC/MS/MS is recommended: order  code 16109 (patients >56yrs). . See Note 1 . Note 1 . For additional information, please refer to  http://education.[facility].com/faq/FAQ199  (This link is being provided for informational/ educational purposes only.)      Fall Risk:    07/11/2023    2:32 PM 05/28/2023    1:53 PM 02/19/2023    1:01 PM 11/20/2022    1:23 PM 08/20/2022    1:20 PM  Fall Risk   Falls in the past year? 0 0 0 0 0  Number falls in past yr: 0  0    Injury with Fall? 0  0    Risk for fall due to : No Fall Risks No Fall Risks No Fall Risks No Fall Risks No Fall Risks  Follow up Falls prevention discussed;Education provided;Falls evaluation completed Falls prevention discussed Falls prevention discussed;Education provided;Falls evaluation completed Falls prevention discussed Falls prevention discussed;Education provided;Falls evaluation completed     Functional Status Survey: Is the patient deaf or have difficulty hearing?: No Does the patient have difficulty seeing, even when wearing glasses/contacts?: No Does the patient have difficulty concentrating, remembering,  or making decisions?: No Does the patient have difficulty walking or climbing stairs?: No Does the patient have difficulty dressing or bathing?: No Does the patient have difficulty doing errands alone such as visiting a doctor's office or shopping?:  No    Assessment & Plan  1. Well adult exam   2. Need for Tdap vaccination  Not given - patient left     -Prostate cancer screening and PSA options (with potential risks and benefits of testing vs not testing) were discussed along with recent recs/guidelines. -USPSTF grade A and B recommendations reviewed with patient; age-appropriate recommendations, preventive care, screening tests, etc discussed and encouraged; healthy living encouraged; see AVS for patient education given to patient -Discussed importance of 150 minutes of physical activity weekly, eat two servings of fish weekly, eat one serving of tree nuts ( cashews, pistachios, pecans, almonds.Marland Kitchen) every other day, eat 6 servings of fruit/vegetables daily and drink plenty of water and avoid sweet beverages.  -Reviewed Health Maintenance: yes

## 2023-07-11 ENCOUNTER — Ambulatory Visit (INDEPENDENT_AMBULATORY_CARE_PROVIDER_SITE_OTHER): Payer: BC Managed Care – PPO | Admitting: Family Medicine

## 2023-07-11 ENCOUNTER — Encounter: Payer: Self-pay | Admitting: Family Medicine

## 2023-07-11 VITALS — BP 118/72 | HR 89 | Temp 98.0°F | Resp 16 | Ht 75.0 in | Wt 227.3 lb

## 2023-07-11 DIAGNOSIS — Z0000 Encounter for general adult medical examination without abnormal findings: Secondary | ICD-10-CM | POA: Diagnosis not present

## 2023-07-11 DIAGNOSIS — Z23 Encounter for immunization: Secondary | ICD-10-CM | POA: Diagnosis not present

## 2023-07-12 ENCOUNTER — Encounter (INDEPENDENT_AMBULATORY_CARE_PROVIDER_SITE_OTHER): Payer: Self-pay | Admitting: Vascular Surgery

## 2023-07-17 ENCOUNTER — Ambulatory Visit (INDEPENDENT_AMBULATORY_CARE_PROVIDER_SITE_OTHER): Payer: BC Managed Care – PPO

## 2023-07-17 DIAGNOSIS — I8312 Varicose veins of left lower extremity with inflammation: Secondary | ICD-10-CM

## 2023-07-26 HISTORY — PX: LASER ABLATION: SHX1947

## 2023-07-31 ENCOUNTER — Ambulatory Visit (INDEPENDENT_AMBULATORY_CARE_PROVIDER_SITE_OTHER): Payer: BC Managed Care – PPO | Admitting: Nurse Practitioner

## 2023-07-31 ENCOUNTER — Encounter (INDEPENDENT_AMBULATORY_CARE_PROVIDER_SITE_OTHER): Payer: Self-pay | Admitting: Nurse Practitioner

## 2023-07-31 VITALS — BP 128/88 | HR 96 | Resp 18 | Ht 75.0 in | Wt 231.2 lb

## 2023-07-31 DIAGNOSIS — I8312 Varicose veins of left lower extremity with inflammation: Secondary | ICD-10-CM

## 2023-08-04 NOTE — Progress Notes (Signed)
Subjective:    Patient ID: Christian Hamilton, male    DOB: 11-Oct-1986, 37 y.o.   MRN: [id number] Chief Complaint  Patient presents with   Follow-up    4 week Laser F/U    The patient returns to the office for followup status post laser ablation of the left great saphenous vein on 07/10/2023. The patient note significant improvement in the lower extremity pain but not resolution of the symptoms. The patient notes multiple residual varicosities bilaterally which continued to hurt with dependent positions and remained tender to palpation. The patient's swelling is minimally from preoperative status. The patient continues to wear graduated compression stockings on a daily basis but these are not eliminating the pain and discomfort. The patient continues to use over-the-counter anti-inflammatory medications to treat the pain and related symptoms but this has not given the patient relief. The patient notes the pain in the lower extremities is causing problems with daily exercise, problems at work and even with household activities such as preparing meals and doing dishes.  The patient is otherwise done well and there have been no complications related to the laser procedure or interval changes in the patient's overall     Venous ultrasound post laser shows successful laser ablation of the left, no DVT identified.    Review of Systems  Cardiovascular:  Negative for leg swelling.       Objective:   Physical Exam Vitals reviewed.  HENT:     Head: Normocephalic.  Cardiovascular:     Rate and Rhythm: Normal rate.     Pulses: Normal pulses.  Pulmonary:     Effort: Pulmonary effort is normal.  Musculoskeletal:        General: Tenderness present.  Skin:    General: Skin is warm and dry.  Neurological:     Mental Status: He is alert and oriented to person, place, and time.  Psychiatric:        Mood and Affect: Mood normal.        Behavior: Behavior normal.        Thought Content: Thought  content normal.        Judgment: Judgment normal.     BP 128/88 (BP Location: Left Arm)   Pulse 96   Resp 18   Ht 6\' 3"  (1.905 m)   Wt 231 lb 3.2 oz (104.9 kg)   BMI 28.90 kg/m   Past Medical History:  Diagnosis Date   ADHD (attention deficit hyperactivity disorder)    Anxiety    Asthma    Back pain    DDD (degenerative disc disease), cervical    Dysplastic nevus 02/26/2023   R lower back - moderate   Leg varices 05/30/2015   Dr. Earnestine Leys    Rectal bleeding     Social History   Socioeconomic History   Marital status: Married    Spouse name: Rosey Bath   Number of children: 3   Years of education: Not on file   Highest education level: Some college, no degree  Occupational History   Occupation: Production designer, theatre/television/film    Comment: Administrator  Tobacco Use   Smoking status: Never   Smokeless tobacco: Never  Vaping Use   Vaping status: Never Used  Substance and Sexual Activity   Alcohol use: Yes    Alcohol/week: 6.0 standard drinks of alcohol    Types: 6 Cans of beer per week   Drug use: No   Sexual activity: Yes  Other Topics Concern   Not  on file  Social History Narrative   He is married and has 3 children   Social Determinants of Health   Financial Resource Strain: Low Risk  (05/28/2023)   Overall Financial Resource Strain (CARDIA)    Difficulty of Paying Living Expenses: Not hard at all  Food Insecurity: No Food Insecurity (05/28/2023)   Hunger Vital Sign    Worried About Running Out of Food in the Last Year: Never true    Ran Out of Food in the Last Year: Never true  Transportation Needs: No Transportation Needs (05/28/2023)   PRAPARE - Administrator, Civil Service (Medical): No    Lack of Transportation (Non-Medical): No  Physical Activity: Sufficiently Active (05/28/2023)   Exercise Vital Sign    Days of Exercise per Week: 3 days    Minutes of Exercise per Session: 60 min  Stress: No Stress Concern Present (05/28/2023)   [employer] of  Occupational Health - Occupational Stress Questionnaire    Feeling of Stress : Not at all  Social Connections: Socially Integrated (05/28/2023)   Social Connection and Isolation Panel [NHANES]    Frequency of Communication with Friends and Family: More than three times a week    Frequency of Social Gatherings with Friends and Family: Three times a week    Attends Religious Services: 1 to 4 times per year    Active Member of Clubs or Organizations: Yes    Attends Banker Meetings: 1 to 4 times per year    Marital Status: Married  Catering manager Violence: Not At Risk (07/11/2023)   Humiliation, Afraid, Rape, and Kick questionnaire    Fear of Current or Ex-Partner: No    Emotionally Abused: No    Physically Abused: No    Sexually Abused: No    Past Surgical History:  Procedure Laterality Date   COLONOSCOPY WITH PROPOFOL N/A 07/23/2018   Procedure: COLONOSCOPY WITH PROPOFOL;  Surgeon: Toney Reil, MD;  Location: [facility] ENDOSCOPY;  Service: Gastroenterology;  Laterality: N/A;   LEG SURGERY     SHOULDER SURGERY     TONSILECTOMY, ADENOIDECTOMY, BILATERAL MYRINGOTOMY AND TUBES      Family History  Problem Relation Age of Onset   Kidney disease Father    ADD / ADHD Brother     No Known Allergies     Latest Ref Rng & Units 05/28/2023    2:15 PM 02/18/2022    1:59 PM 03/05/2020    2:29 PM  CBC  WBC 3.8 - 10.8 Thousand/uL 5.5  6.4  7.9   Hemoglobin 13.2 - 17.1 g/dL 09.3  23.5  57.3   Hematocrit 38.5 - 50.0 % 39.9  42.1  44.8   Platelets 140 - 400 Thousand/uL 153  175  165       CMP     Component Value Date/Time   NA 138 05/28/2023 1415   K 4.0 05/28/2023 1415   CL 98 05/28/2023 1415   CO2 29 05/28/2023 1415   GLUCOSE 78 05/28/2023 1415   BUN 13 05/28/2023 1415   CREATININE 0.72 05/28/2023 1415   CALCIUM 9.3 05/28/2023 1415   PROT 6.2 05/28/2023 1415   ALBUMIN 4.4 07/25/2016 1201   AST 20 05/28/2023 1415   ALT 24 05/28/2023 1415   ALKPHOS 47  07/25/2016 1201   BILITOT 1.2 05/28/2023 1415   EGFR 120 05/28/2023 1415   GFRNONAA >60 03/05/2020 1429   GFRNONAA 115 10/11/2019 0000     No results found.  Assessment & Plan:   1. Varicose veins of left lower extremity with inflammation Recommend:  The patient has had successful ablation of the previously incompetent saphenous venous system but still has persistent symptoms of pain and swelling that are having a negative impact on daily life and daily activities.  Patient should undergo injection sclerotherapy to treat the residual varicosities.  The risks, benefits and alternative therapies were reviewed in detail with the patient.  All questions were answered.  The patient agrees to proceed with sclerotherapy at their convenience.  The patient will continue wearing the graduated compression stockings and using the over-the-counter pain medications to treat her symptoms.      Current Outpatient Medications on File Prior to Visit  Medication Sig Dispense Refill   cyanocobalamin (VITAMIN B12) 1000 MCG tablet Take 1,000 mcg by mouth daily.     amphetamine-dextroamphetamine (ADDERALL) 10 MG tablet Take 1 tablet (10 mg total) by mouth 2 (two) times daily with a meal. (Patient not taking: Reported on 07/31/2023) 180 tablet 0   Cholecalciferol (VITAMIN D) 50 MCG (2000 UT) CAPS Take 1 capsule by mouth daily at 12 noon. (Patient not taking: Reported on 07/11/2023)     No current facility-administered medications on file prior to visit.    There are no Patient Instructions on file for this visit. No follow-ups on file.   Georgiana Spinner, NP

## 2023-08-26 NOTE — Progress Notes (Unsigned)
Name: Christian Hamilton   MRN: [id number]    DOB: [birth year]   Date:08/27/2023       Progress Note  Subjective  Chief Complaint  Follow Up  HPI  ADHD:   He was taking Adderal 25 mg ER but after a while  jittery and keeping him up at night/it happened when he stopped working very long shifts.  Since Summer 2024 he is back on Adderal 10 mg BID and feels better on current dose   Overweight : BMI is now below 30, his heaviest weight was over 300 lbs after his first son was born, he went down to 280 's lbs in 2021 and since October 2022  he started a low carb diet, intermittent fasting , weight is 231 lbs, weight went as low as 320 in our office, he states at home it went down to 212 lbs earlier this year   Left knee pain and instability: seen by Ortho in the past, was given meloxicam without help. He states he has a meniscal tear, sometimes is swells and sometimes it locks. Doing much better with weight loss. Unchanged  DDD cervical: he was having symptoms radiculitis down right arm, it was severe in the Fall 22 and was keeping him awake at night, I gave him Lyrica but he decided to just stretch at home and is doing better. Usually affected by increase in activity - like heavy lifting when working in his yard   Varicose: seen by vascular surgeon recently and had laser ablation , swelling has improved, he is going back soon to do the other veins   Vitamin D deficiency: last level at goal, continue supplementation   Low B12: advised to resume taking supplements a couple of times per week   Patient Active Problem List   Diagnosis Date Noted   Varicose veins of left lower extremity with inflammation 12/06/2022   Varicose veins with inflammation 09/09/2022   Overweight (BMI 25.0-29.9) 08/20/2022   Vitamin D deficiency 08/20/2022   Ketogenic diet monitoring encounter 08/20/2022   DDD (degenerative disc disease), cervical 11/04/2017   Varicocele 07/25/2016   Neck pain 10/12/2015   Panic attack as  reaction to stress 06/02/2015   ADD (attention deficit disorder) 05/30/2015   Back pain, chronic 05/30/2015   Chronic venous insufficiency 05/30/2015   Abnormal antinuclear antibody titer 05/30/2015   Varicose veins of left leg with edema 05/30/2015   Migraine without aura and responsive to treatment 05/10/2009   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   Obesity (BMI 30-39.9) 08/08/2008    Past Surgical History:  Procedure Laterality Date   COLONOSCOPY WITH PROPOFOL N/A 07/23/2018   Procedure: COLONOSCOPY WITH PROPOFOL;  Surgeon: Toney Reil, MD;  Location: [facility];  Service: Gastroenterology;  Laterality: N/A;   LASER ABLATION Left 07/26/2023   saphenous vein   SHOULDER SURGERY     TONSILECTOMY, ADENOIDECTOMY, BILATERAL MYRINGOTOMY AND TUBES      Family History  Problem Relation Age of Onset   Kidney disease Father    ADD / ADHD Brother     Social History   Tobacco Use   Smoking status: Never   Smokeless tobacco: Never  Substance Use Topics   Alcohol use: Yes    Alcohol/week: 6.0 standard drinks of alcohol    Types: 6 Cans of beer per week     Current Outpatient Medications:    cyanocobalamin (VITAMIN B12) 1000 MCG tablet, Take 1,000 mcg by mouth daily., Disp: , Rfl:  amphetamine-dextroamphetamine (ADDERALL) 10 MG tablet, Take 1 tablet (10 mg total) by mouth 2 (two) times daily with a meal., Disp: 180 tablet, Rfl: 0   Cholecalciferol (VITAMIN D) 50 MCG (2000 UT) CAPS, Take 1 capsule by mouth daily at 12 noon. (Patient not taking: Reported on 07/11/2023), Disp: , Rfl:   No Known Allergies  I personally reviewed active problem list, medication list, allergies, family history, social history, health maintenance with the patient/caregiver today.   ROS  Ten systems reviewed and is negative except as mentioned in HPI    Objective  Vitals:   08/27/23 1537  BP: 124/80  Pulse: 90  Resp: 14  Temp: 98.3 F (36.8 C)  TempSrc: Oral  SpO2:  98%  Weight: 231 lb 8 oz (105 kg)  Height: 6\' 3"  (1.905 m)    Body mass index is 28.94 kg/m.  Physical Exam  Constitutional: Patient appears well-developed and well-nourished. Obese  No distress.  HEENT: head atraumatic, normocephalic, pupils equal and reactive to light, neck supple Cardiovascular: Normal rate, regular rhythm and normal heart sounds.  No murmur heard. No BLE edema. Varicose veins on left lower extremity  Pulmonary/Chest: Effort normal and breath sounds normal. No respiratory distress. Abdominal: Soft.  There is no tenderness. Psychiatric: Patient has a normal mood and affect. behavior is normal. Judgment and thought content normal.   PHQ2/9:    08/27/2023    3:38 PM 07/11/2023    2:34 PM 05/28/2023    1:53 PM 02/19/2023    1:01 PM 11/20/2022    1:23 PM  Depression screen PHQ 2/9  Decreased Interest 0 0 0 0 0  Down, Depressed, Hopeless 0 0 0 0 0  PHQ - 2 Score 0 0 0 0 0  Altered sleeping 0 0 0 0 0  Tired, decreased energy 0 0 0 0 0  Change in appetite 0 0 0 0 0  Feeling bad or failure about yourself  0 0 0 0 0  Trouble concentrating 0 0 0 0 0  Moving slowly or fidgety/restless 0 0 0 0 0  Suicidal thoughts 0 0 0 0 0  PHQ-9 Score 0 0 0 0 0  Difficult doing work/chores  Not difficult at all  Not difficult at all     phq 9 is negative   Fall Risk:    08/27/2023    3:38 PM 07/11/2023    2:32 PM 05/28/2023    1:53 PM 02/19/2023    1:01 PM 11/20/2022    1:23 PM  Fall Risk   Falls in the past year? 0 0 0 0 0  Number falls in past yr:  0  0   Injury with Fall?  0  0   Risk for fall due to : No Fall Risks No Fall Risks No Fall Risks No Fall Risks No Fall Risks  Follow up Falls prevention discussed Falls prevention discussed;Education provided;Falls evaluation completed Falls prevention discussed Falls prevention discussed;Education provided;Falls evaluation completed Falls prevention discussed     Assessment & Plan  1. Attention deficit hyperactivity  disorder (ADHD), predominantly inattentive type  - amphetamine-dextroamphetamine (ADDERALL) 10 MG tablet; Take 1 tablet (10 mg total) by mouth 2 (two) times daily with a meal.  Dispense: 180 tablet; Refill: 0   2. Vitamin D deficiency  Continue supplements  3. Overweight (BMI 25.0-29.9)  Doing well with life style modification  4. Varicose veins of left leg with edema  Improved since laser ablation  5. Radiculitis, cervical   Doing  well at this time, discussed stretching neck on the floor at night

## 2023-08-27 ENCOUNTER — Ambulatory Visit: Payer: BC Managed Care – PPO | Admitting: Family Medicine

## 2023-08-27 VITALS — BP 124/80 | HR 90 | Temp 98.3°F | Resp 14 | Ht 75.0 in | Wt 231.5 lb

## 2023-08-27 DIAGNOSIS — E559 Vitamin D deficiency, unspecified: Secondary | ICD-10-CM

## 2023-08-27 DIAGNOSIS — F900 Attention-deficit hyperactivity disorder, predominantly inattentive type: Secondary | ICD-10-CM

## 2023-08-27 DIAGNOSIS — I83892 Varicose veins of left lower extremities with other complications: Secondary | ICD-10-CM | POA: Diagnosis not present

## 2023-08-27 DIAGNOSIS — M5412 Radiculopathy, cervical region: Secondary | ICD-10-CM

## 2023-08-27 DIAGNOSIS — E663 Overweight: Secondary | ICD-10-CM

## 2023-08-27 MED ORDER — AMPHETAMINE-DEXTROAMPHETAMINE 10 MG PO TABS: 10.0000 mg | ORAL_TABLET | Freq: Two times a day (BID) | ORAL | 0 refills | Status: DC

## 2023-09-09 ENCOUNTER — Ambulatory Visit: Payer: BC Managed Care – PPO | Admitting: Nurse Practitioner

## 2023-10-16 ENCOUNTER — Encounter: Payer: Self-pay | Admitting: Nurse Practitioner

## 2023-10-16 ENCOUNTER — Ambulatory Visit (INDEPENDENT_AMBULATORY_CARE_PROVIDER_SITE_OTHER): Payer: BC Managed Care – PPO | Admitting: Nurse Practitioner

## 2023-10-16 ENCOUNTER — Telehealth: Payer: Self-pay

## 2023-10-16 VITALS — BP 124/76 | HR 86 | Temp 97.6°F | Resp 18 | Ht 75.0 in | Wt 236.4 lb

## 2023-10-16 DIAGNOSIS — M25512 Pain in left shoulder: Secondary | ICD-10-CM

## 2023-10-16 MED ORDER — PREDNISONE 10 MG (21) PO TBPK
ORAL_TABLET | ORAL | 0 refills | Status: DC
Start: 2023-10-16 — End: 2024-02-26

## 2023-10-16 MED ORDER — NAPROXEN 500 MG PO TABS
500.0000 mg | ORAL_TABLET | Freq: Two times a day (BID) | ORAL | 0 refills | Status: DC
Start: 2023-10-16 — End: 2024-02-26

## 2023-10-16 MED ORDER — TIZANIDINE HCL 4 MG PO TABS: 2.0000 mg | ORAL_TABLET | Freq: Three times a day (TID) | ORAL | 2 refills | Status: DC | PRN

## 2023-10-16 NOTE — Telephone Encounter (Addendum)
Called patient regarding bx results from May 2024. Patient verbalized understanding and denied further questions. Patient scheduled for follow up in May 2026 to have area checked as well as a 1 year follow up.    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

## 2023-10-16 NOTE — Progress Notes (Signed)
BP 124/76 (BP Location: Right Arm, Patient Position: Sitting, Cuff Size: Large)   Pulse 86   Temp 97.6 F (36.4 C) (Oral)   Resp 18   Ht 6\' 3"  (1.905 m) Comment: per chart  Wt 236 lb 6.4 oz (107.2 kg)   BMI 29.55 kg/m    Subjective:    Patient ID: Christian Hamilton, male    DOB: 08/17/1986, 37 y.o.   MRN: [id number]  HPI: Christian Hamilton is a 37 y.o. male  Chief Complaint  Patient presents with   Shoulder Pain    No known injury    Discussed the use of AI scribe software for clinical note transcription with the patient, who gave verbal consent to proceed.  History of Present Illness   The patient, with a history of left shoulder surgeries and nerve issues, presents with left shoulder pain that started approximately two weeks ago. He woke up one morning with what he initially thought was a stiff neck, but the pain has persisted and he believes it to be nerve-related. The pain is located in the left shoulder and radiates down the arm. Certain movements, particularly lifting the arm above the head, exacerbate the pain, and he has experienced episodes of weakness in the arm. He has been managing the pain with ibuprofen, which provides minimal relief. The patient also reports difficulty sleeping due to the pain.       10/16/2023   10:52 AM 08/27/2023    3:38 PM 07/11/2023    2:34 PM  Depression screen PHQ 2/9  Decreased Interest 0 0 0  Down, Depressed, Hopeless 0 0 0  PHQ - 2 Score 0 0 0  Altered sleeping 0 0 0  Tired, decreased energy 0 0 0  Change in appetite 0 0 0  Feeling bad or failure about yourself  0 0 0  Trouble concentrating 0 0 0  Moving slowly or fidgety/restless 0 0 0  Suicidal thoughts 0 0 0  PHQ-9 Score 0 0 0  Difficult doing work/chores   Not difficult at all    Relevant past medical, surgical, family and social history reviewed and updated as indicated. Interim medical history since our last visit reviewed. Allergies and medications reviewed and  updated.  Review of Systems  Ten systems reviewed and is negative except as mentioned in HPI      Objective:    BP 124/76 (BP Location: Right Arm, Patient Position: Sitting, Cuff Size: Large)   Pulse 86   Temp 97.6 F (36.4 C) (Oral)   Resp 18   Ht 6\' 3"  (1.905 m) Comment: per chart  Wt 236 lb 6.4 oz (107.2 kg)   BMI 29.55 kg/m    Wt Readings from Last 3 Encounters:  10/16/23 236 lb 6.4 oz (107.2 kg)  08/27/23 231 lb 8 oz (105 kg)  07/31/23 231 lb 3.2 oz (104.9 kg)    Physical Exam  Constitutional: Patient appears well-developed and well-nourished.  No distress.  HEENT: head atraumatic, normocephalic, pupils equal and reactive to light, neck supple Cardiovascular: Normal rate, regular rhythm and normal heart sounds.  No murmur heard. No BLE edema. Pulmonary/Chest: Effort normal and breath sounds normal. No respiratory distress. Abdominal: Soft.  There is no tenderness. MSK: left shoulder decrease range of motion,  distal pulses present Psychiatric: Patient has a normal mood and affect. behavior is normal. Judgment and thought content normal.  Results for orders placed or performed in visit on 02/19/23  CBC with Differential/Platelet  Collection Time: 05/28/23  2:15 PM  Result Value Ref Range   WBC 5.5 3.8 - 10.8 Thousand/uL   RBC 4.14 (L) 4.20 - 5.80 Million/uL   Hemoglobin 13.8 13.2 - 17.1 g/dL   HCT 16.1 09.6 - 04.5 %   MCV 96.4 80.0 - 100.0 fL   MCH 33.3 (H) 27.0 - 33.0 pg   MCHC 34.6 32.0 - 36.0 g/dL   RDW 40.9 81.1 - 91.4 %   Platelets 153 140 - 400 Thousand/uL   MPV 11.3 7.5 - 12.5 fL   Neutro Abs 2,992 1,500 - 7,800 cells/uL   Lymphs Abs 1,898 850 - 3,900 cells/uL   Absolute Monocytes 484 200 - 950 cells/uL   Eosinophils Absolute 88 15 - 500 cells/uL   Basophils Absolute 39 0 - 200 cells/uL   Neutrophils Relative % 54.4 %   Total Lymphocyte 34.5 %   Monocytes Relative 8.8 %   Eosinophils Relative 1.6 %   Basophils Relative 0.7 %  COMPLETE METABOLIC  PANEL WITH GFR   Collection Time: 05/28/23  2:15 PM  Result Value Ref Range   Glucose, Bld 78 65 - 99 mg/dL   BUN 13 7 - 25 mg/dL   Creat 7.82 9.56 - 2.13 mg/dL   eGFR 086 > OR = 60 VH/QIO/9.62X5   BUN/Creatinine Ratio SEE NOTE: 6 - 22 (calc)   Sodium 138 135 - 146 mmol/L   Potassium 4.0 3.5 - 5.3 mmol/L   Chloride 98 98 - 110 mmol/L   CO2 29 20 - 32 mmol/L   Calcium 9.3 8.6 - 10.3 mg/dL   Total Protein 6.2 6.1 - 8.1 g/dL   Albumin 4.5 3.6 - 5.1 g/dL   Globulin 1.7 (L) 1.9 - 3.7 g/dL (calc)   AG Ratio 2.6 (H) 1.0 - 2.5 (calc)   Total Bilirubin 1.2 0.2 - 1.2 mg/dL   Alkaline phosphatase (APISO) 31 (L) 36 - 130 U/L   AST 20 10 - 40 U/L   ALT 24 9 - 46 U/L  TSH   Collection Time: 05/28/23  2:15 PM  Result Value Ref Range   TSH 1.32 0.40 - 4.50 mIU/L  Hemoglobin A1c   Collection Time: 05/28/23  2:15 PM  Result Value Ref Range   Hgb A1c MFr Bld 5.4 <5.7 % of total Hgb   Mean Plasma Glucose 108 mg/dL   eAG (mmol/L) 6.0 mmol/L  Vitamin B12   Collection Time: 05/28/23  2:15 PM  Result Value Ref Range   Vitamin B-12 389 200 - 1,100 pg/mL  VITAMIN D 25 Hydroxy (Vit-D Deficiency, Fractures)   Collection Time: 05/28/23  2:15 PM  Result Value Ref Range   Vit D, 25-Hydroxy 35 30 - 100 ng/mL       Assessment & Plan:   Problem List Items Addressed This Visit   None Visit Diagnoses       Acute pain of left shoulder    -  Primary   Relevant Medications   predniSONE (STERAPRED UNI-PAK 21 TAB) 10 MG (21) TBPK tablet   tiZANidine (ZANAFLEX) 4 MG tablet   naproxen (NAPROSYN) 500 MG tablet        Assessment and Plan    Left Shoulder Pain Pain radiating from the shoulder down the arm, exacerbated by certain movements and lifting. History of previous shoulder surgeries. Reduced range of motion noted on examination.  -Discontinue ibuprofen. -Start a new naproxen. -Start a muscle relaxer, with caution advised due to potential for drowsiness. -Start a steroid taper  today. -Refer  to orthopedics if no improvement.   Follow-up Patient to send a message next week to update on condition.        Follow up plan: Return if symptoms worsen or fail to improve.

## 2023-10-23 ENCOUNTER — Ambulatory Visit: Payer: Self-pay | Admitting: *Deleted

## 2023-10-23 NOTE — Telephone Encounter (Signed)
  Chief Complaint: Spoke with wife but he was in the background answering questions. Symptoms: fever 101.6, coughing, headache, mild dizziness, stiff neck, elevated heart rate. Frequency: started Christmas Eve. Pertinent Negatives: Patient denies anyone else in house hold being sick. Disposition: [] ED /[] Urgent Care (no appt availability in office) / [x] Appointment(In office/virtual)/ []  [facility]/ [] Home Care/ [] Refused Recommended Disposition /[] [facility]/ []  Follow-up with PCP Additional Notes: Appt made with Della Goo, FNP for 10/24/2023 at 11:20.

## 2023-10-23 NOTE — Telephone Encounter (Signed)
Reason for Disposition  Fever present > 3 days (72 hours)  Answer Assessment - Initial Assessment Questions 1. ONSET: "When did the cough begin?"      Coughing, fever 101, congestion, headache, dizzy.    Started last night.    2. SEVERITY: "How bad is the cough today?"      Coughing a lot. 3. SPUTUM: "Describe the color of your sputum" (none, dry cough; clear, white, yellow, green)     Green 4. HEMOPTYSIS: "Are you coughing up any blood?" If so ask: "How much?" (flecks, streaks, tablespoons, etc.)     Not asked 5. DIFFICULTY BREATHING: "Are you having difficulty breathing?" If Yes, ask: "How bad is it?" (e.g., mild, moderate, severe)    - MILD: No SOB at rest, mild SOB with walking, speaks normally in sentences, can lie down, no retractions, pulse < 100.    - MODERATE: SOB at rest, SOB with minimal exertion and prefers to sit, cannot lie down flat, speaks in phrases, mild retractions, audible wheezing, pulse 100-120.    - SEVERE: Very SOB at rest, speaks in single words, struggling to breathe, sitting hunched forward, retractions, pulse > 120      Shortness of breath sitting 6. FEVER: "Do you have a fever?" If Yes, ask: "What is your temperature, how was it measured, and when did it start?"     Yes fever 101.4        Has not taken Tylenol or ibuprofen.    He has been on prednisoine and took last dose last night.    Was on prednisone for nerve problem in his neck. 7. CARDIAC HISTORY: "Do you have any history of heart disease?" (e.g., heart attack, congestive heart failure)      Not asked 8. LUNG HISTORY: "Do you have any history of lung disease?"  (e.g., pulmonary embolus, asthma, emphysema)     No 9. PE RISK FACTORS: "Do you have a history of blood clots?" (or: recent major surgery, recent prolonged travel, bedridden)     Not asked 10. OTHER SYMPTOMS: "Do you have any other symptoms?" (e.g., runny nose, wheezing, chest pain)       Dizzy, headache, stiff neck, coughing, congestion.     He  can walk but able to walk.   No sore throat or nasal congestion 11. PREGNANCY: "Is there any chance you are pregnant?" "When was your last menstrual period?"       N/A 12. TRAVEL: "Have you traveled out of the country in the last month?" (e.g., travel history, exposures)       N/A  Protocols used: Cough - Acute Productive-A-AH

## 2023-10-24 ENCOUNTER — Ambulatory Visit: Payer: BC Managed Care – PPO | Admitting: Nurse Practitioner

## 2023-10-24 VITALS — BP 126/80 | HR 99 | Temp 98.1°F | Resp 16 | Wt 235.0 lb

## 2023-10-24 DIAGNOSIS — R051 Acute cough: Secondary | ICD-10-CM

## 2023-10-24 DIAGNOSIS — R42 Dizziness and giddiness: Secondary | ICD-10-CM

## 2023-10-24 DIAGNOSIS — R519 Headache, unspecified: Secondary | ICD-10-CM

## 2023-10-24 DIAGNOSIS — J101 Influenza due to other identified influenza virus with other respiratory manifestations: Secondary | ICD-10-CM

## 2023-10-24 DIAGNOSIS — R509 Fever, unspecified: Secondary | ICD-10-CM | POA: Diagnosis not present

## 2023-10-24 LAB — POCT INFLUENZA A/B
Influenza A, POC: POSITIVE — AB
Influenza B, POC: NEGATIVE

## 2023-10-24 MED ORDER — PROMETHAZINE-DM 6.25-15 MG/5ML PO SYRP: 5.0000 mL | ORAL_SOLUTION | Freq: Four times a day (QID) | ORAL | 0 refills | Status: DC | PRN

## 2023-10-24 MED ORDER — OSELTAMIVIR PHOSPHATE 75 MG PO CAPS: 75.0000 mg | ORAL_CAPSULE | Freq: Two times a day (BID) | ORAL | 0 refills | Status: AC

## 2023-10-24 NOTE — Progress Notes (Signed)
BP 126/80 (BP Location: Right Arm, Patient Position: Sitting, Cuff Size: Large)   Pulse 99   Temp 98.1 F (36.7 C) (Oral)   Resp 16   Wt 235 lb (106.6 kg)   SpO2 96%   BMI 29.37 kg/m    Subjective:    Patient ID: Christian Hamilton, male    DOB: 1986-03-01, 37 y.o.   MRN: [id number]  HPI: Christian Hamilton is a 37 y.o. male  Chief Complaint  Patient presents with   Cough   Fever   Diarrhea   Generalized Body Aches    x3days    Discussed the use of AI scribe software for clinical note transcription with the patient, who gave verbal consent to proceed.  History of Present Illness   The patient,  presents with a three day history of cough, fever, diarrhea, body aches, and headache. The symptoms began on Christmas Eve with a sensation of chest tightness. The cough has been persistent, but improved after a shower. The fever was described as the 'highest fever I've ever had.' The patient denies significant nasal congestion or sore throat, but reports a deep chest discomfort. He has been managing symptoms with ibuprofen, Mucinex. The patient also reports shortness of breath and dizziness, but has been maintaining hydration. He has been experiencing significant drainage in the back of the throat and fluid in the ears. Despite the symptoms, the patient's lungs are clear and there is no indication of pneumonia.       10/16/2023   10:52 AM 08/27/2023    3:38 PM 07/11/2023    2:34 PM  Depression screen PHQ 2/9  Decreased Interest 0 0 0  Down, Depressed, Hopeless 0 0 0  PHQ - 2 Score 0 0 0  Altered sleeping 0 0 0  Tired, decreased energy 0 0 0  Change in appetite 0 0 0  Feeling bad or failure about yourself  0 0 0  Trouble concentrating 0 0 0  Moving slowly or fidgety/restless 0 0 0  Suicidal thoughts 0 0 0  PHQ-9 Score 0 0 0  Difficult doing work/chores   Not difficult at all    Relevant past medical, surgical, family and social history reviewed and updated as indicated. Interim medical  history since our last visit reviewed. Allergies and medications reviewed and updated.  Review of Systems  Ten systems reviewed and is negative except as mentioned in HPI      Objective:    BP 126/80 (BP Location: Right Arm, Patient Position: Sitting, Cuff Size: Large)   Pulse 99   Temp 98.1 F (36.7 C) (Oral)   Resp 16   Wt 235 lb (106.6 kg)   SpO2 96%   BMI 29.37 kg/m    Wt Readings from Last 3 Encounters:  10/24/23 235 lb (106.6 kg)  10/16/23 236 lb 6.4 oz (107.2 kg)  08/27/23 231 lb 8 oz (105 kg)    Physical Exam  Constitutional: Patient appears well-developed and well-nourished.  No distress.  HEENT: head atraumatic, normocephalic, pupils equal and reactive to light, ears Tm effusion, neck supple, throat erythematous Cardiovascular: Normal rate, regular rhythm and normal heart sounds.  No murmur heard. No BLE edema. Pulmonary/Chest: Effort normal and breath sounds normal. No respiratory distress. Abdominal: Soft.  There is no tenderness. Psychiatric: Patient has a normal mood and affect. behavior is normal. Judgment and thought content normal.  Results for orders placed or performed in visit on 10/24/23  POCT Influenza A/B   Collection  Time: 10/24/23 11:27 AM  Result Value Ref Range   Influenza A, POC Positive (A) Negative   Influenza B, POC Negative Negative       Assessment & Plan:   Problem List Items Addressed This Visit   None Visit Diagnoses       Influenza A    -  Primary   Relevant Medications   oseltamivir (TAMIFLU) 75 MG capsule   promethazine-dextromethorphan (PROMETHAZINE-DM) 6.25-15 MG/5ML syrup     Fever, unspecified fever cause       Relevant Orders   POCT Influenza A/B (Completed)   Novel Coronavirus, NAA ([facility])     Acute nonintractable headache, unspecified headache type       Relevant Orders   POCT Influenza A/B (Completed)   Novel Coronavirus, NAA ([facility])     Acute cough       Relevant Orders   POCT Influenza A/B (Completed)    Novel Coronavirus, NAA ([facility])     Dizziness       Relevant Orders   POCT Influenza A/B (Completed)   Novel Coronavirus, NAA ([facility])        Assessment and Plan    Influenza Positive flu test with symptoms of cough, fever, body aches, and headache. No signs of pneumonia on examination. -Prescribe Tamiflu. -Continue over-the-counter medications including Mucinex and ibuprofen, can also do flonase and zyrtec -Consider cough medicine for symptom relief and improved sleep. sent in phenergan dm -Encourage hydration and rest. -He is to send a message in a few days to update on condition.   Negative covid 19 home test but validity uncertain due to expiration date. covid pcr collected, results pending -Advise he to monitor symptoms and seek medical attention if he worsens.        Follow up plan: Return if symptoms worsen or fail to improve.

## 2023-10-25 LAB — NOVEL CORONAVIRUS, NAA: SARS-CoV-2, NAA: NOT DETECTED

## 2023-10-27 ENCOUNTER — Encounter: Payer: Self-pay | Admitting: Nurse Practitioner

## 2023-11-27 ENCOUNTER — Encounter: Payer: Self-pay | Admitting: Nurse Practitioner

## 2023-11-27 ENCOUNTER — Ambulatory Visit (INDEPENDENT_AMBULATORY_CARE_PROVIDER_SITE_OTHER): Payer: BC Managed Care – PPO | Admitting: Nurse Practitioner

## 2023-11-27 ENCOUNTER — Ambulatory Visit: Payer: BC Managed Care – PPO | Admitting: Nurse Practitioner

## 2023-11-27 VITALS — BP 118/84 | HR 88 | Resp 18 | Ht 75.0 in | Wt 242.4 lb

## 2023-11-27 DIAGNOSIS — F900 Attention-deficit hyperactivity disorder, predominantly inattentive type: Secondary | ICD-10-CM

## 2023-11-27 MED ORDER — AMPHETAMINE-DEXTROAMPHETAMINE 10 MG PO TABS: 10.0000 mg | ORAL_TABLET | Freq: Two times a day (BID) | ORAL | 0 refills | Status: DC

## 2023-11-27 NOTE — Progress Notes (Signed)
BP 118/84   Pulse 88   Resp 18   Ht 6\' 3"  (1.905 m)   Wt 242 lb 6.4 oz (110 kg)   SpO2 98%   BMI 30.30 kg/m    Subjective:    Patient ID: Christian Hamilton, male    DOB: 1986-02-18, 38 y.o.   MRN: [id number]  HPI: Christian Hamilton is a 38 y.o. male  Chief Complaint  Patient presents with   Medical Management of Chronic Issues    ADHD:  patient here for adhd follow up.  He is doing well on adderall 10 mg BID.  Denies any side effects.  Blood pressure and heart rate are in normal range.  He reports he is able to function and stay focused with the medication. PDMP reviewed, refills sent.      10/16/2023   10:52 AM 08/27/2023    3:38 PM 07/11/2023    2:34 PM  Depression screen PHQ 2/9  Decreased Interest 0 0 0  Down, Depressed, Hopeless 0 0 0  PHQ - 2 Score 0 0 0  Altered sleeping 0 0 0  Tired, decreased energy 0 0 0  Change in appetite 0 0 0  Feeling bad or failure about yourself  0 0 0  Trouble concentrating 0 0 0  Moving slowly or fidgety/restless 0 0 0  Suicidal thoughts 0 0 0  PHQ-9 Score 0 0 0  Difficult doing work/chores   Not difficult at all    Relevant past medical, surgical, family and social history reviewed and updated as indicated. Interim medical history since our last visit reviewed. Allergies and medications reviewed and updated.  Review of Systems  Constitutional: Negative for fever or weight change.  Respiratory: Negative for cough and shortness of breath.   Cardiovascular: Negative for chest pain or palpitations.  Gastrointestinal: Negative for abdominal pain, no bowel changes.  Musculoskeletal: Negative for gait problem or joint swelling.  Skin: Negative for rash.  Neurological: Negative for dizziness or headache.  No other specific complaints in a complete review of systems (except as listed in HPI above).      Objective:    BP 118/84   Pulse 88   Resp 18   Ht 6\' 3"  (1.905 m)   Wt 242 lb 6.4 oz (110 kg)   SpO2 98%   BMI 30.30 kg/m    Wt  Readings from Last 3 Encounters:  11/27/23 242 lb 6.4 oz (110 kg)  10/24/23 235 lb (106.6 kg)  10/16/23 236 lb 6.4 oz (107.2 kg)    Physical Exam Vitals reviewed.  Constitutional:      Appearance: Normal appearance.  HENT:     Head: Normocephalic.  Cardiovascular:     Rate and Rhythm: Normal rate and regular rhythm.  Pulmonary:     Effort: Pulmonary effort is normal.     Breath sounds: Normal breath sounds.  Musculoskeletal:        General: Normal range of motion.  Skin:    General: Skin is warm and dry.  Neurological:     General: No focal deficit present.     Mental Status: He is alert and oriented to person, place, and time. Mental status is at baseline.  Psychiatric:        Mood and Affect: Mood normal.        Behavior: Behavior normal.        Thought Content: Thought content normal.        Judgment: Judgment normal.  Results for orders placed or performed in visit on 10/24/23  Novel Coronavirus, NAA ([facility])   Collection Time: 10/24/23 11:27 AM   Specimen: Nasopharyngeal(NP) swabs in vial transport medium   Nasopharynge  Previous  Result Value Ref Range   SARS-CoV-2, NAA Not Detected Not Detected  POCT Influenza A/B   Collection Time: 10/24/23 11:27 AM  Result Value Ref Range   Influenza A, POC Positive (A) Negative   Influenza B, POC Negative Negative       Assessment & Plan:   Problem List Items Addressed This Visit       Other   ADD (attention deficit disorder) - Primary   Refills of adderall 10 mg BID sent in. Follow up in 3 months.       Relevant Medications   amphetamine-dextroamphetamine (ADDERALL) 10 MG tablet      Follow up plan: Return in about 3 months (around 02/25/2024) for follow up with Dr. Carlynn Purl.

## 2023-11-27 NOTE — Progress Notes (Deleted)
   There were no vitals taken for this visit.   Subjective:    Patient ID: Leone Haven, male    DOB: [birth year], 38 y.o.   MRN: [id number]  HPI: COUNCIL MUNGUIA is a 38 y.o. male  No chief complaint on file.   Discussed the use of AI scribe software for clinical note transcription with the patient, who gave verbal consent to proceed.  History of Present Illness           10/16/2023   10:52 AM 08/27/2023    3:38 PM 07/11/2023    2:34 PM  Depression screen PHQ 2/9  Decreased Interest 0 0 0  Down, Depressed, Hopeless 0 0 0  PHQ - 2 Score 0 0 0  Altered sleeping 0 0 0  Tired, decreased energy 0 0 0  Change in appetite 0 0 0  Feeling bad or failure about yourself  0 0 0  Trouble concentrating 0 0 0  Moving slowly or fidgety/restless 0 0 0  Suicidal thoughts 0 0 0  PHQ-9 Score 0 0 0  Difficult doing work/chores   Not difficult at all    Relevant past medical, surgical, family and social history reviewed and updated as indicated. Interim medical history since our last visit reviewed. Allergies and medications reviewed and updated.  Review of Systems  Per HPI unless specifically indicated above     Objective:    There were no vitals taken for this visit.  {Vitals History (Optional):23777} Wt Readings from Last 3 Encounters:  10/24/23 235 lb (106.6 kg)  10/16/23 236 lb 6.4 oz (107.2 kg)  08/27/23 231 lb 8 oz (105 kg)    Physical Exam  Results for orders placed or performed in visit on 10/24/23  Novel Coronavirus, NAA ([facility])   Collection Time: 10/24/23 11:27 AM   Specimen: Nasopharyngeal(NP) swabs in vial transport medium   Nasopharynge  Previous  Result Value Ref Range   SARS-CoV-2, NAA Not Detected Not Detected  POCT Influenza A/B   Collection Time: 10/24/23 11:27 AM  Result Value Ref Range   Influenza A, POC Positive (A) Negative   Influenza B, POC Negative Negative   {Labs (Optional):23779}    Assessment & Plan:   Problem List Items Addressed This  Visit   None    Assessment and Plan             Follow up plan: No follow-ups on file.

## 2023-11-27 NOTE — Assessment & Plan Note (Signed)
Refills of adderall 10 mg BID sent in. Follow up in 3 months.

## 2023-12-31 ENCOUNTER — Other Ambulatory Visit: Payer: Self-pay | Admitting: Nurse Practitioner

## 2023-12-31 DIAGNOSIS — J101 Influenza due to other identified influenza virus with other respiratory manifestations: Secondary | ICD-10-CM

## 2024-01-01 NOTE — Telephone Encounter (Signed)
 Requested medication (s) are due for refill today:   Provider to review  Requested medication (s) are on the active medication list:   Yes  Future visit scheduled:   Yes 5/1 with Dr. Carlynn Purl    LOV 10/24/2023 with Della Goo   Last ordered: 10/24/2023 118 ml, 0 refills  Non delegated refill    Requested Prescriptions  Pending Prescriptions Disp Refills   promethazine-dextromethorphan (PROMETHAZINE-DM) 6.25-15 MG/5ML syrup [Pharmacy Med Name: PROMETHAZINE-DM 6.25-15 MG/5 ML SYR] 118 mL 0    Sig: TAKE 5 MILLILITERS BY MOUTH 4 TIMES A DAY AS NEEDED FOR COUGH     Ear, Nose, and Throat:  Antitussives/Expectorants Passed - 01/01/2024  8:14 AM      Passed - Valid encounter within last 12 months    Recent Outpatient Visits           1 month ago Attention deficit hyperactivity disorder (ADHD), predominantly inattentive type   [facility] Berniece Salines, FNP   2 months ago Influenza A   [facility] Della Goo F, FNP   2 months ago Acute pain of left shoulder   [facility] Della Goo F, FNP   4 months ago Attention deficit hyperactivity disorder (ADHD), predominantly inattentive type   [facility] Alba Cory, MD   5 months ago Well adult exam   [facility] Alba Cory, MD       Future Appointments             In 1 month Alba Cory, MD [facility]   In 1 year Deirdre Evener, MD [facility]

## 2024-02-10 ENCOUNTER — Encounter: Payer: Self-pay | Admitting: Family Medicine

## 2024-02-11 ENCOUNTER — Ambulatory Visit: Admitting: Family Medicine

## 2024-02-11 ENCOUNTER — Encounter: Payer: Self-pay | Admitting: Family Medicine

## 2024-02-11 VITALS — BP 124/80 | HR 97 | Temp 98.2°F | Resp 16 | Ht 75.0 in | Wt 241.0 lb

## 2024-02-11 DIAGNOSIS — Z8709 Personal history of other diseases of the respiratory system: Secondary | ICD-10-CM | POA: Diagnosis not present

## 2024-02-11 DIAGNOSIS — R059 Cough, unspecified: Secondary | ICD-10-CM

## 2024-02-11 DIAGNOSIS — J069 Acute upper respiratory infection, unspecified: Secondary | ICD-10-CM

## 2024-02-11 MED ORDER — AZITHROMYCIN 250 MG PO TABS: ORAL_TABLET | ORAL | 0 refills | Status: DC

## 2024-02-11 MED ORDER — ALBUTEROL SULFATE HFA 108 (90 BASE) MCG/ACT IN AERS
2.0000 | INHALATION_SPRAY | Freq: Four times a day (QID) | RESPIRATORY_TRACT | 0 refills | Status: DC | PRN
Start: 2024-02-11 — End: 2024-08-30

## 2024-02-11 NOTE — Patient Instructions (Signed)
 You can try the inhaler and mucinex and see if you continue improving. If you worsen you can go get the Xray done or start the Zpak and then the Xray is there if you still don't improve

## 2024-02-11 NOTE — Progress Notes (Signed)
 Patient ID: Christian Hamilton, male    DOB: August 07, 1986, 38 y.o.   MRN: [id number]  PCP: Alba Cory, MD  Chief Complaint  Patient presents with   Cough    Sx started past Friday, pt states hurts to breathe hears rattles in chest. Pt son was dx was pneumonia.    Subjective:   Christian Hamilton is a 38 y.o. male, presents to clinic with CC of the following:  HPI  URI sx cough/ son diagnosed with pneumonia He was sick with URI sx, cough, fatigue and chest tightness got worse over the last 3-4 days but improved today He has hx of childhood asthma and pneumonia, he has not felt wheezy, does not feel as bad as past episodes of pneumonia No fever sweats chills or pleuritic chest pain today Still some coughing little tightness and hearing rattling - several home sick contacts   Patient Active Problem List   Diagnosis Date Noted   Varicose veins of left lower extremity with inflammation 12/06/2022   Varicose veins with inflammation 09/09/2022   Overweight (BMI 25.0-29.9) 08/20/2022   Vitamin D deficiency 08/20/2022   Ketogenic diet monitoring encounter 08/20/2022   DDD (degenerative disc disease), cervical 11/04/2017   Varicocele 07/25/2016   Neck pain 10/12/2015   Panic attack as reaction to stress 06/02/2015   ADD (attention deficit disorder) 05/30/2015   Back pain, chronic 05/30/2015   Chronic venous insufficiency 05/30/2015   Abnormal antinuclear antibody titer 05/30/2015   Varicose veins of left leg with edema 05/30/2015   Migraine without aura and responsive to treatment 05/10/2009   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   Obesity (BMI 30-39.9) 08/08/2008      Current Outpatient Medications:    albuterol (VENTOLIN HFA) 108 (90 Base) MCG/ACT inhaler, Inhale 2 puffs into the lungs every 6 (six) hours as needed for wheezing or shortness of breath., Disp: 8 g, Rfl: 0   amphetamine-dextroamphetamine (ADDERALL) 10 MG tablet, Take 1 tablet (10 mg total) by mouth 2  (two) times daily with a meal., Disp: 180 tablet, Rfl: 0   azithromycin (ZITHROMAX) 250 MG tablet, Take 2 tabs (500 mg) PO qd x 1, then take 1 tab (250 mg) PO qd for day 2-5, Disp: 6 each, Rfl: 0   cyanocobalamin (VITAMIN B12) 1000 MCG tablet, Take 1,000 mcg by mouth daily., Disp: , Rfl:    naproxen (NAPROSYN) 500 MG tablet, Take 1 tablet (500 mg total) by mouth 2 (two) times daily with a meal., Disp: 60 tablet, Rfl: 0   predniSONE (STERAPRED UNI-PAK 21 TAB) 10 MG (21) TBPK tablet, Take as directed on package.  (60 mg po on day 1, 50 mg po on day 2...), Disp: 21 tablet, Rfl: 0   promethazine-dextromethorphan (PROMETHAZINE-DM) 6.25-15 MG/5ML syrup, Take 5 mLs by mouth 4 (four) times daily as needed for cough., Disp: 118 mL, Rfl: 0   tiZANidine (ZANAFLEX) 4 MG tablet, Take 0.5-1.5 tablets (2-6 mg total) by mouth every 8 (eight) hours as needed for muscle spasms (muscle tightness)., Disp: 90 tablet, Rfl: 2   No Known Allergies   Social History   Tobacco Use   Smoking status: Never   Smokeless tobacco: Never  Vaping Use   Vaping status: Never Used  Substance Use Topics   Alcohol use: Yes    Alcohol/week: 6.0 standard drinks of alcohol    Types: 6 Cans of beer per week   Drug use: No      Chart Review Today: I  personally reviewed active problem list, medication list, allergies, family history, social history, health maintenance, notes from last encounter, lab results, imaging with the patient/caregiver today.   Review of Systems  Constitutional: Negative.   HENT: Negative.    Eyes: Negative.   Respiratory: Negative.    Cardiovascular: Negative.   Gastrointestinal: Negative.   Endocrine: Negative.   Genitourinary: Negative.   Musculoskeletal: Negative.   Skin: Negative.   Allergic/Immunologic: Negative.   Neurological: Negative.   Hematological: Negative.   Psychiatric/Behavioral: Negative.    All other systems reviewed and are negative.      Objective:   Vitals:    02/11/24 1409  BP: 124/80  Pulse: 97  Resp: 16  Temp: 98.2 F (36.8 C)  TempSrc: Oral  SpO2: 97%  Weight: 241 lb (109.3 kg)  Height: 6\' 3"  (1.905 m)    Body mass index is 30.12 kg/m.  Physical Exam Vitals and nursing note reviewed.  Constitutional:      General: He is not in acute distress.    Appearance: Normal appearance. He is well-developed. He is not ill-appearing, toxic-appearing or diaphoretic.  HENT:     Head: Normocephalic and atraumatic.     Nose: Nose normal.  Eyes:     General:        Right eye: No discharge.        Left eye: No discharge.     Conjunctiva/sclera: Conjunctivae normal.  Neck:     Trachea: No tracheal deviation.  Cardiovascular:     Rate and Rhythm: Normal rate and regular rhythm.     Pulses: Normal pulses.     Heart sounds: Normal heart sounds.  Pulmonary:     Effort: Pulmonary effort is normal. No respiratory distress.     Breath sounds: No stridor. Rhonchi (to left lower lung) present. No wheezing or rales.  Skin:    General: Skin is warm and dry.     Findings: No rash.  Neurological:     Mental Status: He is alert.     Motor: No abnormal muscle tone.     Coordination: Coordination normal.  Psychiatric:        Behavior: Behavior normal.      Results for orders placed or performed in visit on 10/24/23  Novel Coronavirus, NAA ([facility])   Collection Time: 10/24/23 11:27 AM   Specimen: Nasopharyngeal(NP) swabs in vial transport medium   Nasopharynge  Previous  Result Value Ref Range   SARS-CoV-2, NAA Not Detected Not Detected  POCT Influenza A/B   Collection Time: 10/24/23 11:27 AM  Result Value Ref Range   Influenza A, POC Positive (A) Negative   Influenza B, POC Negative Negative       Assessment & Plan:   1. Upper respiratory tract infection, unspecified type (Primary) URI sx with multiple sick contacts, symptoms did worsen over the past 4 days but the last 24 hours he started to improve and is generally feeling better No  fever, sweats, exertional dyspnea, chest pain with inspiration but he does have some tightness and rattling in his chest He has used cough syrups that he had at home Discussed options to do a chest x-ray or cover with antibiotics for possible atypical pneumonia, but since he is starting to improve he will wait and see how he does he does except the inhaler and he will start taking Mucinex - DG Chest 2 View  2. Cough, unspecified type Imaging ordered in case he has any worsening - DG Chest 2 View  3. History of asthma With tightness and rhonchi he will try rescue inhaler over-the-counter cough meds, Mucinex - albuterol (VENTOLIN HFA) 108 (90 Base) MCG/ACT inhaler; Inhale 2 puffs into the lungs every 6 (six) hours as needed for wheezing or shortness of breath.  Dispense: 8 g; Refill: 0       Adeline Hone, PA-C 02/11/24 3:26 PM

## 2024-02-26 ENCOUNTER — Encounter: Payer: Self-pay | Admitting: Family Medicine

## 2024-02-26 ENCOUNTER — Ambulatory Visit: Payer: Self-pay | Admitting: Family Medicine

## 2024-02-26 VITALS — BP 122/74 | HR 78 | Resp 16 | Ht 75.0 in | Wt 241.2 lb

## 2024-02-26 DIAGNOSIS — Z1211 Encounter for screening for malignant neoplasm of colon: Secondary | ICD-10-CM

## 2024-02-26 DIAGNOSIS — Z23 Encounter for immunization: Secondary | ICD-10-CM

## 2024-02-26 DIAGNOSIS — M5030 Other cervical disc degeneration, unspecified cervical region: Secondary | ICD-10-CM

## 2024-02-26 DIAGNOSIS — F900 Attention-deficit hyperactivity disorder, predominantly inattentive type: Secondary | ICD-10-CM | POA: Diagnosis not present

## 2024-02-26 DIAGNOSIS — E559 Vitamin D deficiency, unspecified: Secondary | ICD-10-CM

## 2024-02-26 DIAGNOSIS — M5412 Radiculopathy, cervical region: Secondary | ICD-10-CM

## 2024-02-26 DIAGNOSIS — I83892 Varicose veins of left lower extremities with other complications: Secondary | ICD-10-CM

## 2024-02-26 MED ORDER — AMPHETAMINE-DEXTROAMPHETAMINE 15 MG PO TABS: 15.0000 mg | ORAL_TABLET | Freq: Two times a day (BID) | ORAL | 0 refills | Status: DC

## 2024-02-26 NOTE — Progress Notes (Signed)
 Name: Christian Hamilton   MRN: [id number]    DOB: 1986-01-02   Date:02/26/2024       Progress Note  Subjective  Chief Complaint  Chief Complaint  Patient presents with   Medical Management of Chronic Issues   Discussed the use of AI scribe software for clinical note transcription with the patient, who gave verbal consent to proceed.  History of Present Illness Christian Hamilton is a 38 year old male who presents for a follow-up and is due for a repeat colonoscopy.  He is due for a repeat colonoscopy, having had a colonoscopy in 2019 due to rectal bleeding, which revealed one polyp. No current rectal bleeding is reported.  He has a history of ADD and is currently taking Adderall 10 mg twice a day. The medication is not as effective as before, with effects lasting only one to two hours. He sometimes skips doses on weekends. He has previously tried sustained release formulations and other medications but prefers the current regimen despite its reduced efficacy.  He experiences chronic neck pain due to mild bilateral neuroforaminal stenosis at C3-C4 and C4-C5, and a disc protrusion at C5-C6. He occasionally uses a muscle relaxer for relief, which he finds effective without causing drowsiness. He experiences intermittent tingling in his hands, especially after heavy lifting.  He has varicose veins and plans to follow up with a vascular doctor. He reports less swelling in his left leg recently.  He has a history of asthma and is due for a tetanus shot. He is also advised to take vitamin D  supplements, which he has not been taking regularly.  He has gained approximately 10 pounds since his last visit and plans to resume a ketogenic diet and increase physical activity. His current weight is 241 pounds.    Patient Active Problem List   Diagnosis Date Noted   Varicose veins of left lower extremity with inflammation 12/06/2022   Varicose veins with inflammation 09/09/2022   Overweight (BMI 25.0-29.9)  08/20/2022   Vitamin D  deficiency 08/20/2022   Ketogenic diet monitoring encounter 08/20/2022   DDD (degenerative disc disease), cervical 11/04/2017   Varicocele 07/25/2016   Neck pain 10/12/2015   Panic attack as reaction to stress 06/02/2015   ADD (attention deficit disorder) 05/30/2015   Back pain, chronic 05/30/2015   Chronic venous insufficiency 05/30/2015   Abnormal antinuclear antibody titer 05/30/2015   Varicose veins of left leg with edema 05/30/2015   Migraine without aura and responsive to treatment 05/10/2009   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   Obesity (BMI 30-39.9) 08/08/2008    Past Surgical History:  Procedure Laterality Date   COLONOSCOPY WITH PROPOFOL  N/A 07/23/2018   Procedure: COLONOSCOPY WITH PROPOFOL ;  Surgeon: Selena Daily, MD;  Location: [facility] ENDOSCOPY;  Service: Gastroenterology;  Laterality: N/A;   LASER ABLATION Left 07/26/2023   saphenous vein   SHOULDER SURGERY     TONSILECTOMY, ADENOIDECTOMY, BILATERAL MYRINGOTOMY AND TUBES      Family History  Problem Relation Age of Onset   Kidney disease Father    ADD / ADHD Brother     Social History   Tobacco Use   Smoking status: Never   Smokeless tobacco: Never  Substance Use Topics   Alcohol use: Yes    Alcohol/week: 6.0 standard drinks of alcohol    Types: 6 Cans of beer per week     Current Outpatient Medications:    albuterol  (VENTOLIN  HFA) 108 (90 Base) MCG/ACT inhaler, Inhale 2 puffs into  the lungs every 6 (six) hours as needed for wheezing or shortness of breath., Disp: 8 g, Rfl: 0   amphetamine -dextroamphetamine  (ADDERALL) 10 MG tablet, Take 1 tablet (10 mg total) by mouth 2 (two) times daily with a meal., Disp: 180 tablet, Rfl: 0   cyanocobalamin  (VITAMIN B12) 1000 MCG tablet, Take 1,000 mcg by mouth daily., Disp: , Rfl:    tiZANidine  (ZANAFLEX ) 4 MG tablet, Take 0.5-1.5 tablets (2-6 mg total) by mouth every 8 (eight) hours as needed for muscle spasms (muscle  tightness)., Disp: 90 tablet, Rfl: 2   azithromycin  (ZITHROMAX ) 250 MG tablet, Take 2 tabs (500 mg) PO qd x 1, then take 1 tab (250 mg) PO qd for day 2-5 (Patient not taking: Reported on 02/26/2024), Disp: 6 each, Rfl: 0   naproxen  (NAPROSYN ) 500 MG tablet, Take 1 tablet (500 mg total) by mouth 2 (two) times daily with a meal. (Patient not taking: Reported on 02/26/2024), Disp: 60 tablet, Rfl: 0   predniSONE  (STERAPRED UNI-PAK 21 TAB) 10 MG (21) TBPK tablet, Take as directed on package.  (60 mg po on day 1, 50 mg po on day 2...) (Patient not taking: Reported on 02/26/2024), Disp: 21 tablet, Rfl: 0   promethazine -dextromethorphan (PROMETHAZINE -DM) 6.25-15 MG/5ML syrup, Take 5 mLs by mouth 4 (four) times daily as needed for cough. (Patient not taking: Reported on 02/26/2024), Disp: 118 mL, Rfl: 0  No Known Allergies  I personally reviewed active problem list, medication list, allergies, family history with the patient/caregiver today.   ROS  Ten systems reviewed and is negative except as mentioned in HPI    Objective Physical Exam  CONSTITUTIONAL: Patient appears well-developed and well-nourished. No distress. HEENT: Head atraumatic, normocephalic, neck supple. CARDIOVASCULAR: Normal rate, regular rhythm and normal heart sounds. No murmur heard. No BLE edema. Varicose veins on left lower leg PULMONARY: Effort normal and breath sounds normal. No respiratory distress. ABDOMINAL: There is no tenderness or distention. MUSCULOSKELETAL: Normal gait. Without gross motor or sensory deficit. PSYCHIATRIC: Patient has a normal mood and affect. Behavior is normal. Judgment and thought content normal.  Vitals:   02/26/24 0836  BP: 122/74  Pulse: 78  Resp: 16  SpO2: 100%  Weight: 241 lb 3.2 oz (109.4 kg)  Height: 6\' 3"  (1.905 m)    Body mass index is 30.15 kg/m.    PHQ2/9:    02/26/2024    8:32 AM 10/16/2023   10:52 AM 08/27/2023    3:38 PM 07/11/2023    2:34 PM 05/28/2023    1:53 PM  Depression  screen PHQ 2/9  Decreased Interest 0 0 0 0 0  Down, Depressed, Hopeless 0 0 0 0 0  PHQ - 2 Score 0 0 0 0 0  Altered sleeping 0 0 0 0 0  Tired, decreased energy 0 0 0 0 0  Change in appetite 0 0 0 0 0  Feeling bad or failure about yourself  0 0 0 0 0  Trouble concentrating 0 0 0 0 0  Moving slowly or fidgety/restless 0 0 0 0 0  Suicidal thoughts 0 0 0 0 0  PHQ-9 Score 0 0 0 0 0  Difficult doing work/chores Not difficult at all   Not difficult at all     phq 9 is negative  Fall Risk:    10/24/2023   11:26 AM 10/16/2023   10:52 AM 08/27/2023    3:38 PM 07/11/2023    2:32 PM 05/28/2023    1:53 PM  Fall Risk  Falls in the past year? 0 0 0 0 0  Number falls in past yr:    0   Injury with Fall?    0   Risk for fall due to : No Fall Risks No Fall Risks No Fall Risks No Fall Risks No Fall Risks  Follow up Falls prevention discussed Falls prevention discussed Falls prevention discussed Falls prevention discussed;Education provided;Falls evaluation completed Falls prevention discussed     Assessment & Plan   Obesity BMI slightly over 30 with 10 lb weight gain since last visit. - Encourage resumption of ketogenic diet and increased physical activity. - Advise maintaining a balanced diet and adequate hydration.  Attention-deficit hyperactivity disorder (ADHD) Currently on Adderall 10 mg twice daily with decreased efficacy. Discussed potential tachyphylaxis and schedule changes. Decision to increase dosage to 15 mg twice daily. - Increase Adderall to 15 mg twice daily. - Monitor for sleep disturbances and adjust second dose if necessary.  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 Occasional radiculopathy symptoms managed with muscle relaxants. Advised against surgery due to age and symptom management. - Continue muscle relaxants as needed for neck pain. - Advise  against heavy lifting and encourage smart lifting techniques.  Asthma Discussed PCV 20   History of colon polyp Due for repeat colonoscopy. Previous colonoscopy in 2019 found one polyp. Referral to gastroenterologist for surveillance colonoscopy. Discussed potential cost implications. - Refer to gastroenterologist for repeat colonoscopy. - Expect contact from [facility] clinic for scheduling.  Varicose veins Previous vascular consultation. No recent swelling, needs follow-up with vascular doctor after settling billing issues. - Advise to pay outstanding bill and schedule follow-up with vascular doctor.  General Health Maintenance Tetanus vaccination due since last received in 2013. Pneumonia vaccination recommended due to asthma. - Administer tetanus vaccination today. - Discussed pneumonia vaccination, patient declined.  Follow-up Follow-up in three months to assess response to increased Adderall dosage and overall health status. - Schedule follow-up appointment in three months.

## 2024-03-16 ENCOUNTER — Encounter (INDEPENDENT_AMBULATORY_CARE_PROVIDER_SITE_OTHER): Payer: Self-pay

## 2024-04-01 ENCOUNTER — Encounter: Payer: Self-pay | Admitting: Family Medicine

## 2024-05-28 ENCOUNTER — Ambulatory Visit: Admitting: Family Medicine

## 2024-05-28 VITALS — BP 116/80 | HR 90 | Resp 16 | Ht 75.0 in | Wt 239.0 lb

## 2024-05-28 DIAGNOSIS — I83892 Varicose veins of left lower extremities with other complications: Secondary | ICD-10-CM

## 2024-05-28 DIAGNOSIS — F900 Attention-deficit hyperactivity disorder, predominantly inattentive type: Secondary | ICD-10-CM

## 2024-05-28 MED ORDER — LISDEXAMFETAMINE DIMESYLATE 30 MG PO CAPS: 30.0000 mg | ORAL_CAPSULE | Freq: Every day | ORAL | 0 refills | Status: DC

## 2024-05-28 NOTE — Progress Notes (Signed)
 Name: Christian Hamilton   MRN: [id number]    DOB: February 11, 1986   Date:05/28/2024       Progress Note  Subjective  Chief Complaint  Chief Complaint  Patient presents with   Medical Management of Chronic Issues   Discussed the use of AI scribe software for clinical note transcription with the patient, who gave verbal consent to proceed.  History of Present Illness Christian Hamilton is a 38 year old male who presents for a three-month follow-up for ADD management.  He has been taking Adderall 15 mg twice a day for ADD but feels it is not as effective as desired. He is able to focus on work tasks but struggles with paperwork and maintaining attention on less engaging tasks. He has been on stimulants for a while and notes that they help him stay on track, particularly with work-related activities. He reflects that he likely had ADD since childhood, which affected his ability to study, although he still achieved A's and B's in school.  He mentions a recent incident where he dislocated his left shoulder and had to put it back in, which led to cramping. He took a muscle relaxer to alleviate the discomfort and aid sleep. He has not been using the muscle relaxer frequently.  He underwent a procedure for varicose veins, primarily on the left side, and reports satisfaction with the outcome. He notes a reduction in swelling and pain in the affected area.    Patient Active Problem List   Diagnosis Date Noted   Radiculitis, cervical 02/26/2024   Varicose veins of left lower extremity with inflammation 12/06/2022   Varicose veins with inflammation 09/09/2022   Overweight (BMI 25.0-29.9) 08/20/2022   Vitamin D  deficiency 08/20/2022   Ketogenic diet monitoring encounter 08/20/2022   DDD (degenerative disc disease), cervical 11/04/2017   Varicocele 07/25/2016   Neck pain 10/12/2015   Panic attack as reaction to stress 06/02/2015   ADD (attention deficit disorder) 05/30/2015   Back pain, chronic 05/30/2015    Chronic venous insufficiency 05/30/2015   Abnormal antinuclear antibody titer 05/30/2015   Varicose veins of left leg with edema 05/30/2015   Migraine without aura and responsive to treatment 05/10/2009   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   Obesity (BMI 30-39.9) 08/08/2008    Past Surgical History:  Procedure Laterality Date   COLONOSCOPY WITH PROPOFOL  N/A 07/23/2018   Procedure: COLONOSCOPY WITH PROPOFOL ;  Surgeon: Unk Corinn Skiff, MD;  Location: [facility] ENDOSCOPY;  Service: Gastroenterology;  Laterality: N/A;   LASER ABLATION Left 07/26/2023   saphenous vein   SHOULDER SURGERY     TONSILECTOMY, ADENOIDECTOMY, BILATERAL MYRINGOTOMY AND TUBES      Family History  Problem Relation Age of Onset   Kidney disease Father    ADD / ADHD Brother     Social History   Tobacco Use   Smoking status: Never   Smokeless tobacco: Never  Substance Use Topics   Alcohol use: Yes    Alcohol/week: 6.0 standard drinks of alcohol    Types: 6 Cans of beer per week     Current Outpatient Medications:    amphetamine -dextroamphetamine  (ADDERALL) 15 MG tablet, Take 1 tablet by mouth 2 (two) times daily with a meal., Disp: 180 tablet, Rfl: 0   cyanocobalamin  (VITAMIN B12) 1000 MCG tablet, Take 1,000 mcg by mouth daily., Disp: , Rfl:    tiZANidine  (ZANAFLEX ) 4 MG tablet, Take 0.5-1.5 tablets (2-6 mg total) by mouth every 8 (eight) hours as needed for muscle spasms (  muscle tightness)., Disp: 90 tablet, Rfl: 2   albuterol  (VENTOLIN  HFA) 108 (90 Base) MCG/ACT inhaler, Inhale 2 puffs into the lungs every 6 (six) hours as needed for wheezing or shortness of breath. (Patient not taking: Reported on 05/28/2024), Disp: 8 g, Rfl: 0  No Known Allergies  I personally reviewed active problem list, medication list, allergies with the patient/caregiver today.   ROS  Ten systems reviewed and is negative except as mentioned in HPI    Objective Physical Exam CONSTITUTIONAL: Patient appears  well-developed and well-nourished.  No distress. HEENT: Head atraumatic, normocephalic, neck supple. CARDIOVASCULAR: Normal rate, regular rhythm and normal heart sounds.  No murmur heard. No BLE edema. Varicose veins on both lower extremities  PULMONARY: Effort normal and breath sounds normal. No respiratory distress. ABDOMINAL: There is no tenderness or distention. MUSCULOSKELETAL: Normal gait. Without gross motor or sensory deficit. PSYCHIATRIC: Patient has a normal mood and affect. behavior is normal. Judgment and thought content normal.  Vitals:   05/28/24 0853  BP: 116/80  Pulse: 90  Resp: 16  SpO2: 98%  Weight: 239 lb (108.4 kg)  Height: 6' 3 (1.905 m)    Body mass index is 29.87 kg/m.    PHQ2/9:    05/28/2024    8:49 AM 02/26/2024    8:32 AM 10/16/2023   10:52 AM 08/27/2023    3:38 PM 07/11/2023    2:34 PM  Depression screen PHQ 2/9  Decreased Interest 0 0 0 0 0  Down, Depressed, Hopeless 0 0 0 0 0  PHQ - 2 Score 0 0 0 0 0  Altered sleeping  0 0 0 0  Tired, decreased energy  0 0 0 0  Change in appetite  0 0 0 0  Feeling bad or failure about yourself   0 0 0 0  Trouble concentrating  0 0 0 0  Moving slowly or fidgety/restless  0 0 0 0  Suicidal thoughts  0 0 0 0  PHQ-9 Score  0 0 0 0  Difficult doing work/chores  Not difficult at all   Not difficult at all    phq 9 is negative  Fall Risk:    05/28/2024    8:49 AM 10/24/2023   11:26 AM 10/16/2023   10:52 AM 08/27/2023    3:38 PM 07/11/2023    2:32 PM  Fall Risk   Falls in the past year? 0 0 0 0 0  Number falls in past yr: 0    0  Injury with Fall? 0    0  Risk for fall due to : No Fall Risks No Fall Risks No Fall Risks No Fall Risks No Fall Risks  Follow up Falls evaluation completed Falls prevention discussed Falls prevention discussed Falls prevention discussed Falls prevention discussed;Education provided;Falls evaluation completed     Assessment & Plan Attention-deficit hyperactivity disorder,  predominantly inattentive type Current Adderall regimen ineffective. Switching to Vyvanse  for longer duration and reduced crash risk. Generic Vyvanse  is cost-comparable. - Prescribe Vyvanse  30 mg daily. - Advise to take in the morning before work. - Instruct to send a message in a month if dose adjustment needed. - Schedule follow-up in three months.  Status post left varicose vein procedure Reports satisfaction with outcome. No swelling or pain, indicating success.  Left shoulder dislocation, self-reduced Self-reduced dislocation. Used muscle relaxer for cramping.

## 2024-07-06 ENCOUNTER — Encounter: Payer: Self-pay | Admitting: Family Medicine

## 2024-07-06 ENCOUNTER — Other Ambulatory Visit: Payer: Self-pay | Admitting: Family Medicine

## 2024-07-06 MED ORDER — AMPHETAMINE-DEXTROAMPHETAMINE 15 MG PO TABS: 15.0000 mg | ORAL_TABLET | Freq: Two times a day (BID) | ORAL | 0 refills | Status: DC

## 2024-07-14 NOTE — Patient Instructions (Signed)
 Preventive Care 11-40 Years Old, Male Preventive care refers to lifestyle choices and visits with your health care provider that can promote health and wellness. Preventive care visits are also called wellness exams. What can I expect for my preventive care visit? Counseling During your preventive care visit, your health care provider may ask about your: Medical history, including: Past medical problems. Family medical history. Current health, including: Emotional well-being. Home life and relationship well-being. Sexual activity. Lifestyle, including: Alcohol, nicotine or tobacco, and drug use. Access to firearms. Diet, exercise, and sleep habits. Safety issues such as seatbelt and bike helmet use. Sunscreen use. Work and work Astronomer. Physical exam Your health care provider may check your: Height and weight. These may be used to calculate your BMI (body mass index). BMI is a measurement that tells if you are at a healthy weight. Waist circumference. This measures the distance around your waistline. This measurement also tells if you are at a healthy weight and may help predict your risk of certain diseases, such as type 2 diabetes and high blood pressure. Heart rate and blood pressure. Body temperature. Skin for abnormal spots. What immunizations do I need?  Vaccines are usually given at various ages, according to a schedule. Your health care provider will recommend vaccines for you based on your age, medical history, and lifestyle or other factors, such as travel or where you work. What tests do I need? Screening Your health care provider may recommend screening tests for certain conditions. This may include: Lipid and cholesterol levels. Diabetes screening. This is done by checking your blood sugar (glucose) after you have not eaten for a while (fasting). Hepatitis B test. Hepatitis C test. HIV (human immunodeficiency virus) test. STI (sexually transmitted infection)  testing, if you are at risk. Talk with your health care provider about your test results, treatment options, and if necessary, the need for more tests. Follow these instructions at home: Eating and drinking  Eat a healthy diet that includes fresh fruits and vegetables, whole grains, lean protein, and low-fat dairy products. Drink enough fluid to keep your urine pale yellow. Take vitamin and mineral supplements as recommended by your health care provider. Do not drink alcohol if your health care provider tells you not to drink. If you drink alcohol: Limit how much you have to 0-2 drinks a day. Know how much alcohol is in your drink. In the [place] one drink equals one 12 oz bottle of beer (355 mL), one 5 oz glass of wine (148 mL), or one 1 oz glass of hard liquor (44 mL). Lifestyle Brush your teeth every morning and night with fluoride toothpaste. Floss one time each day. Exercise for at least 30 minutes 5 or more days each week. Do not use any products that contain nicotine or tobacco. These products include cigarettes, chewing tobacco, and vaping devices, such as e-cigarettes. If you need help quitting, ask your health care provider. Do not use drugs. If you are sexually active, practice safe sex. Use a condom or other form of protection to prevent STIs. Find healthy ways to manage stress, such as: Meditation, yoga, or listening to music. Journaling. Talking to a trusted person. Spending time with friends and family. Minimize exposure to UV radiation to reduce your risk of skin cancer. Safety Always wear your seat belt while driving or riding in a vehicle. Do not drive: If you have been drinking alcohol. Do not ride with someone who has been drinking. If you have been using any mind-altering substances  or drugs. While texting. When you are tired or distracted. Wear a helmet and other protective equipment during sports activities. If you have firearms in your house, make sure you  follow all gun safety procedures. Seek help if you have been physically or sexually abused. What's next? Go to your health care provider once a year for an annual wellness visit. Ask your health care provider how often you should have your eyes and teeth checked. Stay up to date on all vaccines. This information is not intended to replace advice given to you by your health care provider. Make sure you discuss any questions you have with your health care provider. Document Revised: 04/11/2021 Document Reviewed: 04/11/2021 Elsevier Patient Education  2024 [employer].

## 2024-07-15 ENCOUNTER — Other Ambulatory Visit: Payer: Self-pay

## 2024-07-15 ENCOUNTER — Encounter: Payer: Self-pay | Admitting: Family Medicine

## 2024-07-15 ENCOUNTER — Ambulatory Visit (INDEPENDENT_AMBULATORY_CARE_PROVIDER_SITE_OTHER): Admitting: Family Medicine

## 2024-07-15 VITALS — BP 120/78 | HR 73 | Resp 16 | Ht 73.5 in | Wt 239.5 lb

## 2024-07-15 DIAGNOSIS — Z0000 Encounter for general adult medical examination without abnormal findings: Secondary | ICD-10-CM

## 2024-07-15 DIAGNOSIS — Z13220 Encounter for screening for lipoid disorders: Secondary | ICD-10-CM

## 2024-07-15 DIAGNOSIS — Z131 Encounter for screening for diabetes mellitus: Secondary | ICD-10-CM

## 2024-07-15 DIAGNOSIS — E559 Vitamin D deficiency, unspecified: Secondary | ICD-10-CM

## 2024-07-15 MED ORDER — AMPHETAMINE-DEXTROAMPHETAMINE 15 MG PO TABS: 15.0000 mg | ORAL_TABLET | Freq: Two times a day (BID) | ORAL | 0 refills | Status: DC

## 2024-07-15 NOTE — Progress Notes (Signed)
 Name: Christian Hamilton   MRN: [id number]    DOB: [birth year]   Date:07/15/2024       Progress Note  Subjective  Chief Complaint  Chief Complaint  Patient presents with   Annual Exam    Pt refused male exam    HPI  Patient presents for annual CPE .  Diet: high protein diet Exercise: he has a physical job  Last Dental Exam: up to date  Last Eye Exam: up to date   Depression: phq 9 is negative    07/15/2024    8:14 AM 05/28/2024    8:49 AM 02/26/2024    8:32 AM 10/16/2023   10:52 AM 08/27/2023    3:38 PM  Depression screen PHQ 2/9  Decreased Interest 0 0 0 0 0  Down, Depressed, Hopeless 0 0 0 0 0  PHQ - 2 Score 0 0 0 0 0  Altered sleeping   0 0 0  Tired, decreased energy   0 0 0  Change in appetite   0 0 0  Feeling bad or failure about yourself    0 0 0  Trouble concentrating   0 0 0  Moving slowly or fidgety/restless   0 0 0  Suicidal thoughts   0 0 0  PHQ-9 Score   0 0 0  Difficult doing work/chores   Not difficult at all      Hypertension:  BP Readings from Last 3 Encounters:  07/15/24 120/78  05/28/24 116/80  02/26/24 122/74    Obesity: Wt Readings from Last 3 Encounters:  07/15/24 239 lb 8 oz (108.6 kg)  05/28/24 239 lb (108.4 kg)  02/26/24 241 lb 3.2 oz (109.4 kg)   BMI Readings from Last 3 Encounters:  07/15/24 31.17 kg/m  05/28/24 29.87 kg/m  02/26/24 30.15 kg/m     Flowsheet Row Office Visit [facility]/28/2024 [facility]  AUDIT-C Score 3    He has been cutting down.   Married STD testing and prevention (HIV/chl/gon/syphilis):  not applicable Sexual history: no problems Hep C Screening: completed Skin cancer: Discussed monitoring for atypical lesions, under the care of dermatologist  Colorectal cancer: already scheduled  Prostate cancer:  not applicable - father diagnosed with prostate cancer in his 26's   Lung cancer:  Low Dose CT Chest recommended if Age 88-80 years, 30 pack-year currently smoking OR have quit w/in  15years. Patient  is not a candidate for screening   AAA: The USPSTF recommends one-time screening with ultrasonography in men ages 5 to 75 years who have ever smoked. Patient   is not a candidate for screening  ECG:  2021  Vaccines: reviewed with the patient. Not interested on flu or covid   Advanced Care Planning: A voluntary discussion about advance care planning including the explanation and discussion of advance directives.  Discussed health care proxy and Living will, and the patient was able to identify a health care proxy as wife .  Patient does not have a living will and power of attorney of health care   Patient Active Problem List   Diagnosis Date Noted   Radiculitis, cervical 02/26/2024   Varicose veins of left lower extremity with inflammation 12/06/2022   Varicose veins with inflammation 09/09/2022   Overweight (BMI 25.0-29.9) 08/20/2022   Vitamin D  deficiency 08/20/2022   Ketogenic diet monitoring encounter 08/20/2022   DDD (degenerative disc disease), cervical 11/04/2017   Varicocele 07/25/2016   Neck pain 10/12/2015   Panic attack as  reaction to stress 06/02/2015   ADD (attention deficit disorder) 05/30/2015   Back pain, chronic 05/30/2015   Chronic venous insufficiency 05/30/2015   Abnormal antinuclear antibody titer 05/30/2015   Varicose veins of left leg with edema 05/30/2015   Migraine without aura and responsive to treatment 05/10/2009   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   Obesity (BMI 30-39.9) 08/08/2008    Past Surgical History:  Procedure Laterality Date   COLONOSCOPY WITH PROPOFOL  N/A 07/23/2018   Procedure: COLONOSCOPY WITH PROPOFOL ;  Surgeon: Unk Corinn Skiff, MD;  Location: [facility] ENDOSCOPY;  Service: Gastroenterology;  Laterality: N/A;   LASER ABLATION Left 07/26/2023   saphenous vein   SHOULDER SURGERY     TONSILECTOMY, ADENOIDECTOMY, BILATERAL MYRINGOTOMY AND TUBES      Family History  Problem Relation Age of Onset   Kidney  disease Father    ADD / ADHD Brother     Social History   Socioeconomic History   Marital status: Married    Spouse name: Verneita   Number of children: 3   Years of education: Not on file   Highest education level: Some college, no degree  Occupational History   Occupation: Production designer, theatre/television/film    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  Tobacco Use   Smoking status: Never   Smokeless tobacco: Never  Vaping Use   Vaping status: Never Used  Substance and Sexual Activity   Alcohol use: Yes    Alcohol/week: 6.0 standard drinks of alcohol    Types: 6 Cans of beer per week   Drug use: No   Sexual activity: Yes  Other Topics Concern   Not on file  Social History Narrative   He is married and has 3 children   Social Drivers of Corporate investment banker Strain: Low Risk  (05/28/2024)   Overall Financial Resource Strain (CARDIA)    Difficulty of Paying Living Expenses: Not hard at all  Food Insecurity: No Food Insecurity (05/28/2024)   Hunger Vital Sign    Worried About Running Out of Food in the Last Year: Never true    Ran Out of Food in the Last Year: Never true  Transportation Needs: No Transportation Needs (05/28/2024)   PRAPARE - Administrator, Civil Service (Medical): No    Lack of Transportation (Non-Medical): No  Physical Activity: Insufficiently Active (05/28/2024)   Exercise Vital Sign    Days of Exercise per Week: 3 days    Minutes of Exercise per Session: 30 min  Stress: No Stress Concern Present (05/28/2024)   [employer] of Occupational Health - Occupational Stress Questionnaire    Feeling of Stress: Not at all  Social Connections: Unknown (05/28/2024)   Social Connection and Isolation Panel    Frequency of Communication with Friends and Family: Patient declined    Frequency of Social Gatherings with Friends and Family: Patient declined    Attends Religious Services: Patient declined    Database administrator or Organizations: Patient declined    Attends Tax inspector Meetings: Not on file    Marital Status: Married  Intimate Partner Violence: Not At Risk (07/15/2024)   Humiliation, Afraid, Rape, and Kick questionnaire    Fear of Current or Ex-Partner: No    Emotionally Abused: No    Physically Abused: No    Sexually Abused: No     Current Outpatient Medications:    amphetamine -dextroamphetamine  (ADDERALL) 15 MG tablet, Take 1 tablet by mouth 2 (two) times daily., Disp: 60 tablet, Rfl: 0  tiZANidine  (ZANAFLEX ) 4 MG tablet, Take 0.5-1.5 tablets (2-6 mg total) by mouth every 8 (eight) hours as needed for muscle spasms (muscle tightness)., Disp: 90 tablet, Rfl: 2   albuterol  (VENTOLIN  HFA) 108 (90 Base) MCG/ACT inhaler, Inhale 2 puffs into the lungs every 6 (six) hours as needed for wheezing or shortness of breath. (Patient not taking: Reported on 07/15/2024), Disp: 8 g, Rfl: 0   cyanocobalamin  (VITAMIN B12) 1000 MCG tablet, Take 1,000 mcg by mouth daily. (Patient not taking: Reported on 07/15/2024), Disp: , Rfl:   No Known Allergies   ROS  Constitutional: Negative for fever or weight change.  Respiratory: Negative for cough and shortness of breath.   Cardiovascular: Negative for chest pain or palpitations.  Gastrointestinal: Negative for abdominal pain, no bowel changes.  Musculoskeletal: Negative for gait problem or joint swelling.  Skin: Negative for rash.  Neurological: Negative for dizziness or headache.  No other specific complaints in a complete review of systems (except as listed in HPI above).    Objective  Vitals:   07/15/24 0815  BP: 120/78  Pulse: 73  Resp: 16  SpO2: 100%  Weight: 239 lb 8 oz (108.6 kg)  Height: 6' 1.5 (1.867 m)    Body mass index is 31.17 kg/m.  Physical Exam  Constitutional: Patient appears well-developed and well-nourished. No distress.  HENT: Head: Normocephalic and atraumatic. Ears: B TMs ok, no erythema or effusion; Nose: Nose normal. Mouth/Throat: Oropharynx is clear and moist. No  oropharyngeal exudate.  Eyes: Conjunctivae and EOM are normal. Pupils are equal, round, and reactive to light. No scleral icterus.  Neck: Normal range of motion. Neck supple. No JVD present. No thyromegaly present.  Cardiovascular: Normal rate, regular rhythm and normal heart sounds.  No murmur heard. No varicose veins on left lower extremity  Pulmonary/Chest: Effort normal and breath sounds normal. No respiratory distress. Abdominal: Soft. Bowel sounds are normal, no distension. There is no tenderness. no masses MALE GENITALIA: Normal descended testes bilaterally, varicoceles bilaterally,  no hernias, no lesions, no discharge RECTAL:not done Musculoskeletal: Normal range of motion, no joint effusions. No gross deformities Neurological: he is alert and oriented to person, place, and time. No cranial nerve deficit. Coordination, balance, strength, speech and gait are normal.  Skin: Skin is warm and dry. No rash noted. No erythema.  Psychiatric: Patient has a normal mood and affect. behavior is normal. Judgment and thought content normal.     Assessment & Plan  1. Well adult exam (Primary)  - VITAMIN D  25 Hydroxy (Vit-D Deficiency, Fractures) - CBC with Differential/Platelet - Comprehensive metabolic panel with GFR - Lipid panel - Hemoglobin A1c - B12 and Folate Panel - TSH  2. Vitamin D  deficiency  - VITAMIN D  25 Hydroxy (Vit-D Deficiency, Fractures)  3. Diabetes mellitus screening  - Hemoglobin A1c  4. Lipid screening  - Lipid panel     -Prostate cancer screening and PSA options (with potential risks and benefits of testing vs not testing) were discussed along with recent recs/guidelines. -USPSTF grade A and B recommendations reviewed with patient; age-appropriate recommendations, preventive care, screening tests, etc discussed and encouraged; healthy living encouraged; see AVS for patient education given to patient -Discussed importance of 150 minutes of physical activity  weekly, eat two servings of fish weekly, eat one serving of tree nuts ( cashews, pistachios, pecans, almonds.SABRA) every other day, eat 6 servings of fruit/vegetables daily and drink plenty of water and avoid sweet beverages.  -Reviewed Health Maintenance: yes

## 2024-07-16 ENCOUNTER — Ambulatory Visit: Payer: Self-pay | Admitting: Family Medicine

## 2024-07-16 LAB — CBC WITH DIFFERENTIAL/PLATELET
Absolute Lymphocytes: 1683 {cells}/uL (ref 850–3900)
Absolute Monocytes: 400 {cells}/uL (ref 200–950)
Basophils Absolute: 42 {cells}/uL (ref 0–200)
Basophils Relative: 0.9 %
Eosinophils Absolute: 80 {cells}/uL (ref 15–500)
Eosinophils Relative: 1.7 %
HCT: 44.6 % (ref 38.5–50.0)
Hemoglobin: 15.1 g/dL (ref 13.2–17.1)
MCH: 32.7 pg (ref 27.0–33.0)
MCHC: 33.9 g/dL (ref 32.0–36.0)
MCV: 96.5 fL (ref 80.0–100.0)
MPV: 11.9 fL (ref 7.5–12.5)
Monocytes Relative: 8.5 %
Neutro Abs: 2496 {cells}/uL (ref 1500–7800)
Neutrophils Relative %: 53.1 %
Platelets: 160 Thousand/uL (ref 140–400)
RBC: 4.62 Million/uL (ref 4.20–5.80)
RDW: 11.6 % (ref 11.0–15.0)
Total Lymphocyte: 35.8 %
WBC: 4.7 Thousand/uL (ref 3.8–10.8)

## 2024-07-16 LAB — VITAMIN D 25 HYDROXY (VIT D DEFICIENCY, FRACTURES): Vit D, 25-Hydroxy: 35 ng/mL (ref 30–100)

## 2024-07-16 LAB — COMPREHENSIVE METABOLIC PANEL WITH GFR
AG Ratio: 2.4 (calc) (ref 1.0–2.5)
ALT: 14 U/L (ref 9–46)
AST: 15 U/L (ref 10–40)
Albumin: 4.6 g/dL (ref 3.6–5.1)
Alkaline phosphatase (APISO): 38 U/L (ref 36–130)
BUN: 22 mg/dL (ref 7–25)
CO2: 29 mmol/L (ref 20–32)
Calcium: 9.1 mg/dL (ref 8.6–10.3)
Chloride: 103 mmol/L (ref 98–110)
Creat: 0.7 mg/dL (ref 0.60–1.26)
Globulin: 1.9 g/dL (ref 1.9–3.7)
Glucose, Bld: 91 mg/dL (ref 65–99)
Potassium: 4.4 mmol/L (ref 3.5–5.3)
Sodium: 141 mmol/L (ref 135–146)
Total Bilirubin: 0.7 mg/dL (ref 0.2–1.2)
Total Protein: 6.5 g/dL (ref 6.1–8.1)
eGFR: 120 mL/min/1.73m2 (ref >=60)

## 2024-07-16 LAB — HEMOGLOBIN A1C
Hgb A1c MFr Bld: 5.4 % (ref <5.7)
Mean Plasma Glucose: 108 mg/dL
eAG (mmol/L): 6 mmol/L

## 2024-07-16 LAB — LIPID PANEL
Cholesterol: 166 mg/dL (ref <200)
HDL: 67 mg/dL (ref >=40)
LDL Cholesterol (Calc): 86 mg/dL
Non-HDL Cholesterol (Calc): 99 mg/dL (ref <130)
Total CHOL/HDL Ratio: 2.5 (calc) (ref <5.0)
Triglycerides: 47 mg/dL (ref <150)

## 2024-07-16 LAB — TSH: TSH: 0.93 m[IU]/L (ref 0.40–4.50)

## 2024-07-16 LAB — B12 AND FOLATE PANEL
Folate: 8.8 ng/mL
Vitamin B-12: 409 pg/mL (ref 200–1100)

## 2024-08-31 ENCOUNTER — Encounter: Payer: Self-pay | Admitting: Family Medicine

## 2024-08-31 ENCOUNTER — Ambulatory Visit: Admitting: Family Medicine

## 2024-08-31 VITALS — BP 116/74 | HR 75 | Resp 16 | Ht 73.5 in | Wt 244.1 lb

## 2024-08-31 DIAGNOSIS — E559 Vitamin D deficiency, unspecified: Secondary | ICD-10-CM | POA: Diagnosis not present

## 2024-08-31 DIAGNOSIS — F900 Attention-deficit hyperactivity disorder, predominantly inattentive type: Secondary | ICD-10-CM | POA: Diagnosis not present

## 2024-08-31 DIAGNOSIS — M5030 Other cervical disc degeneration, unspecified cervical region: Secondary | ICD-10-CM

## 2024-08-31 DIAGNOSIS — M5412 Radiculopathy, cervical region: Secondary | ICD-10-CM

## 2024-08-31 MED ORDER — AMPHETAMINE-DEXTROAMPHETAMINE 15 MG PO TABS: 15.0000 mg | ORAL_TABLET | Freq: Two times a day (BID) | ORAL | 0 refills | Status: DC

## 2024-08-31 NOTE — Progress Notes (Signed)
 Name: Christian Hamilton   MRN: [id number]    DOB: 1986-09-06   Date:08/31/2024       Progress Note  Subjective  Chief Complaint  Chief Complaint  Patient presents with   Medical Management of Chronic Issues   Discussed the use of AI scribe software for clinical note transcription with the patient, who gave verbal consent to proceed.  History of Present Illness Christian Hamilton is a 38 year old male who presents for a follow-up visit regarding ADD.  He has been taking Adderall 15 mg twice a day for a long time and reports that it works well for him without major side effects. He sometimes takes it on weekends depending on his needs, but he has not taken it today. He is currently on vacation and believes this may be why his blood pressure is lower.  He has a history of cervical radiculitis, causing neck pain that can radiate down both arms, especially after activities involving lifting or working above his head. He still experiences this pain occasionally, such as this morning. He has a muscle relaxer at home but does not feel the need for a refill at this time. No issues with his left shoulder.  He reports that his weight is probably about the same as before, though he has not been as strict with his diet recently. He describes his current diet as 'kind of lower carb' and 'eating pretty clean,' although he has not been strictly following a low-carb diet recently.  He underwent a procedure for varicose veins, which are still visible but not as severe or painful as before.  In September, his B12 level was 489, which was an improvement from the previous year. He has not been taking B12 supplements recently. His vitamin D  level was also checked and found to be normal, although he has a history of vitamin D  deficiency.    Patient Active Problem List   Diagnosis Date Noted   Radiculitis, cervical 02/26/2024   Vitamin D  deficiency 08/20/2022   Ketogenic diet monitoring encounter 08/20/2022   DDD  (degenerative disc disease), cervical 11/04/2017   Varicocele 07/25/2016   Neck pain 10/12/2015   Panic attack as reaction to stress 06/02/2015   ADD (attention deficit disorder) 05/30/2015   Back pain, chronic 05/30/2015   Chronic venous insufficiency 05/30/2015   Abnormal antinuclear antibody titer 05/30/2015   Varicose veins of left leg with edema 05/30/2015   Migraine without aura and responsive to treatment 05/10/2009   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   Obesity (BMI 30-39.9) 08/08/2008    Past Surgical History:  Procedure Laterality Date   COLONOSCOPY WITH PROPOFOL  N/A 07/23/2018   Procedure: COLONOSCOPY WITH PROPOFOL ;  Surgeon: Unk Corinn Skiff, MD;  Location: [facility] ENDOSCOPY;  Service: Gastroenterology;  Laterality: N/A;   LASER ABLATION Left 07/26/2023   saphenous vein   SHOULDER SURGERY     TONSILECTOMY, ADENOIDECTOMY, BILATERAL MYRINGOTOMY AND TUBES      Family History  Problem Relation Age of Onset   Kidney disease Father    ADD / ADHD Brother     Social History   Tobacco Use   Smoking status: Never   Smokeless tobacco: Never  Substance Use Topics   Alcohol use: Yes    Alcohol/week: 6.0 standard drinks of alcohol    Types: 6 Cans of beer per week     Current Outpatient Medications:    amphetamine -dextroamphetamine  (ADDERALL) 15 MG tablet, Take 1 tablet by mouth 2 (two) times daily.,  Disp: 60 tablet, Rfl: 0   tiZANidine  (ZANAFLEX ) 4 MG tablet, Take 0.5-1.5 tablets (2-6 mg total) by mouth every 8 (eight) hours as needed for muscle spasms (muscle tightness)., Disp: 90 tablet, Rfl: 2  No Known Allergies  I personally reviewed active problem list, medication list, allergies, family history with the patient/caregiver today.   ROS  Ten systems reviewed and is negative except as mentioned in HPI    Objective Physical Exam VITALS: BP- 116/74 MEASUREMENTS: Weight- 177 lbs, BMI- 31.0. CONSTITUTIONAL: Patient appears well-developed and  well-nourished. No distress. HEENT: Head atraumatic, normocephalic, neck supple. CARDIOVASCULAR: Normal rate, regular rhythm and normal heart sounds. No murmur heard. No BLE edema. PULMONARY: Effort normal and breath sounds normal. No respiratory distress. ABDOMINAL: There is no tenderness or distention. MUSCULOSKELETAL: Normal gait. Without gross motor or sensory deficit. PSYCHIATRIC: Patient has a normal mood and affect. Behavior is normal. Judgment and thought content normal.  Vitals:   08/31/24 0950  BP: 116/74  Pulse: 75  Resp: 16  SpO2: 99%  Weight: 244 lb 1.6 oz (110.7 kg)  Height: 6' 1.5 (1.867 m)    Body mass index is 31.77 kg/m.  Recent Results (from the past 2160 hours)  VITAMIN D  25 Hydroxy (Vit-D Deficiency, Fractures)     Status: None   Collection Time: 07/15/24  8:50 AM  Result Value Ref Range   Vit D, 25-Hydroxy 35 30 - 100 ng/mL    Comment: Vitamin D  Status         25-OH Vitamin D : . Deficiency:                    <20 ng/mL Insufficiency:             20 - 29 ng/mL Optimal:                 > or = 30 ng/mL . For 25-OH Vitamin D  testing on patients on  D2-supplementation and patients for whom quantitation  of D2 and D3 fractions is required, the QuestAssureD(TM) 25-OH VIT D, (D2,D3), LC/MS/MS is recommended: order  code 07111 (patients >59yrs). . See Note 1 . Note 1 . For additional information, please refer to  http://education.[facility].com/faq/FAQ199  (This link is being provided for informational/ educational purposes only.)   CBC with Differential/Platelet     Status: None   Collection Time: 07/15/24  8:50 AM  Result Value Ref Range   WBC 4.7 3.8 - 10.8 Thousand/uL   RBC 4.62 4.20 - 5.80 Million/uL   Hemoglobin 15.1 13.2 - 17.1 g/dL   HCT 55.3 61.4 - 49.9 %   MCV 96.5 80.0 - 100.0 fL   MCH 32.7 27.0 - 33.0 pg   MCHC 33.9 32.0 - 36.0 g/dL    Comment: For adults, a slight decrease in the calculated MCHC value (in the range of 30 to 32  g/dL) is most likely not clinically significant; however, it should be interpreted with caution in correlation with other red cell parameters and the patient's clinical condition.    RDW 11.6 11.0 - 15.0 %   Platelets 160 140 - 400 Thousand/uL   MPV 11.9 7.5 - 12.5 fL   Neutro Abs 2,496 1,500 - 7,800 cells/uL   Absolute Lymphocytes 1,683 850 - 3,900 cells/uL   Absolute Monocytes 400 200 - 950 cells/uL   Eosinophils Absolute 80 15 - 500 cells/uL   Basophils Absolute 42 0 - 200 cells/uL   Neutrophils Relative % 53.1 %   Total Lymphocyte 35.8 %  Monocytes Relative 8.5 %   Eosinophils Relative 1.7 %   Basophils Relative 0.9 %  Comprehensive metabolic panel with GFR     Status: None   Collection Time: 07/15/24  8:50 AM  Result Value Ref Range   Glucose, Bld 91 65 - 99 mg/dL    Comment: .            Fasting reference interval .    BUN 22 7 - 25 mg/dL   Creat 9.29 9.39 - 8.73 mg/dL   eGFR 879 > OR = 60 fO/fpw/8.26f7   BUN/Creatinine Ratio SEE NOTE: 6 - 22 (calc)    Comment:    Not Reported: BUN and Creatinine are within    reference range. .    Sodium 141 135 - 146 mmol/L   Potassium 4.4 3.5 - 5.3 mmol/L   Chloride 103 98 - 110 mmol/L   CO2 29 20 - 32 mmol/L   Calcium 9.1 8.6 - 10.3 mg/dL   Total Protein 6.5 6.1 - 8.1 g/dL   Albumin 4.6 3.6 - 5.1 g/dL   Globulin 1.9 1.9 - 3.7 g/dL (calc)   AG Ratio 2.4 1.0 - 2.5 (calc)   Total Bilirubin 0.7 0.2 - 1.2 mg/dL   Alkaline phosphatase (APISO) 38 36 - 130 U/L   AST 15 10 - 40 U/L   ALT 14 9 - 46 U/L  Lipid panel     Status: None   Collection Time: 07/15/24  8:50 AM  Result Value Ref Range   Cholesterol 166 <200 mg/dL   HDL 67 > OR = 40 mg/dL   Triglycerides 47 <849 mg/dL   LDL Cholesterol (Calc) 86 mg/dL (calc)    Comment: Reference range: <100 . Desirable range <100 mg/dL for primary prevention;   <70 mg/dL for patients with CHD or diabetic patients  with > or = 2 CHD risk factors. SABRA LDL-C is now calculated using the  Martin-Hopkins  calculation, which is a validated novel method providing  better accuracy than the Friedewald equation in the  estimation of LDL-C.  Gladis APPLETHWAITE et al. SANDREA. 7986;689(80): 2061-2068  (http://education.[facility].com/faq/FAQ164)    Total CHOL/HDL Ratio 2.5 <5.0 (calc)   Non-HDL Cholesterol (Calc) 99 <869 mg/dL (calc)    Comment: For patients with diabetes plus 1 major ASCVD risk  factor, treating to a non-HDL-C goal of <100 mg/dL  (LDL-C of <29 mg/dL) is considered a therapeutic  option.   Hemoglobin A1c     Status: None   Collection Time: 07/15/24  8:50 AM  Result Value Ref Range   Hgb A1c MFr Bld 5.4 <5.7 %    Comment: For the purpose of screening for the presence of diabetes: . <5.7%       Consistent with the absence of diabetes 5.7-6.4%    Consistent with increased risk for diabetes             (prediabetes) > or =6.5%  Consistent with diabetes . This assay result is consistent with a decreased risk of diabetes. . Currently, no consensus exists regarding use of hemoglobin A1c for diagnosis of diabetes in children. . According to American Diabetes Association (ADA) guidelines, hemoglobin A1c <7.0% represents optimal control in non-pregnant diabetic patients. Different metrics may apply to specific patient populations.  Standards of Medical Care in Diabetes(ADA). .    Mean Plasma Glucose 108 mg/dL   eAG (mmol/L) 6.0 mmol/L  B12 and Folate Panel     Status: None   Collection Time: 07/15/24  8:50  AM  Result Value Ref Range   Vitamin B-12 409 200 - 1,100 pg/mL   Folate 8.8 ng/mL    Comment:                            Reference Range                            Low:           <3.4                            Borderline:    3.4-5.4                            Normal:        >5.4 .   TSH     Status: None   Collection Time: 07/15/24  8:50 AM  Result Value Ref Range   TSH 0.93 0.40 - 4.50 mIU/L     PHQ2/9:    08/31/2024    9:47 AM 07/15/2024     8:14 AM 05/28/2024    8:49 AM 02/26/2024    8:32 AM 10/16/2023   10:52 AM  Depression screen PHQ 2/9  Decreased Interest 0 0 0 0 0  Down, Depressed, Hopeless 0 0 0 0 0  PHQ - 2 Score 0 0 0 0 0  Altered sleeping    0 0  Tired, decreased energy    0 0  Change in appetite    0 0  Feeling bad or failure about yourself     0 0  Trouble concentrating    0 0  Moving slowly or fidgety/restless    0 0  Suicidal thoughts    0 0  PHQ-9 Score    0 0  Difficult doing work/chores    Not difficult at all     phq 9 is negative  Fall Risk:    08/31/2024    9:46 AM 07/15/2024    8:14 AM 05/28/2024    8:49 AM 10/24/2023   11:26 AM 10/16/2023   10:52 AM  Fall Risk   Falls in the past year? 0 0 0 0 0  Number falls in past yr: 0 0 0    Injury with Fall? 0 0 0    Risk for fall due to : No Fall Risks No Fall Risks No Fall Risks No Fall Risks No Fall Risks  Follow up Falls evaluation completed Falls evaluation completed Falls evaluation completed Falls prevention discussed Falls prevention discussed     Assessment & Plan Attention-deficit hyperactivity disorder, predominantly inattentive type ADD well-managed with Adderall 15 mg twice daily. No major side effects. Prefers Adderall over Vyvanse . - Prescribed Adderall 15 mg twice daily for 90 days.  Cervical radiculopathy and cervical disc degeneration Intermittent neck pain with radiation to arms, managed with muscle relaxers. - Continue current management with muscle relaxers as needed.  History of vitamin D  deficiency Plans to resume supplementation in winter. - Advised to resume vitamin D  supplementation daily during winter months.  General Health Maintenance Routine health maintenance discussed. Declined flu vaccination due to concerns about exposure to germs.

## 2024-12-01 ENCOUNTER — Encounter: Payer: Self-pay | Admitting: Family Medicine

## 2024-12-01 ENCOUNTER — Ambulatory Visit: Admitting: Family Medicine

## 2024-12-01 VITALS — BP 122/76 | HR 73 | Resp 16 | Ht 73.5 in | Wt 242.6 lb

## 2024-12-01 DIAGNOSIS — F900 Attention-deficit hyperactivity disorder, predominantly inattentive type: Secondary | ICD-10-CM

## 2024-12-01 DIAGNOSIS — M62838 Other muscle spasm: Secondary | ICD-10-CM | POA: Diagnosis not present

## 2024-12-01 DIAGNOSIS — E559 Vitamin D deficiency, unspecified: Secondary | ICD-10-CM

## 2024-12-01 MED ORDER — TIZANIDINE HCL 4 MG PO TABS: 4.0000 mg | ORAL_TABLET | Freq: Three times a day (TID) | ORAL | 0 refills | Status: AC | PRN

## 2024-12-01 MED ORDER — AMPHETAMINE-DEXTROAMPHETAMINE 15 MG PO TABS: 15.0000 mg | ORAL_TABLET | Freq: Two times a day (BID) | ORAL | 0 refills | Status: AC

## 2024-12-01 NOTE — Progress Notes (Signed)
 Name: Christian Hamilton   MRN: [id number]    DOB: 1986-05-05   Date:12/01/2024       Progress Note  Subjective  Chief Complaint  Chief Complaint  Patient presents with   Medical Management of Chronic Issues   Discussed the use of AI scribe software for clinical note transcription with the patient, who gave verbal consent to proceed.  History of Present Illness Christian Hamilton is a 39 year old male who presents for an ADD follow-up and shoulder pain management.  He sustained a shoulder injury approximately a month and a half ago, likely from lifting heavy tires at work. The pain was severe, radiating to his neck, and reminiscent of pain experienced after a previous rotator cuff surgery. Despite having full range of motion, the pain was intense and unresponsive to pain medication. He has been using a brace, which has helped, and recently managed to work without it for the first time without pain. He states since pain has finally resolved prefers not having further evaluation at this time   He is currently taking Adderall 15 mg twice daily for ADD, which he finds effective. No side effects such as tachycardia. He is aware it is a controlled medication. His bp has been well controlled  He also has a prescription for tizanidine , a muscle relaxer, which he uses occasionally for shoulder spasms. He notes that he has about ten pills left from an old prescription.  He reports occasional back pain but states that his back pain has not been a significant issue recently. He previously had varicose veins treated on his left leg and reports no current pain or swelling in that area.  Socially, his children are returning to school after a long winter break, and he has been managing his weight by eating cleaner after feeling he gained weight over the holidays.    Patient Active Problem List   Diagnosis Date Noted   Radiculitis, cervical 02/26/2024   Vitamin D  deficiency 08/20/2022   Ketogenic diet monitoring  encounter 08/20/2022   DDD (degenerative disc disease), cervical 11/04/2017   Varicocele 07/25/2016   Neck pain 10/12/2015   Panic attack as reaction to stress 06/02/2015   ADD (attention deficit disorder) 05/30/2015   Back pain, chronic 05/30/2015   Chronic venous insufficiency 05/30/2015   Abnormal antinuclear antibody titer 05/30/2015   Varicose veins of left leg with edema 05/30/2015   Migraine without aura and responsive to treatment 05/10/2009   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   Obesity (BMI 30-39.9) 08/08/2008    Past Surgical History:  Procedure Laterality Date   COLONOSCOPY WITH PROPOFOL  N/A 07/23/2018   Procedure: COLONOSCOPY WITH PROPOFOL ;  Surgeon: Unk Corinn Skiff, MD;  Location: [facility] ENDOSCOPY;  Service: Gastroenterology;  Laterality: N/A;   LASER ABLATION Left 07/26/2023   saphenous vein   SHOULDER SURGERY     TONSILECTOMY, ADENOIDECTOMY, BILATERAL MYRINGOTOMY AND TUBES      Family History  Problem Relation Age of Onset   Kidney disease Father    ADD / ADHD Brother     Social History   Tobacco Use   Smoking status: Never   Smokeless tobacco: Never  Substance Use Topics   Alcohol use: Yes    Alcohol/week: 6.0 standard drinks of alcohol    Types: 6 Cans of beer per week    Current Medications[1]  Allergies[2]  I personally reviewed active problem list, medication list, allergies, family history with the patient/caregiver today.   ROS  Ten systems  reviewed and is negative except as mentioned in HPI    Objective Physical Exam  CONSTITUTIONAL: Patient appears well-developed and well-nourished.  No distress. HEENT: Head atraumatic, normocephalic, neck supple. CARDIOVASCULAR: Normal rate, regular rhythm and normal heart sounds.  No murmur heard. No BLE edema. PULMONARY: Effort normal and breath sounds normal. No respiratory distress. ABDOMINAL: There is no tenderness or distention. MUSCULOSKELETAL: Normal gait. Without gross  motor or sensory deficit. PSYCHIATRIC: Patient has a normal mood and affect. behavior is normal. Judgment and thought content normal.  Vitals:   12/01/24 0830  BP: 122/76  Pulse: 73  Resp: 16  SpO2: 98%  Weight: 242 lb 9.6 oz (110 kg)  Height: 6' 1.5 (1.867 m)    Body mass index is 31.57 kg/m.   PHQ2/9:    12/01/2024    8:30 AM 08/31/2024    9:47 AM 07/15/2024    8:14 AM 05/28/2024    8:49 AM 02/26/2024    8:32 AM  Depression screen PHQ 2/9  Decreased Interest 0 0 0 0 0  Down, Depressed, Hopeless 0 0 0 0 0  PHQ - 2 Score 0 0 0 0 0  Altered sleeping     0  Tired, decreased energy     0  Change in appetite     0  Feeling bad or failure about yourself      0  Trouble concentrating     0  Moving slowly or fidgety/restless     0  Suicidal thoughts     0  PHQ-9 Score     0   Difficult doing work/chores     Not difficult at all     Data saved with a previous flowsheet row definition    phq 9 is negative  Fall Risk:    12/01/2024    8:30 AM 08/31/2024    9:46 AM 07/15/2024    8:14 AM 05/28/2024    8:49 AM 10/24/2023   11:26 AM  Fall Risk   Falls in the past year? 0 0 0 0 0  Number falls in past yr: 0 0 0 0   Injury with Fall? 0 0  0  0    Risk for fall due to : No Fall Risks No Fall Risks No Fall Risks No Fall Risks No Fall Risks  Follow up Falls evaluation completed Falls evaluation completed Falls evaluation completed Falls evaluation completed Falls prevention discussed     Data saved with a previous flowsheet row definition      Assessment & Plan Attention-deficit hyperactivity disorder, predominantly inattentive type ADHD well-managed with current Adderall regimen. He is satisfied with dosing schedule. - Prescribed Adderall 15 mg oral bid for 90 days.  Shoulder pain on left - improved   Cervical radiculopathy and cervical disc degeneration with muscle spasm Intermittent muscle spasms improved with shoulder brace and tizanidine . Symptoms possibly related to  repetitive motion and lifting. - Prescribed Tizanidine  4 mg oral every 8 hours as needed for muscle spasms, 90-day supply without refills.       [1]  Current Outpatient Medications:    amphetamine -dextroamphetamine  (ADDERALL) 15 MG tablet, Take 1 tablet by mouth 2 (two) times daily., Disp: 180 tablet, Rfl: 0   tiZANidine  (ZANAFLEX ) 4 MG tablet, Take 0.5-1.5 tablets (2-6 mg total) by mouth every 8 (eight) hours as needed for muscle spasms (muscle tightness)., Disp: 90 tablet, Rfl: 2 [2] No Known Allergies

## 2025-01-31 ENCOUNTER — Ambulatory Visit: Admitting: Family Medicine

## 2025-03-23 ENCOUNTER — Ambulatory Visit: Payer: BC Managed Care – PPO | Admitting: Dermatology

## 2025-07-18 ENCOUNTER — Encounter: Admitting: Family Medicine
